# Patient Record
Sex: Female | Born: 1940
Health system: Southern US, Community
[De-identification: ages and names within clinical notes are randomized; demographics above are authoritative.]

## PROBLEM LIST (undated history)

## (undated) DIAGNOSIS — T7840XA Allergy, unspecified, initial encounter: Secondary | ICD-10-CM

## (undated) DIAGNOSIS — I10 Essential (primary) hypertension: Secondary | ICD-10-CM

## (undated) DIAGNOSIS — E785 Hyperlipidemia, unspecified: Secondary | ICD-10-CM

## (undated) HISTORY — PX: REPLACEMENT TOTAL KNEE BILATERAL: SUR1225

## (undated) HISTORY — PX: CATARACT EXTRACTION W/ INTRAOCULAR LENS  IMPLANT, BILATERAL: SHX1307

## (undated) HISTORY — DX: Allergy, unspecified, initial encounter: T78.40XA

## (undated) HISTORY — DX: Essential (primary) hypertension: I10

## (undated) HISTORY — DX: Hyperlipidemia, unspecified: E78.5

---

## 1998-07-08 HISTORY — PX: OTHER SURGICAL HISTORY: SHX169

## 1998-07-19 ENCOUNTER — Encounter: Payer: Self-pay | Admitting: Gynecology

## 1998-07-26 ENCOUNTER — Encounter (INDEPENDENT_AMBULATORY_CARE_PROVIDER_SITE_OTHER): Payer: Self-pay

## 1998-07-26 ENCOUNTER — Ambulatory Visit (HOSPITAL_COMMUNITY): Admission: RE | Admit: 1998-07-26 | Discharge: 1998-07-26 | Payer: Self-pay | Admitting: Gynecology

## 1998-07-28 ENCOUNTER — Encounter: Admission: RE | Admit: 1998-07-28 | Discharge: 1998-10-26 | Payer: Self-pay | Admitting: Radiation Oncology

## 1999-05-23 ENCOUNTER — Encounter: Admission: RE | Admit: 1999-05-23 | Discharge: 1999-05-23 | Payer: Self-pay | Admitting: Gynecology

## 1999-05-23 ENCOUNTER — Encounter: Payer: Self-pay | Admitting: Gynecology

## 1999-06-13 ENCOUNTER — Other Ambulatory Visit: Admission: RE | Admit: 1999-06-13 | Discharge: 1999-06-13 | Payer: Self-pay | Admitting: Gynecology

## 2000-09-24 ENCOUNTER — Other Ambulatory Visit: Admission: RE | Admit: 2000-09-24 | Discharge: 2000-09-24 | Payer: Self-pay | Admitting: Gynecology

## 2000-11-26 ENCOUNTER — Encounter: Admission: RE | Admit: 2000-11-26 | Discharge: 2000-11-26 | Payer: Self-pay | Admitting: Gynecology

## 2000-11-26 ENCOUNTER — Encounter: Payer: Self-pay | Admitting: Gynecology

## 2001-05-14 ENCOUNTER — Ambulatory Visit (HOSPITAL_COMMUNITY): Admission: RE | Admit: 2001-05-14 | Discharge: 2001-05-14 | Payer: Self-pay | Admitting: Ophthalmology

## 2001-09-30 ENCOUNTER — Other Ambulatory Visit: Admission: RE | Admit: 2001-09-30 | Discharge: 2001-09-30 | Payer: Self-pay | Admitting: Gynecology

## 2002-10-27 ENCOUNTER — Other Ambulatory Visit: Admission: RE | Admit: 2002-10-27 | Discharge: 2002-10-27 | Payer: Self-pay | Admitting: Gynecology

## 2003-07-17 ENCOUNTER — Inpatient Hospital Stay (HOSPITAL_COMMUNITY): Admission: RE | Admit: 2003-07-17 | Discharge: 2003-07-22 | Payer: Self-pay | Admitting: Specialist

## 2003-09-28 ENCOUNTER — Encounter: Admission: RE | Admit: 2003-09-28 | Discharge: 2003-09-28 | Payer: Self-pay | Admitting: Gynecology

## 2003-12-21 ENCOUNTER — Other Ambulatory Visit: Admission: RE | Admit: 2003-12-21 | Discharge: 2003-12-21 | Payer: Self-pay | Admitting: Gynecology

## 2004-05-24 ENCOUNTER — Inpatient Hospital Stay (HOSPITAL_COMMUNITY): Admission: RE | Admit: 2004-05-24 | Discharge: 2004-05-27 | Payer: Self-pay | Admitting: Specialist

## 2004-10-04 ENCOUNTER — Encounter: Admission: RE | Admit: 2004-10-04 | Discharge: 2004-10-04 | Payer: Self-pay | Admitting: Endocrinology

## 2005-03-20 ENCOUNTER — Other Ambulatory Visit: Admission: RE | Admit: 2005-03-20 | Discharge: 2005-03-20 | Payer: Self-pay | Admitting: Gynecology

## 2006-04-16 ENCOUNTER — Encounter: Admission: RE | Admit: 2006-04-16 | Discharge: 2006-04-16 | Payer: Self-pay | Admitting: Gynecology

## 2006-06-25 ENCOUNTER — Other Ambulatory Visit: Admission: RE | Admit: 2006-06-25 | Discharge: 2006-06-25 | Payer: Self-pay | Admitting: Gynecology

## 2007-07-08 ENCOUNTER — Encounter: Admission: RE | Admit: 2007-07-08 | Discharge: 2007-07-08 | Payer: Self-pay | Admitting: Endocrinology

## 2008-10-26 ENCOUNTER — Encounter: Admission: RE | Admit: 2008-10-26 | Discharge: 2008-10-26 | Payer: Self-pay | Admitting: Endocrinology

## 2008-11-02 ENCOUNTER — Encounter: Admission: RE | Admit: 2008-11-02 | Discharge: 2008-11-02 | Payer: Self-pay | Admitting: Endocrinology

## 2010-02-26 ENCOUNTER — Encounter: Payer: Self-pay | Admitting: Endocrinology

## 2010-02-28 ENCOUNTER — Encounter: Payer: Self-pay | Admitting: Endocrinology

## 2010-03-07 ENCOUNTER — Encounter
Admission: RE | Admit: 2010-03-07 | Discharge: 2010-03-07 | Payer: PPO | Source: Home / Self Care | Attending: Endocrinology | Admitting: Endocrinology

## 2010-06-24 NOTE — Op Note (Signed)
NAME:  Breanna Shields, FRIDDLE NO.:  [id number]   MEDICAL RECORD NO.:  [id number]                   PATIENT TYPE:  INP   LOCATION:  0006                                 FACILITY:  [facility]   PHYSICIAN:  Erasmo Leventhal, M.D.         DATE OF BIRTH:  Dec 24, 1940   DATE OF PROCEDURE:  07/17/2003  DATE OF DISCHARGE:                                 OPERATIVE REPORT   PREOPERATIVE DIAGNOSIS:  Right knee end-stage osteoarthritis with  osteonecrosis, medial tibial plateau.   POSTOPERATIVE DIAGNOSIS:  Right knee end-stage osteoarthritis with  osteonecrosis, medial tibial plateau.   PROCEDURE:  Total knee arthroplasty.   SURGEON:  Erasmo Leventhal, M.D.   ASSISTANT:  Chapman Moss, PA-C.   ANESTHESIA:  Epidural with monitored anesthesia care.   ESTIMATED BLOOD LOSS:  Less than 50 cc.   DRAINS:  Two medium Hemovacs.   COMPLICATIONS:  None.   TOURNIQUET TIME:  One hour and 30 minutes at 350 mmHg.   COMPLICATIONS:  None.   DISPOSITION:  To the PACU stable.   OPERATIVE IMPLANTS:  Osteonics components, size 7 femur, size 5 tibia.  A 10  mm posterior stabilized tibial insert.  A size 26 mm polyethylene patella.  All cemented posterior stabilized.   OPERATIVE DETAIL:  The patient is counseled in the holding area.  The  correct side was identified.  The IV was started, and antibiotics given.  An  epidural catheter was placed by Dr. __________ .  She was admitted to the  operating room.  Placed in position.  Monitored anesthesia care.  A Foley  catheter was placed utilizing a sterile technique by the OR circulating  nurse.  The right knee was examined.  She had a 5 degree flexion  contracture, and she had range of motion to about 125 degrees.  She was  elevated and prepped and draped in a sterile fashion.  Exsanguinated with  esmarch.  The tourniquet was inflated to 350 mmHg.   A straight midline incision was made in the skin and subcutaneous tissue.  She had a rather thick adipose layer at that point in time.  The wound was  taken down with electrocautery.  Meticulous hemostasis was obtained.  A  medial parapatellar arthrotomy was performed.  The patella was everted, and  the knee was flexed.  A positive medial soft tissue release was done.  She  had end-stage arthritic change with bone-against-bone contact in all three  compartments at the distal point.  Cruciate ligaments were resected.  Starting hole was made through the femur.  The canal was irrigated.  Effluent was clear.  The intramedullary rod was gently placed.  I chose to  take a 10 mm cut off the distal femur, and this was done.  The distal femur  was found to be a size 7.  Rotation wires were made and cut to a size 7.  Tibial osteophytes were removed.  Menisci removed  under direct  visualization.  Significant vessels were coagulated.  Posterior  neurovascular structures were followed out and protected throughout the  entire case.  The proximal tibia is found to be a size 5.  A stepper was  utilized, and the canal was irrigated with effluent until clear.  __________  was gently placed.  I initially took a 0 degree slope and took a 10 mm cut  base from the lateral side; however, at this point, there was still some  bone that appeared to unhealthy on the medial side; therefore, another 2 mm  was taken.  Resecting the area of the osteonecrosis proximally to the medial  tibial plateau back to normal-appearing bone.  There was a small bone cyst  there, and it was curetted to add an extra locking hole for the cement.  Posterior medial and posterior femoral osteophyte were removed under direct  visualization.  A femoral trochlea was prepared in the standard fashion.  At  this time, a size 7 femur and size 5 tibia with a 10 mm flex insert with  excellent range of motion and soft tissue balance on flexion and extension.  Rotational marks were made.  Alignment was excellent.  The depth  of  the  keel was performed at this point in time.  The patella was found to be a  size 26.  It reamed to an appropriate depth.  It was of sufficient  thickness.  Again, it was found to be a size 26.  Locking holes were made.  Excess bone was removed.  The knee was then copiously irrigated with  pulsatile lavage at this point in time while the cement was mixed.  Utilizing model cement technique, all components were cemented into place.  A size 5 tibia, size 7 femur, and a 26 patella.  After the cement had cured,  I did 10 and 12 mm thick trials with a 10 mm tibial insert, posteriorly  stabilized.  We had excellent range of motion on flexion and extension.  She  was well balanced on flexion and extension.  The varus and valgus stress was  nice and stable.  The patellofemoral track was anatomic without a lateral  release.  I was very pleased with the knee at this point in time.  The  tibial trial was removed.  Excess cement was moved from the exposed areas,  and a final 10 mm thick flexed posterior stabilized tibial insert was then  implanted.  Bone wax was placed in the exposed bony surfaces.  The knee was  irrigated again with quite a bit of fluid, including antibiotic solution.  Two mini-vac drains were placed.  The sequential closure layers were done.  Arthrotomy Vicryl, subcu Vicryl, and the skin was closed with subcuticular  Monocryl suture.  Steri-Strips were applied.  Gelfoam around the drain.  Sterile compressive dressing applied.  The tourniquet was deflated.  Normal  circulation in the foot and ankle at the end of the case.  Drains were  hooked to suction.  Given another gram of Ancef intravenously.  Gently  awakened in the operating room and taken to the PACU in stable condition.  Patient  tolerated the procedure well.  There were no complications.  Sponge  and needle instrument counts were correct.  Decreased surgical time helped out the entire surgical procedure.  Mr.  Viviann Spare  Chapin's assistance was needed.  Erasmo Leventhal, M.D.    RAC/MEDQ  D:  07/17/2003  T:  07/17/2003  Job:  [id number]

## 2010-06-24 NOTE — Op Note (Signed)
NAMEWEDNESDAY, ERICSSON NO.:  [id number]   MEDICAL RECORD NO.:  [id number]          PATIENT TYPE:  INP   LOCATION:  0006                         FACILITY:  [facility]   PHYSICIAN:  Erasmo Leventhal, M.D.DATE OF BIRTH:  09/14/1940   DATE OF PROCEDURE:  05/24/2004  DATE OF DISCHARGE:                                 OPERATIVE REPORT   PREOPERATIVE DIAGNOSIS:  Left knee end-stage osteoarthritis.   POSTOPERATIVE DIAGNOSIS:  Left knee end-stage osteoarthritis.   PROCEDURE:  Left total knee arthroplasty.   SURGEON:  Erasmo Leventhal, M.D.   ASSISTANT:  Jaquelyn Bitter. Chabon, PA-C.   ANESTHESIA:  Femoral nerve block with spinal.   ESTIMATED BLOOD LOSS:  Less than 100 cc.   DRAINS:  Two medium Hemovac.   COMPLICATIONS:  None.   TOURNIQUET TIME:  One hour and 50 minutes at 350 mmHg.   OPERATIVE IMPLANTS:  [employer], [place], rotating platform, a size 3  femur, size 2.5 tibia, 10 mm rotating platform, posterior stabilized tibial  insert, and a 32 mm patella, all cemented.   OPERATIVE DETAILS:  The patient was counseled in the holding area.  The  correct side was identified.  IV was started.  Antibiotics were given.  Taken to OR where the femoral nerve block was administered by the  anesthesiologist in the holding area.  She was taken to the operating room,  where spinal anesthetic was now administered.  A Foley catheter was placed  utilizing sterile technique by one of the circulating nurses.  All  extremities were well padded.  The left knee was examined.  She had about a  10 degree flexion contracture.  This was flexed to 115 degrees.  Elevated.  Prepped with Duraprep.  Draped through a sterile fashion.  Exsanguinated  with esmarch.  The tourniquet was inflated to 350 mmHg.  A straight and  midline incision made through the skin and subcutaneous tissues.  Small  veins were electrocoagulated.  Medial and lateral soft tissue flaps were  developed at the  appropriate level.  Medial parapatellar arthrotomy was  performed.  A proximal medial soft tissue release was done.  The medial and  lateral menisci were removed under direct visualization using  electrocautery.  __________  vessels were coagulated.  The posterior  neurovascular structures were tied off and protected throughout the entire  case.  She had end-stage arthritic change, bone-against-bone contacts.  Cruciate ligaments were resected.  Starting hole made in the distal femur.  The canal was irrigated.  Effluent was clear.  Intramedullary rod was gently  placed.  I chose a 5 degree valgus cut.  Made an 11 mm cut off the distal  femur to compensate reflection contracture.  The distal femur was found to  be a size 3.  Rotational marks were made.  The cutting block was applied.  The distal femur was cut and fitted to a size #3.  The tibial eminence was  resected.  Osteophytes were removed in the proximal middle tibia.  The  proximal tibia was found to be a size 2.5.  A center starting hole  was made.  A step reamer was utilized.  The canal is irrigated until the effluent was  clear.  The intramedullary rod was gently placed.  I chose a 10 mm cut based  upon the lateral thigh, which was the high side.  The posterior medial and  posterolateral femoral osteophytes were removed under direct visualization.  The femoral box was then prepared.  At this time, the size 3 femur and size  2.5 tibia, were tightened, flexion and extension; therefore, the flex trial  was removed.  We took another 2 mm off the proximal tibia.  At this time,  again with a size 3 femur, size 2.5 tibia with a 10 mm insert with excellent  range of motion and soft tissue balance of flexion and extension.  Well  pleased at this time.  The patella was found to be 22 mm thick.  We took off  8 mm resection.  Found to be a size 32.  Locking holes were made at this  time with a size 32 patella.  We had excellent range of motion.   The  patellofemoral tracking was anatomic.  All trials were then removed.  The  tibial punch and keel were then performed.  Utilizing __________ , all  components were cemented into place.  The knee was copiously irrigated with  pulsatile lavage.  The cement was mixed on the back table.  At this time, we  cemented in a size 2.5 rotating platform tibia, a size 3 posterior  stabilized femur with a 32 mm patella.  After the cement had cured, the  excess cement was removed.  With a 10 mm trial, we had excellent flexion and  extension gaps, well balanced with varus and valgus stress, and  patellofemoral tracking was anatomic.  The trial was then removed.  Excess  cement was removed.  A final 10 mm posterior stabilized rotating platform  tibial insert was then planted.  The knee was well balanced.  There was no  spinout at all.  She was well constrained in the AP and lateral planes, and  patellofemoral tracking was anatomic.  The knee was copiously irrigated.  Two medium Hemovac drains were placed and left intra-articular.  __________  sequential closure of layers was done.  Arthrotomy Vicryl.  Subcu Vicryl.  Skin closed with subcu micro suture.  Knee joint and soft tissue irrigated  with saline during the closure of each layer.  Sterile dressing was applied.  The tourniquet was deflated, then 1 gm of Ancef was given intravenously.  A  sterile compressive dressing was applied.  She had normal circulation of the  foot and ankle at the end of the case.  Tolerated the procedure well.  There  were no complications.  She was then gently taken to the operating room and  PACU in stable condition.  There were no complications or problems.  Sponge  and needle count were correct.   To help throughout this difficult procedure, to decrease surgical time, help  with surgical accuracy and technique, decision-making, and leg positioning, Mr. Jodene Nam, PA-C's assistance was needed throughout the entire  case.      RAC/MEDQ  D:  05/24/2004  T:  05/24/2004  Job:  [id number]

## 2010-06-24 NOTE — H&P (Signed)
NAMEANNALIZE, CLEGHORN NO.:  [id number]   MEDICAL RECORD NO.:  [id number]           PATIENT TYPE:   LOCATION:                                 FACILITY:   PHYSICIAN:  Cynda Familia, M.D.DATE OF BIRTH:  [birth year]   DATE OF ADMISSION:  05/24/2004  DATE OF DISCHARGE:                                HISTORY & PHYSICAL   CHIEF COMPLAINT:  Osteoarthritis, left knee.   BRIEF HISTORY:  This is a 70 year old female well-known to [place] with previous  history of right total knee arthroplasty with good result, who has continued  pain and difficulty utilizing her left leg secondary to end-stage  osteoarthritis.  She has failed conservative measures and after discussion  of treatment benefits, risks and options, the patient is now scheduled for a  total knee arthroplasty of the left knee.  The patient has had medical  clearance by her cardiologist, Dr. Wynonia Lawman, and by her medical doctor, Dr.  Elyse Hsu.   PAST MEDICAL HISTORY:  Drug allergies:  None.   Current medications:  1.  Actos 30 mg one q.a.m.  2.  Lipitor 40 mg one q.a.m.  3.  Aspirin one daily.  4.  Actifed two a day for sinuses.   Previous surgeries include right total knee arthroplasty and arthroscopic  surgery of the right knee.   Serious medical illnesses include diabetes and hypercholesterolemia.   FAMILY HISTORY:  Positive for coronary artery disease and diabetes.   SOCIAL HISTORY:  The patient is married.  She works as a Theme park manager.  She  smokes one pack a day and drinks occasionally.   REVIEW OF SYSTEMS:  CENTRAL NERVOUS SYSTEM:  Positive for occasional sinus  headache.  Negative for headache, blurred vision or dizziness otherwise.  PULMONARY:  Positive for exertional shortness of breath.  Negative for PND  and orthopnea.  CARDIOVASCULAR:  No chest pain or palpitations.  GASTROINTESTINAL:  Negative for ulcers or hepatitis.  GENITOURINARY:  Negative for urinary tract difficulty.   MUSCULOSKELETAL:  Positive as in  HPI.   PHYSICAL EXAMINATION:  VITAL SIGNS:  BP 130/86, respirations 17, pulse 82  and regular.  GENERAL APPEARANCE:  This is a well-developed, well-nourished lady in  moderate distress with the left knee.  HEENT:  Head normocephalic.  Nose patent.  Ears patent.  Pupils equal,  round, and reactive to light.  Throat without injection.  NECK:  Supple without adenopathy.  Carotids 2+ without bruit.  CHEST:  Clear to auscultation.  No rales or rhonchi.  Respirations 17.  CARDIAC:  Regular rate and rhythm at 82 beats per minute without murmur.  ABDOMEN:  Soft with active bowel sounds.  No masses or organomegaly.  NEUROLOGIC:  Patient alert and oriented to time, place and person.  Cranial  nerves II-XII grossly intact.  EXTREMITIES:  The right knee is status post total knee arthroplasty with 0-  110 degree range of motion with good stability.  The left knee shows a 5  degree flexion contracture with further flexion to 120.  Dorsalis pedis and  posterior tibialis pulses are 2+.   X-rays show  end-stage osteoarthritis, left knee.   IMPRESSION:  End-stage osteoarthritis, left knee.   PLAN:  Total knee arthroplasty, left knee.      ________________________________________  Judith Part. Chabon, P.A.  ___________________________________________  Cynda Familia, M.D.    SJC/MEDQ  D:  05/09/2004  T:  05/09/2004  Job:  [id number]

## 2010-06-24 NOTE — Discharge Summary (Signed)
NAME:  Breanna Shields, Breanna Shields NO.:  [id number]   MEDICAL RECORD NO.:  [id number]                   PATIENT TYPE:  INP   LOCATION:  0481                                 FACILITY:  [facility]   PHYSICIAN:  Erasmo Leventhal, M.D.         DATE OF BIRTH:  [birth year]   DATE OF ADMISSION:  07/17/2003  DATE OF DISCHARGE:  07/22/2003                                 DISCHARGE SUMMARY   ADMISSION DIAGNOSIS:  Osteoarthritis, right knee.   DISCHARGE DIAGNOSIS:  Osteoarthritis, right knee.   OPERATION:  Right knee arthroscopy.   HISTORY:  This is a 70 year old lady with a history of end-stage  osteoarthritis of the right knee which has failed conservative treatment.  After discussion of the treatment, risks, benefits, and options, the patient  is now scheduled for a total knee arthroplasty.  Questions are invited and  answered.  She had preoperative clearance by her medical doctor, Dr.  Leslie Dales and Dr. Donnie Aho.   LABORATORY VALUES:  Admission CBC within normal limits.  Admission CMET  within normal limits with the exception of elevated glucose at 132, TSH  0.747.  Hemoglobin and hematocrit reached a low of 11.2 and 32.9 on the  13th.  Admission PT/PTT within normal limits.  By discharge, it was 18.7  with an INR of 1.9.  Her BMET remained within normal limits throughout the  hospitalization with the exception of elevated glucoses.   HOSPITAL COURSE:  Patient tolerated the operative procedure well.  Medical  consults were obtained postop for management of hypertension.   The first postoperative day, vital signs were stable.  Patient is afebrile.  Neurovascular status was intact.  Hemovac was removed without difficulty.  CPM was started.   The second postoperative day, the epidural was removed intact.  The  patient's vital signs were stable.  She was afebrile.  Dressing was changed.  The wound was benign.  She was started in therapy.   On the third postoperative  day, she was feeling good.  She had a mild  tachycardia.  O2 sats were 95% on room air.  Labs were within acceptable  range.  The dressing was changed.  The wound was benign.  Calves were  negative.  Lungs were clear.  Also noted was an occasional irregular beat  with tachycardia.  We asked medical service to evaluate that.  TSH was  ordered, as was an EKG, which showed occasional PVCs.   On the fourth postoperative day, she is feeling better.  Vital signs are  stable.  She is afebrile.  Tachycardia is better.  TSH is within normal  limits.  Dressing was changed.  There was mild redness about the wound.  Medical consult with cardiology was obtained to evaluate the PVCs, as this  was a new finding from previous EKG, and this was felt to represent benign  PVCs, and no other treatment was necessary.   On the fifth postoperative day, improved  condition with vital signs stable,  afebrile.  Wound benign.  Lungs clear. Calves negative.  Patient was  discharged home for followup in the office.   CONDITION ON DISCHARGE:  Improved.   DISCHARGE MEDICATIONS:  Percocet, Robaxin, Coumadin, and Trinsicon.   DISCHARGE INSTRUCTIONS:  She is to do her home physical therapy.  Take her  medications as directed.  Return to the office in 10 days or call sooner  p.r.n. problems.     Jaquelyn Bitter. Chabon, P.A.                   Erasmo Leventhal, M.D.    SJC/MEDQ  D:  07/31/2003  T:  07/31/2003  Job:  [id number]

## 2010-06-24 NOTE — H&P (Signed)
NAME:  Breanna Shields, Petion NO.:  [id number]   MEDICAL RECORD NO.:  [id number]                   PATIENT TYPE:   LOCATION:                                       FACILITY:   PHYSICIAN:  Erasmo Leventhal, M.D.         DATE OF BIRTH:  April 18, 1940   DATE OF ADMISSION:  07/17/2003  DATE OF DISCHARGE:                                HISTORY & PHYSICAL   CHIEF COMPLAINT:  Right knee end-stage osteoarthritis.   HISTORY OF PRESENT ILLNESS:  This is a 70 year old lady with a history of  end-stage osteoarthritis of her right knee who has failed conservative  treatment. After discussion with treatment, risks, benefits, and options,  the patient is now scheduled for total knee arthroplasty of the right knee.  She understands the risks of DVT, stiffness, infection, loosening, and  potential blood loss with risks of transfusions. Again, questions were  invited and answered, and surgery is to go ahead as scheduled. She has had  preoperative medical clearance by Dr. Leslie Dales and Dr. Donnie Aho.   PAST MEDICAL HISTORY:  Drug allergies none. Medical illnesses include  hypercholesterolemia and diabetes.   CURRENT MEDICATIONS:  1. Lipitor 40 mg q.a.m.  2. Actos 30 mg q.a.m.  3. Actifed one b.i.d.  4. Multivitamin and calcium.  5. Aspirin 81 mg daily. She is to stop that one week prior to surgery.   PAST SURGICAL HISTORY:  1. Arthroscopic of bilateral knees.  2. Bunionectomy of right foot.   SOCIAL HISTORY:  The patient is married. She works as a Tree surgeon. She  smokes one and a half packs per day and drinks occasionally.   FAMILY HISTORY:  Positive for coronary artery disease, diabetes, and cancer.   REVIEW OF SYSTEMS:  CENTRAL NERVOUS SYSTEM: Negative for headache, blurred  vision, or dizziness. Positive for decreased hearing in the left ear and  recent tinnitus in the left ear.  She is on no anti-inflammatories or  aspirin (high doses) that would account for  that. She is to discuss this  with Dr. Leslie Dales at her  appointment on Monday.  PULMONARY: Negative for  shortness of breath, PND, or orthopnea.  CARDIOVASCULAR: Negative for chest  pain or palpitations. GI: Negative for ulcers or hepatitis. GU: Negative for  urinary tract difficulty. MUSCULOSKELETAL: Positive as in history of present  illness.   PHYSICAL EXAMINATION:  VITAL SIGNS: Pulse 84 and regular, respirations 20,  blood pressure 155/88.  GENERAL APPEARANCE: This is a well-developed, well-nourished lady in no  acute distress.  HEENT:  The head is normocephalic and atraumatic. Nose patent. Ears reveals  she uses a hearing aid in the left ear. The right ear is normal. The left  ear has a little excess cerumen in the canal. Otherwise, no abnormalities.  NECK: Supple without adenopathy. Carotid 2+ without bruits.  CHEST: Clear to auscultation.  No rales or rhonchi. Respirations 20.  HEART: Regular rate and rhythm at 84  beats per minute without murmurs.  ABDOMEN: Soft with active bowel sounds. No masses or organomegaly.  NEUROLOGIC: The patient is alert and oriented to time, place, and person.  Cranial nerves II-XII grossly intact.  EXTREMITIES: The right knee has 0-120 degree range of motion, crepitation  throughout motion, relatively normal clinical alignment with dorsalis pedis  and posterior tibialis pulses 2+. Sensation is normal.   X-rays show end-stage osteoarthritis, right knee. The patient also has  ongoing osteonecrosis in her right knee, medial compartment.   ASSESSMENT:  End-stage osteoarthritis, right knee.   PLAN:  Total knee arthroplasty of right knee.     Jaquelyn Bitter. Chabon, P.A.                   Erasmo Leventhal, M.D.    SJC/MEDQ  D:  07/09/2003  T:  07/09/2003  Job:  [id number]

## 2010-06-24 NOTE — Op Note (Signed)
[place]  Patient:    Breanna Shields, Breanna Shields Visit Number: [id number] MRN: [id number]          Service Type: DSU Location: [facility] 2867 [id number] Attending Physician:  Tommy Medal Dictated by:   Doris Cheadle. Dione Booze, M.D. Proc. Date: 05/14/01 Admit Date:  05/14/2001 Discharge Date: 05/14/2001   CC:         Clarene Critchley, M.D.  Veverly Fells. Altheimer, M.D.   Operative Report  INDICATIONS AND JUSTIFICATIONS FOR PROCEDURE:  Andilyn Bettcher was first seen in my office on August 02, 2000. At that time was referred regarding cataracts by Dr. Clarene Critchley, her regular optometrist.  She is followed medically by Dr. Casimiro Needle Altheimer.  Her vision was 20/100 to about 20/200 in the right eye, and was 20/25 in the left.  She had an uncomplicated right cataract extraction with implant, and has done well with return of almost 20/20 vision.  Also, coincidentally, unfortunately, she had very deep optic nerve cupping compatible with glaucoma, and has had visual field testing and other studies evaluating her for this.  She is not presently taking medication for glaucoma, but may well have the disease. Finally, she had an enormous amount of redundant skin of each upper eyelid, and was complaining that this bothered her visually.  Actually the skin not only covers her eyelashes, but comes to the edge of each pupil.  She reports that she can feel the weight of the skin and that it blocks the upper field of vision, and that it causes fatigue.  Otherwise the pupils motility, conjunctivae, cornea, anterior chamber and fundus exams are negative except for the deep optic nerve cupping.  In a slit lamp she now has a lens implant in the right eye, and has an early left cataract.  She has been followed a number of times since her original surgery, and on March 21, 2001 came back specifically with the right corneal abrasion.  But, also wanted to discuss upper eyelid  blepharoplasty.  Examination again showed a large amount of redundant skin, and this was documented with photographs and visual fields.  The visual field does show a large reduction of vision when the skin is taped, compared to when it is not taped.  The skin causes her about 20 degrees of good superior fields.  Medically she should be stable for this procedure.  She has been diabetic since April 2002.  Justification to perform procedures in the outpatient setting is retained.  JUSTIFICATIONAL STUDIES:  None.  PREOPERATIVE DIAGNOSIS:  Blepharoclosus with vision impairment.  POSTOPERATIVE DIAGNOSIS:  Blephoraclosus with vision impairment.  SURGEON:  Robert L. Dione Booze, M.D.  ANESTHESIA:  1% Xylocaine with epinephrine.  PROCEDURE:  Upper eyelid optical blepharoplasty.  DESCRIPTION OF PROCEDURE: The patient arrived in the minor surgery room at [facility] and was prepped and draped in the routine fashion.  A frontal nerve block was given under each upper lid, and additional Xylocaine was given in the skin of the lids.  The skin to be removed was carefully demarcated and then excised, along with some underlying fatty tissue. Bleeding was controlled with a battery operated cautery.  After this each wound was closed with a running 6-0 nylon suture, and pressure patches were applied.  The patient then left the minor room, having done nicely.  FOLLOW-UP CARE:  The patient will be seen in my office in five to six days to have the sutures removed.  She is to remove the  patches in four or five hours. She is to use warm compresses and is to use Polysporin ointment in her eyes at night. Dictated by:   Doris Cheadle. Dione Booze, M.D. Attending Physician:  Tommy Medal DD:  05/16/01 TD:  05/17/01 Job: [id number]

## 2010-06-24 NOTE — Discharge Summary (Signed)
NAMECATALINA, Breanna Shields NO.:  [id number]   MEDICAL RECORD NO.:  [id number]          PATIENT TYPE:  INP   LOCATION:  0477                         FACILITY[facility]  [facility]   PHYSICIAN:  Breanna Shields, M.D.DATE OF BIRTH:  07-Apr-1940   DATE OF ADMISSION:  05/24/2004  DATE OF DISCHARGE:  05/27/2004                                 DISCHARGE SUMMARY   ADMISSION DIAGNOSIS:  End-stage osteoarthritis, left knee.   DISCHARGE DIAGNOSIS:  End-stage osteoarthritis, left knee.   OPERATION:  Total knee arthroplasty, left knee.   BRIEF HISTORY:  This 70 year old female known to use well with history of  right total knee arthroplasty with good result who had end-stage  osteoarthritis of her left knee with failure of conservative management.  After discussion of the treatment benefits, risks and options, the patient  now scheduled for total knee arthroplasty, left knee.   LABORATORY DATA:  Admission CBC within normal limits, hemoglobin and  hematocrit reached a low of 10.1 and 29.8. She had a mild elevated white  count on the 19th which was resolved by the 21st. PT and PTT within normal  limits. At discharge, PT was 24.5 with INR of 3.1. This was managed by the  pharmacy. Admission CMET within normal limits, potassium was mildly elevated  to 5.2 on the 20th and was back down to normal on the 21st. She ran mildly  elevated glucose with a high of 166 throughout admission.   HOSPITAL COURSE:  The patient tolerated the operative procedure well. The  first postoperative day, vital signs stable, afebrile, hemoglobin and  hematocrit were stable. WBC was elevated at 13.3, I&O's were good, lungs  were clear, bowel sounds sluggish, heart sounds normal, dressing was dry,  drain was removed without difficulty. Incentive spirometry q.1 h and cough  and deep breathe was initiated as this was probably the cause of her  elevated white count. The second postoperative day she was feeling better,  vital signs were stable, she had a mild tachycardia, hemoglobin and  hematocrit were stable, WBC was back down to 12.4. BMET showed the potassium  still at 5.2, glucose 144, lungs clear, bowel sounds sluggish, heart sounds  normal. Calves were negative, dressing was changed, wound was benign.  Discharge plan was made for Saturday or Sunday. The third postoperative day,  the patient was doing better, doing well in physical therapy, vital signs  are stable, afebrile. Labs were stable. Neurologic status was normal and IV  was discontinued and plan was made for discharge home the following day. On  April 22 in improved condition with vital signs stable, the patient  afebrile, PT/INR slightly elevated above what we would like to run it, the  patient was discharged home for followup in the office with a repeat blood  draw PT/INR on May 28, 2004. This will be managed by [facility] pharmacy.   CONDITION ON DISCHARGE:  Improved.   DISCHARGE MEDICATIONS:  1.  Percocet 1-2 q.4-6 h p.r.n. pain.  2.  Robaxin 500, 1 p.o. q.8 h p.r.n. spasm.  3.  Trinsicon one twice a day for anemia.  4.  Coumadin as directed by pharmacist but do not take any Coumadin until      instructed by [facility].   Return to the office in 10 days.      SJC/MEDQ  D:  06/17/2004  T:  06/17/2004  Job:  [id number]

## 2010-11-10 ENCOUNTER — Other Ambulatory Visit: Payer: Self-pay | Admitting: Endocrinology

## 2010-11-10 DIAGNOSIS — Z1231 Encounter for screening mammogram for malignant neoplasm of breast: Secondary | ICD-10-CM

## 2010-12-20 ENCOUNTER — Encounter: Payer: Self-pay | Admitting: Gastroenterology

## 2011-01-09 ENCOUNTER — Telehealth: Payer: Self-pay | Admitting: *Deleted

## 2011-01-09 ENCOUNTER — Ambulatory Visit (AMBULATORY_SURGERY_CENTER): Payer: No Typology Code available for payment source | Admitting: *Deleted

## 2011-01-09 VITALS — Ht 63.0 in | Wt 234.0 lb

## 2011-01-09 DIAGNOSIS — Z1211 Encounter for screening for malignant neoplasm of colon: Secondary | ICD-10-CM

## 2011-01-09 MED ORDER — PEG-KCL-NACL-NASULF-NA ASC-C 100 G PO SOLR: ORAL | Status: DC

## 2011-01-09 NOTE — Telephone Encounter (Signed)
Release faxed to Dr Kinnie Scales

## 2011-01-09 NOTE — Telephone Encounter (Signed)
Medical Record release given to Chales Abrahams.  Pt to have colon 01/16/11

## 2011-01-13 ENCOUNTER — Telehealth: Payer: Self-pay

## 2011-01-13 NOTE — Telephone Encounter (Signed)
I have tried to reach the pt on several occasions throughout the afternoon and have been unable to reach her.  I left a detailed message that the colon has been cx for Monday and she should call the office to discuss.

## 2011-01-13 NOTE — Telephone Encounter (Signed)
Left message on machine to call back   Dr Christella Hartigan reviewed the previous colon and the pt does not need the colon at this time He will be happy to see her in the office to discuss.

## 2011-01-14 ENCOUNTER — Telehealth: Payer: Self-pay | Admitting: Internal Medicine

## 2011-01-14 NOTE — Telephone Encounter (Signed)
Ms. Meulemans called and said she got a message that colonoscopy scheduled for Monday with Dr. Christella Hartigan has been canceled. She notes, the message on her machine said "[name]" which led to confusion for her. I reviewed the chart and I see where Chales Abrahams did call to cancel the colonoscopy for Breanna Shields. I advised she not perform the prep and to contact the office early in the week for clarification and recommendations on when she will be due for procedure. She was satisfied with this plan.

## 2011-01-16 ENCOUNTER — Other Ambulatory Visit: Payer: No Typology Code available for payment source | Admitting: Gastroenterology

## 2011-01-16 NOTE — Telephone Encounter (Signed)
Left message on machine to call back  

## 2011-01-16 NOTE — Telephone Encounter (Signed)
Pt aware and does not want to be scheduled to see Dr Christella Hartigan to discuss, she will call with any problems

## 2011-01-16 NOTE — Telephone Encounter (Signed)
Can you call her today.  i'm happy to see her in office to discuss further if she wants.

## 2011-03-10 ENCOUNTER — Ambulatory Visit: Payer: Self-pay

## 2011-03-13 ENCOUNTER — Ambulatory Visit
Admission: RE | Admit: 2011-03-13 | Discharge: 2011-03-13 | Disposition: A | Discharge status: Home / Self Care | Payer: Medicare Other | Source: Ambulatory Visit | Attending: Endocrinology | Admitting: Endocrinology

## 2011-03-13 DIAGNOSIS — Z1231 Encounter for screening mammogram for malignant neoplasm of breast: Secondary | ICD-10-CM

## 2011-03-27 ENCOUNTER — Other Ambulatory Visit: Payer: Self-pay | Admitting: Endocrinology

## 2011-03-27 ENCOUNTER — Ambulatory Visit
Admission: RE | Admit: 2011-03-27 | Discharge: 2011-03-27 | Disposition: A | Discharge status: Home / Self Care | Payer: Medicare Other | Source: Ambulatory Visit | Attending: Endocrinology | Admitting: Endocrinology

## 2011-03-27 DIAGNOSIS — J42 Unspecified chronic bronchitis: Secondary | ICD-10-CM

## 2011-03-27 DIAGNOSIS — J209 Acute bronchitis, unspecified: Secondary | ICD-10-CM

## 2011-04-17 ENCOUNTER — Encounter: Payer: Self-pay | Admitting: Pulmonary Disease

## 2011-04-17 ENCOUNTER — Ambulatory Visit (INDEPENDENT_AMBULATORY_CARE_PROVIDER_SITE_OTHER): Payer: Medicare Other | Admitting: Pulmonary Disease

## 2011-04-17 VITALS — BP 140/82 | HR 80 | Temp 98.2°F | Ht 63.0 in | Wt 232.4 lb

## 2011-04-17 DIAGNOSIS — R0600 Dyspnea, unspecified: Secondary | ICD-10-CM | POA: Insufficient documentation

## 2011-04-17 DIAGNOSIS — R0609 Other forms of dyspnea: Secondary | ICD-10-CM

## 2011-04-17 NOTE — Assessment & Plan Note (Signed)
The patient has multiple pulmonary complaints of cough, mucus, and dyspnea on exertion.  She has a long history of smoking, and continues to do so.  She also is morbidly obese and deconditioned.  It is unclear whether she may have COPD, and would like to do pulmonary function studies for documentation.  Ultimately, smoking cessation is the key to her cough and mucus production resolving.  These will not improve if she continues to smoke, and I have passed this information on to her.

## 2011-04-17 NOTE — Patient Instructions (Signed)
Stop smoking.  This is the only way to get your cough and mucus to go away Will schedule for breathing studies, and see you back the same day for review.

## 2011-04-17 NOTE — Progress Notes (Signed)
  Subjective:    Patient ID: Breanna Shields, female    DOB: 10-Dec-1940, 71 y.o.   MRN: [id number]  HPI The patient is a 71 year old female who had been asked to see for ongoing cough and shortness of breath.  She has a long history of smoking, and continues to do so.  She has had a cough for 5-6 month duration which produces mucus intermittently, and this can range from clear to green in color.  Patient states that she coughed mucus up every single day.  She describes a one block dyspnea on exertion at a moderate pace, and will get winded bringing groceries in from the car.  She does not have shortness of breath with light housework.  She does have lower extremity edema, but no history of cardiac disease.  She has had a chest x-ray last month which only showed prominent bronchovascular markings.   Review of Systems  Constitutional: Positive for unexpected weight change. Negative for fever.  HENT: Positive for rhinorrhea, postnasal drip and sinus pressure. Negative for ear pain, nosebleeds, congestion, sore throat, sneezing, trouble swallowing and dental problem.   Eyes: Negative for redness and itching.  Respiratory: Positive for cough, shortness of breath and wheezing. Negative for chest tightness.   Cardiovascular: Positive for leg swelling. Negative for palpitations.  Gastrointestinal: Negative for nausea and vomiting.  Genitourinary: Negative for dysuria.  Musculoskeletal: Positive for joint swelling.  Skin: Negative for rash.  Neurological: Negative for headaches.  Hematological: Does not bruise/bleed easily.  Psychiatric/Behavioral: Negative for dysphoric mood. The patient is not nervous/anxious.        Objective:   Physical Exam Constitutional:  Obese female, no acute distress  HENT:  Nares very narrowed, no purulence noted.   Oropharynx without exudate, palate and uvula are elongated  Eyes:  Perrla, eomi, no scleral icterus  Neck:  No JVD, no TMG  Cardiovascular:  Normal  rate, regular rhythm, no rubs or gallops.  2/6 sem        Intact distal pulses  Pulmonary :  decreased breath sounds, no stridor or respiratory distress   No rales, rhonchi, or wheezing  Abdominal:  Soft, nondistended, bowel sounds present.  No tenderness noted.   Musculoskeletal: 1+ lower extremity edema noted.  Lymph Nodes:  No cervical lymphadenopathy noted  Skin:  No cyanosis noted  Neurologic:  Alert, appropriate, moves all 4 extremities without obvious deficit.         Assessment & Plan:

## 2011-04-17 NOTE — Progress Notes (Signed)
Addended by: Ozella Almond R on: 04/17/2011 10:27 AM   Modules accepted: Orders

## 2011-05-22 ENCOUNTER — Ambulatory Visit (INDEPENDENT_AMBULATORY_CARE_PROVIDER_SITE_OTHER): Payer: Medicare Other | Admitting: Pulmonary Disease

## 2011-05-22 ENCOUNTER — Encounter: Payer: Self-pay | Admitting: Pulmonary Disease

## 2011-05-22 VITALS — BP 122/70 | HR 79 | Temp 97.6°F | Ht 63.0 in | Wt 230.0 lb

## 2011-05-22 DIAGNOSIS — R0600 Dyspnea, unspecified: Secondary | ICD-10-CM

## 2011-05-22 DIAGNOSIS — R0609 Other forms of dyspnea: Secondary | ICD-10-CM

## 2011-05-22 LAB — PULMONARY FUNCTION TEST

## 2011-05-22 NOTE — Assessment & Plan Note (Signed)
The patient's pulmonary function studies are essentially normal, and therefore I suspect her dyspnea on exertion is due to her weight and conditioning.  I have asked her to stop smoking 100%, and this will probably resolve her cough and mucus production.  I've also asked her to start on some type of weight and conditioning program.

## 2011-05-22 NOTE — Progress Notes (Signed)
PFT done today. 

## 2011-05-22 NOTE — Patient Instructions (Signed)
You do not have any copd by your breathing test, and no other abnormality Work on weight loss and conditioning. Stop smoking!! This is the key to stopping your cough and mucus followup with me as needed.

## 2011-05-22 NOTE — Progress Notes (Signed)
  Subjective:    Patient ID: Breanna Shields, female    DOB: 11/30/1940, 71 y.o.   MRN: [id number]  HPI The patient comes in today for followup after her recent PFTs, ordered as part of a workup for dyspnea on exertion.  She was found to have no airflow obstruction, no restriction, and minimal reduction in diffusion capacity.  I have reviewed the study with her in detail, and answered all of her questions.   Review of Systems  Constitutional: Negative.  Negative for fever and unexpected weight change.  HENT: Positive for postnasal drip and sinus pressure. Negative for ear pain, nosebleeds, congestion, sore throat, rhinorrhea, sneezing, trouble swallowing and dental problem.   Eyes: Negative.  Negative for redness and itching.  Respiratory: Positive for cough. Negative for chest tightness, shortness of breath and wheezing.   Cardiovascular: Negative.  Negative for palpitations and leg swelling.  Gastrointestinal: Negative.  Negative for nausea and vomiting.  Genitourinary: Negative.  Negative for dysuria.  Musculoskeletal: Negative.  Negative for joint swelling.  Skin: Negative for rash.  Neurological: Negative.  Negative for headaches.  Hematological: Negative.  Does not bruise/bleed easily.  Psychiatric/Behavioral: Negative.  Negative for dysphoric mood. The patient is not nervous/anxious.        Objective:   Physical Exam Obese female in no acute distress Nose without purulent discharge noted Lower extremities with mild edema, no cyanosis Alert and oriented, moves all 4 extremities.       Assessment & Plan:

## 2012-04-22 ENCOUNTER — Other Ambulatory Visit: Payer: Self-pay

## 2012-04-22 DIAGNOSIS — Z1231 Encounter for screening mammogram for malignant neoplasm of breast: Secondary | ICD-10-CM

## 2012-05-20 ENCOUNTER — Ambulatory Visit
Admission: RE | Admit: 2012-05-20 | Discharge: 2012-05-20 | Disposition: A | Discharge status: Home / Self Care | Payer: Medicare Other | Source: Ambulatory Visit

## 2012-05-20 DIAGNOSIS — Z1231 Encounter for screening mammogram for malignant neoplasm of breast: Secondary | ICD-10-CM

## 2014-04-13 ENCOUNTER — Other Ambulatory Visit: Payer: Self-pay

## 2014-04-13 DIAGNOSIS — Z1231 Encounter for screening mammogram for malignant neoplasm of breast: Secondary | ICD-10-CM

## 2014-04-20 ENCOUNTER — Ambulatory Visit
Admission: RE | Admit: 2014-04-20 | Discharge: 2014-04-20 | Disposition: A | Discharge status: Home / Self Care | Payer: PPO | Source: Ambulatory Visit

## 2014-04-20 DIAGNOSIS — Z1231 Encounter for screening mammogram for malignant neoplasm of breast: Secondary | ICD-10-CM

## 2015-03-04 DIAGNOSIS — Z961 Presence of intraocular lens: Secondary | ICD-10-CM | POA: Diagnosis not present

## 2015-03-04 DIAGNOSIS — H40013 Open angle with borderline findings, low risk, bilateral: Secondary | ICD-10-CM | POA: Diagnosis not present

## 2015-03-04 DIAGNOSIS — E119 Type 2 diabetes mellitus without complications: Secondary | ICD-10-CM | POA: Diagnosis not present

## 2015-04-12 DIAGNOSIS — I10 Essential (primary) hypertension: Secondary | ICD-10-CM | POA: Diagnosis not present

## 2015-04-12 DIAGNOSIS — E782 Mixed hyperlipidemia: Secondary | ICD-10-CM | POA: Diagnosis not present

## 2015-04-12 DIAGNOSIS — E119 Type 2 diabetes mellitus without complications: Secondary | ICD-10-CM | POA: Diagnosis not present

## 2015-04-12 DIAGNOSIS — E785 Hyperlipidemia, unspecified: Secondary | ICD-10-CM | POA: Diagnosis not present

## 2015-04-12 DIAGNOSIS — Z8249 Family history of ischemic heart disease and other diseases of the circulatory system: Secondary | ICD-10-CM | POA: Diagnosis not present

## 2015-04-12 DIAGNOSIS — R0600 Dyspnea, unspecified: Secondary | ICD-10-CM | POA: Diagnosis not present

## 2015-04-12 DIAGNOSIS — E559 Vitamin D deficiency, unspecified: Secondary | ICD-10-CM | POA: Diagnosis not present

## 2015-04-12 DIAGNOSIS — R0602 Shortness of breath: Secondary | ICD-10-CM | POA: Diagnosis not present

## 2015-04-26 DIAGNOSIS — J42 Unspecified chronic bronchitis: Secondary | ICD-10-CM | POA: Diagnosis not present

## 2015-04-26 DIAGNOSIS — F17210 Nicotine dependence, cigarettes, uncomplicated: Secondary | ICD-10-CM | POA: Diagnosis not present

## 2015-04-26 DIAGNOSIS — I10 Essential (primary) hypertension: Secondary | ICD-10-CM | POA: Diagnosis not present

## 2015-04-26 DIAGNOSIS — E119 Type 2 diabetes mellitus without complications: Secondary | ICD-10-CM | POA: Diagnosis not present

## 2015-04-26 DIAGNOSIS — E782 Mixed hyperlipidemia: Secondary | ICD-10-CM | POA: Diagnosis not present

## 2015-04-26 DIAGNOSIS — E559 Vitamin D deficiency, unspecified: Secondary | ICD-10-CM | POA: Diagnosis not present

## 2015-05-06 DIAGNOSIS — Z8249 Family history of ischemic heart disease and other diseases of the circulatory system: Secondary | ICD-10-CM | POA: Diagnosis not present

## 2015-05-06 DIAGNOSIS — R011 Cardiac murmur, unspecified: Secondary | ICD-10-CM | POA: Diagnosis not present

## 2015-05-06 DIAGNOSIS — E785 Hyperlipidemia, unspecified: Secondary | ICD-10-CM | POA: Diagnosis not present

## 2015-05-06 DIAGNOSIS — E119 Type 2 diabetes mellitus without complications: Secondary | ICD-10-CM | POA: Diagnosis not present

## 2015-05-06 DIAGNOSIS — I10 Essential (primary) hypertension: Secondary | ICD-10-CM | POA: Diagnosis not present

## 2015-05-06 DIAGNOSIS — R0600 Dyspnea, unspecified: Secondary | ICD-10-CM | POA: Diagnosis not present

## 2015-05-17 DIAGNOSIS — R0609 Other forms of dyspnea: Secondary | ICD-10-CM | POA: Diagnosis not present

## 2015-05-17 DIAGNOSIS — I119 Hypertensive heart disease without heart failure: Secondary | ICD-10-CM | POA: Diagnosis not present

## 2015-05-17 DIAGNOSIS — R011 Cardiac murmur, unspecified: Secondary | ICD-10-CM | POA: Diagnosis not present

## 2015-05-17 DIAGNOSIS — E785 Hyperlipidemia, unspecified: Secondary | ICD-10-CM | POA: Diagnosis not present

## 2015-05-17 DIAGNOSIS — Z8249 Family history of ischemic heart disease and other diseases of the circulatory system: Secondary | ICD-10-CM | POA: Diagnosis not present

## 2015-05-17 DIAGNOSIS — R0600 Dyspnea, unspecified: Secondary | ICD-10-CM | POA: Diagnosis not present

## 2015-05-17 DIAGNOSIS — E119 Type 2 diabetes mellitus without complications: Secondary | ICD-10-CM | POA: Diagnosis not present

## 2015-07-21 DIAGNOSIS — H8149 Vertigo of central origin, unspecified ear: Secondary | ICD-10-CM | POA: Diagnosis not present

## 2015-07-26 DIAGNOSIS — E119 Type 2 diabetes mellitus without complications: Secondary | ICD-10-CM | POA: Diagnosis not present

## 2015-07-26 DIAGNOSIS — E782 Mixed hyperlipidemia: Secondary | ICD-10-CM | POA: Diagnosis not present

## 2015-07-26 DIAGNOSIS — E559 Vitamin D deficiency, unspecified: Secondary | ICD-10-CM | POA: Diagnosis not present

## 2015-08-02 DIAGNOSIS — I10 Essential (primary) hypertension: Secondary | ICD-10-CM | POA: Diagnosis not present

## 2015-08-02 DIAGNOSIS — J42 Unspecified chronic bronchitis: Secondary | ICD-10-CM | POA: Diagnosis not present

## 2015-08-02 DIAGNOSIS — F17210 Nicotine dependence, cigarettes, uncomplicated: Secondary | ICD-10-CM | POA: Diagnosis not present

## 2015-08-02 DIAGNOSIS — E782 Mixed hyperlipidemia: Secondary | ICD-10-CM | POA: Diagnosis not present

## 2015-08-02 DIAGNOSIS — E559 Vitamin D deficiency, unspecified: Secondary | ICD-10-CM | POA: Diagnosis not present

## 2015-08-02 DIAGNOSIS — E119 Type 2 diabetes mellitus without complications: Secondary | ICD-10-CM | POA: Diagnosis not present

## 2015-08-02 DIAGNOSIS — H8110 Benign paroxysmal vertigo, unspecified ear: Secondary | ICD-10-CM | POA: Diagnosis not present

## 2015-11-02 DIAGNOSIS — E782 Mixed hyperlipidemia: Secondary | ICD-10-CM | POA: Diagnosis not present

## 2015-11-02 DIAGNOSIS — E119 Type 2 diabetes mellitus without complications: Secondary | ICD-10-CM | POA: Diagnosis not present

## 2015-11-08 DIAGNOSIS — E119 Type 2 diabetes mellitus without complications: Secondary | ICD-10-CM | POA: Diagnosis not present

## 2015-11-08 DIAGNOSIS — I10 Essential (primary) hypertension: Secondary | ICD-10-CM | POA: Diagnosis not present

## 2015-11-08 DIAGNOSIS — F17210 Nicotine dependence, cigarettes, uncomplicated: Secondary | ICD-10-CM | POA: Diagnosis not present

## 2015-11-08 DIAGNOSIS — E782 Mixed hyperlipidemia: Secondary | ICD-10-CM | POA: Diagnosis not present

## 2015-11-08 DIAGNOSIS — Z23 Encounter for immunization: Secondary | ICD-10-CM | POA: Diagnosis not present

## 2015-11-08 DIAGNOSIS — E559 Vitamin D deficiency, unspecified: Secondary | ICD-10-CM | POA: Diagnosis not present

## 2015-11-08 DIAGNOSIS — H8110 Benign paroxysmal vertigo, unspecified ear: Secondary | ICD-10-CM | POA: Diagnosis not present

## 2015-11-08 DIAGNOSIS — J42 Unspecified chronic bronchitis: Secondary | ICD-10-CM | POA: Diagnosis not present

## 2016-01-25 DIAGNOSIS — D225 Melanocytic nevi of trunk: Secondary | ICD-10-CM | POA: Diagnosis not present

## 2016-01-25 DIAGNOSIS — L820 Inflamed seborrheic keratosis: Secondary | ICD-10-CM | POA: Diagnosis not present

## 2016-02-28 DIAGNOSIS — I10 Essential (primary) hypertension: Secondary | ICD-10-CM | POA: Insufficient documentation

## 2016-02-28 DIAGNOSIS — E782 Mixed hyperlipidemia: Secondary | ICD-10-CM | POA: Insufficient documentation

## 2016-02-28 DIAGNOSIS — E1165 Type 2 diabetes mellitus with hyperglycemia: Secondary | ICD-10-CM | POA: Insufficient documentation

## 2017-05-19 ENCOUNTER — Encounter (HOSPITAL_COMMUNITY): Payer: Self-pay

## 2017-05-19 ENCOUNTER — Ambulatory Visit (HOSPITAL_COMMUNITY)
Admission: EM | Admit: 2017-05-19 | Discharge: 2017-05-19 | Disposition: A | Discharge status: Home / Self Care | Payer: Medicare HMO | Attending: Family Medicine | Admitting: Family Medicine

## 2017-05-19 ENCOUNTER — Other Ambulatory Visit: Payer: Self-pay

## 2017-05-19 DIAGNOSIS — B379 Candidiasis, unspecified: Secondary | ICD-10-CM | POA: Diagnosis not present

## 2017-05-19 MED ORDER — HYDROCORTISONE 2.5 % EX LOTN: TOPICAL_LOTION | Freq: Two times a day (BID) | CUTANEOUS | 0 refills | Status: DC

## 2017-05-19 MED ORDER — PREDNISONE 50 MG PO TABS: ORAL_TABLET | ORAL | 0 refills | Status: DC

## 2017-05-19 MED ORDER — CLOTRIMAZOLE 1 % EX CREA: TOPICAL_CREAM | CUTANEOUS | 0 refills | Status: DC

## 2017-05-19 NOTE — ED Provider Notes (Signed)
[place]    CSN: [id number] Arrival date & time: 05/19/17  1447     History   Chief Complaint Chief Complaint  Patient presents with  . Rash    HPI Breanna Shields is a 77 y.o. female.   77 yo female here for rash on her chin and neck folds and eyelid folds. She has new rash as well on her left forearm in the crease. She was taking trulicity and stopped. She had been using lancome cream and stopped it as well. Rash has persisted despite these efforts. The rash is very itchy.      Past Medical History:  Diagnosis Date  . Allergy   . Diabetes mellitus    type ii  . Hyperlipidemia   . Hypertension     Patient Active Problem List   Diagnosis Date Noted  . Dyspnea 04/17/2011    Past Surgical History:  Procedure Laterality Date  . CATARACT EXTRACTION W/ INTRAOCULAR LENS  IMPLANT, BILATERAL    . endometrial polyp  07/1998  . REPLACEMENT TOTAL KNEE BILATERAL  2007 & 2006    OB History   None      Home Medications    Prior to Admission medications   Medication Sig Start Date End Date Taking? Authorizing Provider  aspirin EC 81 MG tablet Take 325 mg by mouth daily.    Yes [provider]  atorvastatin (LIPITOR) 80 MG tablet Take 80 mg by mouth Daily. Take 1/2 tablet daily 12/12/10  Yes [provider]  Calcium Carb-Cholecalciferol (OS-CAL 500 + D) 500-600 MG-UNIT TABS Take 1,200 mg by mouth daily.     Yes [provider]  Chlorpheniramine-Phenylephrine (ACTIFED COLD/ALLERGY PO) Take 1 tablet by mouth daily as needed.     Yes [provider]  Cholecalciferol (VITAMIN D-3) 5000 UNITS TABS Take by mouth.   Yes [provider]  Cyanocobalamin (VITAMIN B 12 PO) Take 1 tablet by mouth daily.     Yes [provider]  DIOVAN 320 MG tablet Take 1 tablet by mouth Daily. 12/16/10  Yes [provider]  diphenhydramine-acetaminophen (TYLENOL PM) 25-500 MG TABS Take 1 tablet by mouth at bedtime as needed.      Yes [provider]  fish oil-omega-3 fatty acids 1000 MG capsule Take 2 g by mouth daily.   Yes [provider]  ibuprofen (ADVIL,MOTRIN) 800 MG tablet Take 800 mg by mouth every 8 (eight) hours as needed.   Yes [provider]  Multiple Vitamins-Minerals (MULTIVITAMIN WITH MINERALS) tablet Take 1 tablet by mouth daily.     Yes [provider]  ONE TOUCH ULTRA TEST test strip  12/10/10  Yes [provider]  pioglitazone (ACTOS) 30 MG tablet Take 1 tablet by mouth Daily. 12/22/10  Yes [provider]  thiamine (VITAMIN B-1) 50 MG tablet Take 50 mg by mouth daily.   Yes [provider]  vitamin E (VITAMIN E) 400 UNIT capsule Take 400 Units by mouth daily.     Yes [provider]    Family History Family History  Problem Relation Age of Onset  . Heart failure Father   . Heart failure Mother   . Cancer Brother   . Heart failure Brother        x6    Social History Social History   Tobacco Use  . Smoking status: Former Smoker    Packs/day: 1.00    Years: 25.00    Pack years: 25.00  Types: Cigarettes    Last attempt to quit: 04/21/2011    Years since quitting: 6.0  . Smokeless tobacco: Never Used  Substance Use Topics  . Alcohol use: Yes    Comment: rarely  . Drug use: No     Allergies   Chantix [varenicline tartrate] and Oxytrol [oxybutynin base]   Review of Systems Review of Systems  Constitutional: Negative for activity change and appetite change.  HENT: Negative for congestion and dental problem.   Eyes: Negative for discharge.  Respiratory: Negative for apnea and chest tightness.   Cardiovascular: Negative for chest pain and leg swelling.  Genitourinary: Negative for difficulty urinating and dysuria.  Musculoskeletal: Negative for arthralgias and back pain.  Skin: Positive for rash.  Neurological: Negative for dizziness and headaches.  Hematological: Negative for adenopathy. Does not  bruise/bleed easily.     Physical Exam Triage Vital Signs ED Triage Vitals  Enc Vitals Group     BP 05/19/17 1510 (!) 146/63     Pulse Rate 05/19/17 1510 76     Resp 05/19/17 1510 18     Temp 05/19/17 1510 97.7 F (36.5 C)     Temp src --      SpO2 05/19/17 1510 98 %     Weight 05/19/17 1511 230 lb (104.3 kg)     Height --      Head Circumference --      Peak Flow --      Pain Score 05/19/17 1510 0     Pain Loc --      Pain Edu? --      Excl. in [place]? --    No data found.  Updated Vital Signs BP (!) 146/63   Pulse 76   Temp 97.7 F (36.5 C)   Resp 18   Wt 230 lb (104.3 kg)   SpO2 98%   BMI 40.74 kg/m   Visual Acuity Right Eye Distance:   Left Eye Distance:   Bilateral Distance:    Right Eye Near:   Left Eye Near:    Bilateral Near:     Physical Exam  Constitutional: She is oriented to person, place, and time. She appears well-developed and well-nourished.  HENT:  Head: Normocephalic and atraumatic.  Pulmonary/Chest: Effort normal. No respiratory distress.  Neurological: She is alert and oriented to person, place, and time.  Skin:  She has a erythematous patch of rash in the skin fold of her neck and skin fold of her chin and folds of her eyelids. Her eyes are spared. She has erythematous papules with excoriation on left flexor aspect of arm     UC Treatments / Results  Labs (all labs ordered are listed, but only abnormal results are displayed) Labs Reviewed - No data to display  EKG None Radiology No results found.  Procedures Procedures (including critical care time)  Medications Ordered in UC Medications - No data to display   Initial Impression / Assessment and Plan / UC Course  I have reviewed the triage vital signs and the nursing notes.  Pertinent labs & imaging results that were available during my care of the patient were reviewed by me and considered in my medical decision making (see chart for details).     1. Yeast infection:  treat with topical clotrimizole and topical steroid cream for itching. Will also give oral prednisone since rash is very irritated and bothersome.  Final Clinical Impressions(s) / UC Diagnoses   Final diagnoses:  None    ED  Discharge Orders    None       Controlled Substance Prescriptions [place] Controlled Substance Registry consulted? Not Applicable   Dannielle Huh, DO 05/19/17 1539

## 2017-05-19 NOTE — ED Triage Notes (Signed)
Pt presents with complaint of a rash to her eyes and neck x 1 month.

## 2018-02-11 DIAGNOSIS — R05 Cough: Secondary | ICD-10-CM | POA: Diagnosis not present

## 2018-02-11 DIAGNOSIS — R21 Rash and other nonspecific skin eruption: Secondary | ICD-10-CM | POA: Diagnosis not present

## 2018-02-11 DIAGNOSIS — E119 Type 2 diabetes mellitus without complications: Secondary | ICD-10-CM | POA: Diagnosis not present

## 2018-02-11 DIAGNOSIS — J069 Acute upper respiratory infection, unspecified: Secondary | ICD-10-CM | POA: Diagnosis not present

## 2018-03-25 ENCOUNTER — Other Ambulatory Visit: Payer: Self-pay

## 2018-03-25 DIAGNOSIS — E1165 Type 2 diabetes mellitus with hyperglycemia: Secondary | ICD-10-CM | POA: Diagnosis not present

## 2018-03-25 DIAGNOSIS — E559 Vitamin D deficiency, unspecified: Secondary | ICD-10-CM | POA: Diagnosis not present

## 2018-03-25 DIAGNOSIS — E782 Mixed hyperlipidemia: Secondary | ICD-10-CM | POA: Diagnosis not present

## 2018-03-25 NOTE — Patient Outreach (Signed)
  [facility]) Care Management Chronic Special Needs Program   03/25/2018  Name: Breanna Shields, DOB: 1941/01/18  MRN: [id number]  The client was discussed in 03/22/18's interdisciplinary care team meeting. The client's individualized care plan was developed based on completed Health Risk Assessment  The following issues were discussed:  Key risk triggers/risk stratification and Care Plan  Participants present:  Mahlon Gammon, MSN, RN, CCM, CNS    Thea Silversmith, MSN, RN, CCM   Alsace Dowd RN,BSN,CCM, CDE       Recommendations:  Send education on HTN and safety  Plan:  Plan to send copy of individualized care plan to client Plan to send individualized care plan to provider. Chronic Care Management Coordinator will outreach in 2-4 months  Follow-up:  2-4 months Peter Garter RN, Jackquline Denmark, CDE Chronic Care Management Coordinator [place] [facility] [phone]

## 2018-04-01 DIAGNOSIS — E782 Mixed hyperlipidemia: Secondary | ICD-10-CM | POA: Diagnosis not present

## 2018-04-01 DIAGNOSIS — E559 Vitamin D deficiency, unspecified: Secondary | ICD-10-CM | POA: Diagnosis not present

## 2018-04-01 DIAGNOSIS — I10 Essential (primary) hypertension: Secondary | ICD-10-CM | POA: Diagnosis not present

## 2018-04-01 DIAGNOSIS — E1165 Type 2 diabetes mellitus with hyperglycemia: Secondary | ICD-10-CM | POA: Diagnosis not present

## 2018-06-21 DIAGNOSIS — E1165 Type 2 diabetes mellitus with hyperglycemia: Secondary | ICD-10-CM | POA: Diagnosis not present

## 2018-06-21 DIAGNOSIS — E782 Mixed hyperlipidemia: Secondary | ICD-10-CM | POA: Diagnosis not present

## 2018-06-24 DIAGNOSIS — E782 Mixed hyperlipidemia: Secondary | ICD-10-CM | POA: Diagnosis not present

## 2018-06-24 DIAGNOSIS — E1165 Type 2 diabetes mellitus with hyperglycemia: Secondary | ICD-10-CM | POA: Diagnosis not present

## 2018-06-24 DIAGNOSIS — I10 Essential (primary) hypertension: Secondary | ICD-10-CM | POA: Diagnosis not present

## 2018-06-24 DIAGNOSIS — E559 Vitamin D deficiency, unspecified: Secondary | ICD-10-CM | POA: Diagnosis not present

## 2018-07-03 DIAGNOSIS — Z13220 Encounter for screening for lipoid disorders: Secondary | ICD-10-CM | POA: Diagnosis not present

## 2018-07-03 DIAGNOSIS — R0602 Shortness of breath: Secondary | ICD-10-CM | POA: Diagnosis not present

## 2018-07-03 DIAGNOSIS — Z1159 Encounter for screening for other viral diseases: Secondary | ICD-10-CM | POA: Diagnosis not present

## 2018-07-03 DIAGNOSIS — R21 Rash and other nonspecific skin eruption: Secondary | ICD-10-CM | POA: Diagnosis not present

## 2018-07-03 DIAGNOSIS — Z0000 Encounter for general adult medical examination without abnormal findings: Secondary | ICD-10-CM | POA: Diagnosis not present

## 2018-07-03 DIAGNOSIS — R011 Cardiac murmur, unspecified: Secondary | ICD-10-CM | POA: Diagnosis not present

## 2018-07-03 DIAGNOSIS — R0600 Dyspnea, unspecified: Secondary | ICD-10-CM | POA: Diagnosis not present

## 2018-07-03 DIAGNOSIS — E1165 Type 2 diabetes mellitus with hyperglycemia: Secondary | ICD-10-CM | POA: Diagnosis not present

## 2018-07-04 DIAGNOSIS — R011 Cardiac murmur, unspecified: Secondary | ICD-10-CM | POA: Diagnosis not present

## 2018-07-04 DIAGNOSIS — I517 Cardiomegaly: Secondary | ICD-10-CM | POA: Diagnosis not present

## 2018-07-16 ENCOUNTER — Other Ambulatory Visit: Payer: Self-pay

## 2018-07-16 NOTE — Patient Outreach (Signed)
  [facility]) Care Management Chronic Special Needs Program  07/16/2018  Name: Breanna Shields DOB: 1940/06/27  MRN: [id number]  Breanna Shields is enrolled in a chronic special needs plan for Diabetes. Client called with no answer No answer and HIPAA compliant message left. Plan for 2nd outreach call in one week Chronic care management coordinator will attempt outreach in one week.   Peter Garter RN, Jackquline Denmark, CDE Chronic Care Management Coordinator [facility] [phone]

## 2018-07-19 ENCOUNTER — Other Ambulatory Visit: Payer: Self-pay

## 2018-07-19 NOTE — Patient Outreach (Signed)
  [facility]) Care Management Chronic Special Needs Program  07/19/2018  Name: Breanna Shields DOB: 25-Jul-1940  MRN: [id number]  [name] is enrolled in a chronic special needs plan for Diabetes. Client called to returned call yesterday and left message No answer and HIPAA compliant message left. Plan for 3rd outreach call in one week Chronic care management coordinator will attempt outreach in one week.   Peter Garter RN, Jackquline Denmark, CDE Chronic Care Management Coordinator [facility] [facility] [phone]

## 2018-07-24 ENCOUNTER — Other Ambulatory Visit: Payer: Self-pay

## 2018-07-24 NOTE — Patient Outreach (Signed)
  [facility]) Care Management Chronic Special Needs Program  07/24/2018  Name: Breanna Shields DOB: 09-17-1940  MRN: [id number]  Breanna Shields is enrolled in a chronic special needs plan for Diabetes.   HIPAA compliant message left with family member. Plan for 3rd outreach call in one week if call not returned Chronic care management coordinator will attempt outreach in one week.   Peter Garter RN, Jackquline Denmark, CDE Chronic Care Management Coordinator [place] [facility] [phone]

## 2018-07-30 ENCOUNTER — Other Ambulatory Visit: Payer: Self-pay

## 2018-07-30 NOTE — Patient Outreach (Signed)
  [facility]) Care Management Chronic Special Needs Program  07/30/2018  Name: Breanna Shields DOB: April 15, 1940  MRN: [id number]  [name] is enrolled in a chronic special needs plan for Diabetes. Chronic Care Management Coordinator telephoned client to review health risk assessment and to develop individualized care plan.  Introduced the chronic care management program, importance of client participation, and taking their care plan to all provider appointments and inpatient facilities.  Reviewed the transition of care process and possible referral to community care management.  Subjective: Client states that her diabetes is doing good and Dr.Altheimer was pleased with her last Hemoglobin A1C of 6.5%.  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  States she is forgetful at times but it not a problem.  States her B/P is good.  States her blood sugars range 110-130 most days.  Goals Addressed            This Visit's Progress   . Client understands the importance of follow-up with providers by attending scheduled visits   On track   . Client will use Assistive Devices as needed and verbalize understanding of device use   On track   . Client will verbalize knowledge of self management of Hypertension as evidences by BP reading of 140/90 or less; or as defined by provider   On track   . HEMOGLOBIN A1C < 7.0       Diabetes self management actions:  Glucose monitoring per provider recommendations  Eat Healthy  Check feet daily  Visit provider every 3-6 months as directed  Hbg A1C level every 3-6 months.  Eye Exam yearly    . Maintain timely refills of diabetic medication as prescribed within the year .   On track   . COMPLETED: Obtain annual  Lipid Profile, LDL-C       Completed 06/21/18    . Obtain Annual Eye (retinal)  Exam    On track   . Obtain Annual Foot Exam   On track   . COMPLETED: Obtain annual screen for  micro albuminuria (urine) , nephropathy (kidney problems)       Completed 03/25/18    . Obtain Hemoglobin A1C at least 2 times per year   On track   . Visit Primary Care Provider or Endocrinologist at least 2 times per year    On track    Client is meeting diabetes self management goal of hemoglobin A1C of <7% with last reading of 6.5% Encouraged to increase her activity as tolerated Reviewed low CHO diet and portion sizes Reviewed number for 24 hour nurse Line Discussed COVID19 cause, symptoms, precautions (social distancing, stay at home order, hand washing), confirmed client knows how to contact provider.  Plan:  Send successful outreach letter with a copy of their individualized care plan, Send individual care plan to provider and Send educational Financial planner flyer  Chronic care management coordination will outreach in:  4-5 Months     Peter Garter RN, [employer], [place] Management Coordinator [place] [facility] [phone]

## 2018-09-23 DIAGNOSIS — E1165 Type 2 diabetes mellitus with hyperglycemia: Secondary | ICD-10-CM | POA: Diagnosis not present

## 2018-09-23 DIAGNOSIS — E782 Mixed hyperlipidemia: Secondary | ICD-10-CM | POA: Diagnosis not present

## 2018-09-30 DIAGNOSIS — Z96652 Presence of left artificial knee joint: Secondary | ICD-10-CM | POA: Diagnosis not present

## 2018-09-30 DIAGNOSIS — E1165 Type 2 diabetes mellitus with hyperglycemia: Secondary | ICD-10-CM | POA: Diagnosis not present

## 2018-09-30 DIAGNOSIS — E782 Mixed hyperlipidemia: Secondary | ICD-10-CM | POA: Diagnosis not present

## 2018-09-30 DIAGNOSIS — I10 Essential (primary) hypertension: Secondary | ICD-10-CM | POA: Diagnosis not present

## 2018-09-30 DIAGNOSIS — Z96653 Presence of artificial knee joint, bilateral: Secondary | ICD-10-CM | POA: Diagnosis not present

## 2018-09-30 DIAGNOSIS — Z96651 Presence of right artificial knee joint: Secondary | ICD-10-CM | POA: Diagnosis not present

## 2018-09-30 DIAGNOSIS — Z471 Aftercare following joint replacement surgery: Secondary | ICD-10-CM | POA: Diagnosis not present

## 2018-09-30 DIAGNOSIS — E559 Vitamin D deficiency, unspecified: Secondary | ICD-10-CM | POA: Diagnosis not present

## 2018-09-30 DIAGNOSIS — M01X0 Direct infection of unspecified joint in infectious and parasitic diseases classified elsewhere: Secondary | ICD-10-CM | POA: Diagnosis not present

## 2018-10-17 DIAGNOSIS — Z96651 Presence of right artificial knee joint: Secondary | ICD-10-CM | POA: Diagnosis not present

## 2018-10-17 DIAGNOSIS — M25561 Pain in right knee: Secondary | ICD-10-CM | POA: Diagnosis not present

## 2018-10-17 DIAGNOSIS — T8484XA Pain due to internal orthopedic prosthetic devices, implants and grafts, initial encounter: Secondary | ICD-10-CM | POA: Diagnosis not present

## 2018-10-18 ENCOUNTER — Other Ambulatory Visit (HOSPITAL_COMMUNITY): Payer: Self-pay | Admitting: Orthopedic Surgery

## 2018-10-18 ENCOUNTER — Other Ambulatory Visit: Payer: Self-pay | Admitting: Orthopedic Surgery

## 2018-10-18 DIAGNOSIS — Z96651 Presence of right artificial knee joint: Secondary | ICD-10-CM

## 2018-10-29 ENCOUNTER — Other Ambulatory Visit: Payer: Self-pay

## 2018-10-29 ENCOUNTER — Encounter (HOSPITAL_COMMUNITY)
Admission: RE | Admit: 2018-10-29 | Discharge: 2018-10-29 | Disposition: A | Discharge status: Home / Self Care | Payer: HMO | Source: Ambulatory Visit | Attending: Orthopedic Surgery | Admitting: Orthopedic Surgery

## 2018-10-29 DIAGNOSIS — R948 Abnormal results of function studies of other organs and systems: Secondary | ICD-10-CM | POA: Diagnosis not present

## 2018-10-29 DIAGNOSIS — Z96651 Presence of right artificial knee joint: Secondary | ICD-10-CM

## 2018-10-29 MED ORDER — TECHNETIUM TC 99M MEDRONATE IV KIT
22.0000 | PACK | Freq: Once | INTRAVENOUS | Status: AC | PRN
  Administered 2018-10-29: 22 via INTRAVENOUS

## 2018-11-07 DIAGNOSIS — M25561 Pain in right knee: Secondary | ICD-10-CM | POA: Diagnosis not present

## 2018-11-07 DIAGNOSIS — M25562 Pain in left knee: Secondary | ICD-10-CM | POA: Diagnosis not present

## 2018-11-13 DIAGNOSIS — R011 Cardiac murmur, unspecified: Secondary | ICD-10-CM | POA: Diagnosis not present

## 2018-11-13 DIAGNOSIS — I517 Cardiomegaly: Secondary | ICD-10-CM | POA: Diagnosis not present

## 2018-11-18 DIAGNOSIS — M545 Low back pain: Secondary | ICD-10-CM | POA: Diagnosis not present

## 2018-11-18 DIAGNOSIS — Z96652 Presence of left artificial knee joint: Secondary | ICD-10-CM | POA: Diagnosis not present

## 2018-12-03 ENCOUNTER — Other Ambulatory Visit: Payer: Self-pay

## 2018-12-03 NOTE — Patient Outreach (Signed)
  [facility]) Care Management Chronic Special Needs Program  12/03/2018  Name: Breanna Shields DOB: [birth year]  MRN: [id number]  [name] is enrolled in a chronic special needs plan for Diabetes. Client called with no answer No answer and HIPAA compliant message left. 1st attempt Plan for 2nd outreach call in one week Chronic care management coordinator will attempt outreach in one week.   Peter Garter RN, Jackquline Denmark, CDE Chronic Care Management Coordinator [facility] [phone]

## 2018-12-09 DIAGNOSIS — M25562 Pain in left knee: Secondary | ICD-10-CM | POA: Diagnosis not present

## 2018-12-09 DIAGNOSIS — M545 Low back pain: Secondary | ICD-10-CM | POA: Diagnosis not present

## 2018-12-10 ENCOUNTER — Other Ambulatory Visit: Payer: Self-pay

## 2018-12-10 NOTE — Patient Outreach (Signed)
  [facility]) Care Management Chronic Special Needs Program  12/10/2018  Name: Breanna Shields DOB: Jul 17, 1940  MRN: [id number]  [name] is enrolled in a chronic special needs plan for Diabetes. Reviewed and updated care plan.  Subjective: Client states that she has been keeping her diabetes in good control.  States that her blood sugars range from 100-150 when she checks.  States her eye exam was cancelled during the pandemic and she has not called yet to reschedule.  States she has been having knee issues and she is to have a rt knee replacement at the end of the year.  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  Goals Addressed            This Visit's Progress   . Client understands the importance of follow-up with providers by attending scheduled visits   On track   . Client will use Assistive Devices as needed and verbalize understanding of device use   On track   . Client will verbalize knowledge of self management of Hypertension as evidences by BP reading of 140/90 or less; or as defined by provider   On track   . HEMOGLOBIN A1C < 7.0        Diabetes self management actions:  Glucose monitoring per provider recommendations  Eat Healthy  Check feet daily  Visit provider every 3-6 months as directed  Hbg A1C level every 3-6 months.  Eye Exam yearly    . Maintain timely refills of diabetic medication as prescribed within the year .   On track   . Obtain Annual Eye (retinal)  Exam    On track    Please call to schedule eye exam    . Obtain Annual Foot Exam   On track   . COMPLETED: Obtain Hemoglobin A1C at least 2 times per year       Completed 06/21/18, 09/23/18    . COMPLETED: Visit Primary Care Provider or Endocrinologist at least 2 times per year        Completed 06/24/18, 09/30/18     Client is meeting diabetes self management goal of hemoglobin A1C of <7% with last reading of 6.5%  Instructed to call and schedule her annual eye exam  Reinforced to follow a low CHO low sodium diet and to watch her portion sizes Encouraged to increase her activity as tolerated Reviewed number for 24-hour nurse Line Reviewed COVID 19 precautions  Plan:  Send successful outreach letter with a copy of their individualized care plan and Send individual care plan to provider  Chronic care management coordinator will outreach in:  4-6 Months     Peter Garter RN, [facility], [place] Management Coordinator [place] [facility] [phone]

## 2018-12-18 DIAGNOSIS — I517 Cardiomegaly: Secondary | ICD-10-CM | POA: Diagnosis not present

## 2018-12-25 DIAGNOSIS — E782 Mixed hyperlipidemia: Secondary | ICD-10-CM | POA: Diagnosis not present

## 2018-12-25 DIAGNOSIS — E1165 Type 2 diabetes mellitus with hyperglycemia: Secondary | ICD-10-CM | POA: Diagnosis not present

## 2018-12-31 DIAGNOSIS — E1165 Type 2 diabetes mellitus with hyperglycemia: Secondary | ICD-10-CM | POA: Diagnosis not present

## 2018-12-31 DIAGNOSIS — I10 Essential (primary) hypertension: Secondary | ICD-10-CM | POA: Diagnosis not present

## 2018-12-31 DIAGNOSIS — E559 Vitamin D deficiency, unspecified: Secondary | ICD-10-CM | POA: Diagnosis not present

## 2018-12-31 DIAGNOSIS — E782 Mixed hyperlipidemia: Secondary | ICD-10-CM | POA: Diagnosis not present

## 2019-01-23 DIAGNOSIS — M79661 Pain in right lower leg: Secondary | ICD-10-CM | POA: Diagnosis not present

## 2019-01-23 DIAGNOSIS — R011 Cardiac murmur, unspecified: Secondary | ICD-10-CM | POA: Diagnosis not present

## 2019-01-23 DIAGNOSIS — R0989 Other specified symptoms and signs involving the circulatory and respiratory systems: Secondary | ICD-10-CM | POA: Diagnosis not present

## 2019-01-23 DIAGNOSIS — I517 Cardiomegaly: Secondary | ICD-10-CM | POA: Diagnosis not present

## 2019-01-23 DIAGNOSIS — E1165 Type 2 diabetes mellitus with hyperglycemia: Secondary | ICD-10-CM | POA: Diagnosis not present

## 2019-01-23 NOTE — H&P (Signed)
TOTAL KNEE REVISION ADMISSION H&P  Patient is being admitted for right revision total knee arthroplasty.  Subjective:  Chief Complaint:   Right knee pain s/p TKA.  HPI: Breanna Shields, 78 y.o. female, has a history of pain and functional disability in the right knee(s) due to failed previous arthroplasty and patient has failed non-surgical conservative treatments for greater than 12 weeks to include NSAID's and/or analgesics, supervised PT with diminished ADL's post treatment, use of assistive devices and activity modification. The indications for the revision of the total knee arthroplasty are loosening of one or more components. Onset of symptoms was gradual starting >10 years ago with gradually worsening course since that time.  Prior procedures on the right knee(s) include arthroplasty.  Patient currently rates pain in the right knee(s) at 7 out of 10 with activity. There is night pain, worsening of pain with activity and weight bearing, pain that interferes with activities of daily living and pain with passive range of motion.  Patient has evidence of prosthetic loosening by imaging studies. This condition presents safety issues increasing the risk of falls.  There is no current active infection.   Risks, benefits and expectations were discussed with the patient.  Risks including but not limited to the risk of anesthesia, blood clots, nerve damage, blood vessel damage, failure of the prosthesis, infection and up to and including death.  Patient understand the risks, benefits and expectations and wishes to proceed with surgery.   PCP: Lorne Skeens, MD  D/C Plans:       Home   Post-op Meds:       No Rx given / Rx given for ASA, Robaxin,  , Iron, Colace and MiraLax  Tranexamic Acid:      To be given - IV   Decadron:      Is to be given  FYI:      ASA  Norco  DME:   Rx sent for - RW & 3-n-1  PT:   OPPT   Pharmacy: [facility]    Patient Active Problem List   Diagnosis Date  Noted  . Mixed dyslipidemia 02/28/2016  . Hypertension, essential 02/28/2016  . Type 2 diabetes mellitus with hyperglycemia ([place]) 02/28/2016  . Dyspnea 04/17/2011   Past Medical History:  Diagnosis Date  . Allergy   . Diabetes mellitus    type ii  . Hyperlipidemia   . Hypertension     Past Surgical History:  Procedure Laterality Date  . CATARACT EXTRACTION W/ INTRAOCULAR LENS  IMPLANT, BILATERAL    . endometrial polyp  07/1998  . REPLACEMENT TOTAL KNEE BILATERAL  2007 & 2006    No current facility-administered medications for this encounter.   Current Outpatient Medications  Medication Sig Dispense Refill Last Dose  . aspirin EC 81 MG tablet Take 81 mg by mouth daily.      Marland Kitchen atorvastatin (LIPITOR) 80 MG tablet Take 40 mg by mouth every evening.      . B Complex-C (B-COMPLEX WITH VITAMIN C) tablet Take 1 tablet by mouth daily.     . Cholecalciferol (VITAMIN D3) 50 MCG (2000 UT) TABS Take 2,000 Units by mouth daily.     . Doxylamine Succinate, Sleep, (SLEEP AID PO) Take 1 tablet by mouth at bedtime.     Marland Kitchen ibuprofen (ADVIL) 200 MG tablet Take 200 mg by mouth daily.     . Multiple Vitamins-Minerals (MULTIVITAMIN WITH MINERALS) tablet Take 1 tablet by mouth daily.       Marland Kitchen  olmesartan (BENICAR) 40 MG tablet Take 40 mg by mouth daily.     . pioglitazone (ACTOS) 30 MG tablet Take 30 mg by mouth Daily.      . TRULICITY [id number] 0000000 SOPN Inject 0.75 mg into the skin every Tuesday at 6 PM.     . ONE TOUCH ULTRA TEST test strip       Allergies  Allergen Reactions  . Chantix [Varenicline Tartrate] Nausea Only and Other (See Comments)    Dizziness  . Oxytrol [Oxybutynin Base] Other (See Comments)    Dry mouth     Social History   Tobacco Use  . Smoking status: Former Smoker    Packs/day: 1.00    Years: 25.00    Pack years: 25.00    Types: Cigarettes    Quit date: 04/21/2011    Years since quitting: 7.7  . Smokeless tobacco: Never Used  Substance Use Topics  . Alcohol use: Yes     Comment: rarely    Family History  Problem Relation Age of Onset  . Heart failure Father   . Heart failure Mother   . Cancer Brother   . Heart failure Brother        x6     Review of Systems  Constitutional: Negative.   HENT: Negative.   Eyes: Negative.   Respiratory: Negative.   Cardiovascular: Negative.   Gastrointestinal: Negative.   Genitourinary: Negative.   Musculoskeletal: Positive for joint pain.  Skin: Negative.   Neurological: Negative.   Endo/Heme/Allergies: Positive for environmental allergies.  Psychiatric/Behavioral: Negative.       Objective:  Physical Exam  Constitutional: She is oriented to person, place, and time. She appears well-developed.  HENT:  Head: Normocephalic.  Eyes: Pupils are equal, round, and reactive to light.  Neck: No JVD present. No tracheal deviation present. No thyromegaly present.  Cardiovascular: Normal rate, regular rhythm and intact distal pulses.  Respiratory: Effort normal and breath sounds normal. No respiratory distress. She has no wheezes.  GI: Soft. There is no abdominal tenderness. There is no guarding.  Musculoskeletal:     Cervical back: Neck supple.     Right knee: Swelling present. No deformity, erythema, ecchymosis or lacerations. Decreased range of motion. Tenderness present.  Lymphadenopathy:    She has no cervical adenopathy.  Neurological: She is alert and oriented to person, place, and time.  Skin: Skin is warm and dry.  Psychiatric: She has a normal mood and affect.      Labs:  Estimated body mass index is 40.74 kg/m as calculated from the following:   Height as of 05/22/11: '5\' 3"'$  (1.6 m).   Weight as of 05/19/17: 104.3 kg.  Imaging Review Plain radiographs demonstrate failure of the right knee(s). The overall alignment is neutral.There is evidence of loosening of the femoral and tibial components. The bone quality appears to be good for age and reported activity level.      Assessment/Plan:  Right knee with failed previous arthroplasty.   The patient history, physical examination, clinical judgment of the provider and imaging studies are consistent with failure of the right knee(s), previous total knee arthroplasty. Revision total knee arthroplasty is deemed medically necessary. The treatment options including medical management, injection therapy, arthroscopy and revision arthroplasty were discussed at length. The risks and benefits of revision total knee arthroplasty were presented and reviewed. The risks due to aseptic loosening, infection, stiffness, patella tracking problems, thromboembolic complications and other imponderables were discussed. The patient acknowledged the explanation, agreed to  proceed with the plan and consent was signed. Patient is being admitted for inpatient treatment for surgery, pain control, PT, OT, prophylactic antibiotics, VTE prophylaxis, progressive ambulation and ADL's and discharge planning.The patient is planning to be discharged home.      [place] P[place] Jaylynne Birkhead   PA-C  01/23/2019, 10:29 PM

## 2019-01-27 NOTE — Patient Instructions (Addendum)
DUE TO COVID-19 ONLY ONE VISITOR IS ALLOWED TO COME WITH YOU AND STAY IN THE WAITING ROOM ONLY DURING PRE OP AND PROCEDURE DAY OF SURGERY. THE 1 VISITOR MAY VISIT WITH YOU AFTER SURGERY IN YOUR PRIVATE ROOM DURING VISITING HOURS ONLY!  YOU NEED TO HAVE A COVID 19 TEST ON 02/03/19 @ 3:00 PM, THIS TEST MUST BE DONE BEFORE SURGERY, COME  [address], [place] , [address].  ([place]) ONCE YOUR COVID TEST IS COMPLETED, PLEASE BEGIN THE QUARANTINE INSTRUCTIONS AS OUTLINED IN YOUR HANDOUT.                Breanna Shields  01/27/2019   Your procedure is scheduled on: 02-06-19    Report to [facility] Main  Entrance    Report to Short Stay at 5:30 AM     Call this number if you have problems the morning of surgery [phone]    Remember: NO SOLID FOOD AFTER MIDNIGHT THE NIGHT PRIOR TO SURGERY. NOTHING BY MOUTH EXCEPT CLEAR LIQUIDS UNTIL 4:15 AM . PLEASE FINISH G2 DRINK PER SURGEON ORDER  WHICH NEEDS TO BE COMPLETED AT 4:15 AM .   CLEAR LIQUID DIET   Foods Allowed                                                                     Foods Excluded  Coffee and tea, regular and decaf                             liquids that you cannot  Plain Jell-O any favor except red or purple                                           see through such as: Fruit ices (not with fruit pulp)                                     milk, soups, orange juice  Iced Popsicles                                    All solid food Carbonated beverages, regular and diet                                    Cranberry, grape and apple juices Sports drinks like Gatorade Lightly seasoned clear broth or consume(fat free) Sugar, honey syrup   _____________________________________________________________________     Take these medicines the morning of surgery with A SIP OF WATER: None  BRUSH YOUR TEETH MORNING OF SURGERY AND RINSE YOUR MOUTH OUT, NO CHEWING GUM CANDY OR MINTS.   DO NOT TAKE  ANY DIABETIC MEDICATIONS DAY OF YOUR SURGERY  You may not have any metal on your body including hair pins and              piercings     Do not wear jewelry, make-up, lotions, powders or perfumes, deodorant              Do not wear nail polish on your fingernails.  Do not shave  48 hours prior to surgery.                Do not bring valuables to the hospital. [place].  Contacts, dentures or bridgework may not be worn into surgery. .           You may bring an overnight bag              Special Instructions: N/A              Please read over the following fact sheets you were given: _____________________________________________________________________             How to Manage Your Diabetes Before and After Surgery  Why is it important to control my blood sugar before and after surgery? . Improving blood sugar levels before and after surgery helps healing and can limit problems. . A way of improving blood sugar control is eating a healthy diet by: o  Eating less sugar and carbohydrates o  Increasing activity/exercise o  Talking with your doctor about reaching your blood sugar goals . High blood sugars (greater than 180 mg/dL) can raise your risk of infections and slow your recovery, so you will need to focus on controlling your diabetes during the weeks before surgery. . Make sure that the doctor who takes care of your diabetes knows about your planned surgery including the date and location.  How do I manage my blood sugar before surgery? . Check your blood sugar at least 4 times a day, starting 2 days before surgery, to make sure that the level is not too high or low. o Check your blood sugar the morning of your surgery when you wake up and every 2 hours until you get to the Short Stay unit. . If your blood sugar is less than 70 mg/dL, you will need to treat for low blood sugar: o Do not take  insulin. o Treat a low blood sugar (less than 70 mg/dL) with  cup of clear juice (cranberry or apple), 4 glucose tablets, OR glucose gel. o Recheck blood sugar in 15 minutes after treatment (to make sure it is greater than 70 mg/dL). If your blood sugar is not greater than 70 mg/dL on recheck, call [phone] for further instructions. . Report your blood sugar to the short stay nurse when you get to Short Stay.  . If you are admitted to the hospital after surgery: o Your blood sugar will be checked by the staff and you will probably be given insulin after surgery (instead of oral diabetes medicines) to make sure you have good blood sugar levels. o The goal for blood sugar control after surgery is 80-180 mg/dL.   WHAT DO I DO ABOUT MY DIABETES MEDICATION?  Marland Kitchen Do not take oral diabetes medicines (pills) the morning of surgery.  . THE DAY BEFORE SURGERY, take your Actos and Trulicity injection as prescribed       . The day of surgery, do not take other diabetes injectables,  including Byetta (exenatide), Bydureon (exenatide ER), Victoza (liraglutide), or Trulicity (dulaglutide).    [place] - Preparing for Surgery Before surgery, you can play an important role.  Because skin is not sterile, your skin needs to be as free of germs as possible.  You can reduce the number of germs on your skin by washing with CHG (chlorahexidine gluconate) soap before surgery.  CHG is an antiseptic cleaner which kills germs and bonds with the skin to continue killing germs even after washing. Please DO NOT use if you have an allergy to CHG or antibacterial soaps.  If your skin becomes reddened/irritated stop using the CHG and inform your nurse when you arrive at Short Stay. Do not shave (including legs and underarms) for at least 48 hours prior to the first CHG shower.  You may shave your face/neck. Please follow these instructions carefully:  1.  Shower with CHG Soap the night before surgery and the  morning  of Surgery.  2.  If you choose to wash your hair, wash your hair first as usual with your  normal  shampoo.  3.  After you shampoo, rinse your hair and body thoroughly to remove the  shampoo.                           4.  Use CHG as you would any other liquid soap.  You can apply chg directly  to the skin and wash                       Gently with a scrungie or clean washcloth.  5.  Apply the CHG Soap to your body ONLY FROM THE NECK DOWN.   Do not use on face/ open                           Wound or open sores. Avoid contact with eyes, ears mouth and genitals (private parts).                       Wash face,  Genitals (private parts) with your normal soap.             6.  Wash thoroughly, paying special attention to the area where your surgery  will be performed.  7.  Thoroughly rinse your body with warm water from the neck down.  8.  DO NOT shower/wash with your normal soap after using and rinsing off  the CHG Soap.                9.  Pat yourself dry with a clean towel.            10.  Wear clean pajamas.            11.  Place clean sheets on your bed the night of your first shower and do not  sleep with pets. Day of Surgery : Do not apply any lotions/deodorants the morning of surgery.  Please wear clean clothes to the hospital/surgery center.  FAILURE TO FOLLOW THESE INSTRUCTIONS MAY RESULT IN THE CANCELLATION OF YOUR SURGERY PATIENT SIGNATURE_________________________________  NURSE SIGNATURE__________________________________  ________________________________________________________________________   Breanna Shields  An incentive spirometer is a tool that can help keep your lungs clear and active. This tool measures how well you are filling your lungs with each breath. Taking long deep breaths may help reverse or decrease the chance  of developing breathing (pulmonary) problems (especially infection) following:  A long period of time when you are unable to move or be  active. BEFORE THE PROCEDURE   If the spirometer includes an indicator to show your best effort, your nurse or respiratory therapist will set it to a desired goal.  If possible, sit up straight or lean slightly forward. Try not to slouch.  Hold the incentive spirometer in an upright position. INSTRUCTIONS FOR USE  1. Sit on the edge of your bed if possible, or sit up as far as you can in bed or on a chair. 2. Hold the incentive spirometer in an upright position. 3. Breathe out normally. 4. Place the mouthpiece in your mouth and seal your lips tightly around it. 5. Breathe in slowly and as deeply as possible, raising the piston or the ball toward the top of the column. 6. Hold your breath for 3-5 seconds or for as long as possible. Allow the piston or ball to fall to the bottom of the column. 7. Remove the mouthpiece from your mouth and breathe out normally. 8. Rest for a few seconds and repeat Steps 1 through 7 at least 10 times every 1-2 hours when you are awake. Take your time and take a few normal breaths between deep breaths. 9. The spirometer may include an indicator to show your best effort. Use the indicator as a goal to work toward during each repetition. 10. After each set of 10 deep breaths, practice coughing to be sure your lungs are clear. If you have an incision (the cut made at the time of surgery), support your incision when coughing by placing a pillow or rolled up towels firmly against it. Once you are able to get out of bed, walk around indoors and cough well. You may stop using the incentive spirometer when instructed by your caregiver.  RISKS AND COMPLICATIONS  Take your time so you do not get dizzy or light-headed.  If you are in pain, you may need to take or ask for pain medication before doing incentive spirometry. It is harder to take a deep breath if you are having pain. AFTER USE  Rest and breathe slowly and easily.  It can be helpful to keep track of a log of  your progress. Your caregiver can provide you with a simple table to help with this. If you are using the spirometer at home, follow these instructions: [place] IF:   You are having difficultly using the spirometer.  You have trouble using the spirometer as often as instructed.  Your pain medication is not giving enough relief while using the spirometer.  You develop fever of 100.5 F (38.1 C) or higher. SEEK IMMEDIATE MEDICAL CARE IF:   You cough up bloody sputum that had not been present before.  You develop fever of 102 F (38.9 C) or greater.  You develop worsening pain at or near the incision site. MAKE SURE YOU:   Understand these instructions.  Will watch your condition.  Will get help right away if you are not doing well or get worse. Document Released: 06/05/2006 Document Revised: 04/17/2011 Document Reviewed: 08/06/2006 ExitCare Patient Information 2014 ExitCare, [place].   ________________________________________________________________________  WHAT IS A BLOOD TRANSFUSION? Blood Transfusion Information  A transfusion is the replacement of blood or some of its parts. Blood is made up of multiple cells which provide different functions.  Red blood cells carry oxygen and are used for blood loss replacement.  White blood  cells fight against infection.  Platelets control bleeding.  Plasma helps clot blood.  Other blood products are available for specialized needs, such as hemophilia or other clotting disorders. BEFORE THE TRANSFUSION  Who gives blood for transfusions?   Healthy volunteers who are fully evaluated to make sure their blood is safe. This is blood bank blood. Transfusion therapy is the safest it has ever been in the practice of medicine. Before blood is taken from a donor, a complete history is taken to make sure that person has no history of diseases nor engages in risky social behavior (examples are intravenous drug use or sexual activity  with multiple partners). The donor's travel history is screened to minimize risk of transmitting infections, such as malaria. The donated blood is tested for signs of infectious diseases, such as HIV and hepatitis. The blood is then tested to be sure it is compatible with you in order to minimize the chance of a transfusion reaction. If you or a relative donates blood, this is often done in anticipation of surgery and is not appropriate for emergency situations. It takes many days to process the donated blood. RISKS AND COMPLICATIONS Although transfusion therapy is very safe and saves many lives, the main dangers of transfusion include:   Getting an infectious disease.  Developing a transfusion reaction. This is an allergic reaction to something in the blood you were given. Every precaution is taken to prevent this. The decision to have a blood transfusion has been considered carefully by your caregiver before blood is given. Blood is not given unless the benefits outweigh the risks. AFTER THE TRANSFUSION  Right after receiving a blood transfusion, you will usually feel much better and more energetic. This is especially true if your red blood cells have gotten low (anemic). The transfusion raises the level of the red blood cells which carry oxygen, and this usually causes an energy increase.  The nurse administering the transfusion will monitor you carefully for complications. HOME CARE INSTRUCTIONS  No special instructions are needed after a transfusion. You may find your energy is better. Speak with your caregiver about any limitations on activity for underlying diseases you may have. SEEK MEDICAL CARE IF:   Your condition is not improving after your transfusion.  You develop redness or irritation at the intravenous (IV) site. SEEK IMMEDIATE MEDICAL CARE IF:  Any of the following symptoms occur over the next 12 hours:  Shaking chills.  You have a temperature by mouth above 102 F (38.9  C), not controlled by medicine.  Chest, back, or muscle pain.  People around you feel you are not acting correctly or are confused.  Shortness of breath or difficulty breathing.  Dizziness and fainting.  You get a rash or develop hives.  You have a decrease in urine output.  Your urine turns a dark color or changes to pink, red, or brown. Any of the following symptoms occur over the next 10 days:  You have a temperature by mouth above 102 F (38.9 C), not controlled by medicine.  Shortness of breath.  Weakness after normal activity.  The white part of the eye turns yellow (jaundice).  You have a decrease in the amount of urine or are urinating less often.  Your urine turns a dark color or changes to pink, red, or brown. Document Released: 01/21/2000 Document Revised: 04/17/2011 Document Reviewed: 09/09/2007 [facility] Patient Information 2014 [place], [place].  _______________________________________________________________________

## 2019-01-28 ENCOUNTER — Encounter (HOSPITAL_COMMUNITY): Payer: Self-pay

## 2019-01-28 ENCOUNTER — Other Ambulatory Visit: Payer: Self-pay

## 2019-01-28 ENCOUNTER — Encounter (HOSPITAL_COMMUNITY)
Admission: RE | Admit: 2019-01-28 | Discharge: 2019-01-28 | Disposition: A | Discharge status: Home / Self Care | Payer: HMO | Source: Ambulatory Visit | Attending: Orthopedic Surgery | Admitting: Orthopedic Surgery

## 2019-01-28 DIAGNOSIS — E119 Type 2 diabetes mellitus without complications: Secondary | ICD-10-CM | POA: Insufficient documentation

## 2019-01-28 DIAGNOSIS — Z01818 Encounter for other preprocedural examination: Secondary | ICD-10-CM | POA: Diagnosis not present

## 2019-01-28 DIAGNOSIS — I10 Essential (primary) hypertension: Secondary | ICD-10-CM | POA: Insufficient documentation

## 2019-01-28 NOTE — Progress Notes (Signed)
PCP - Legrand Como Altheemer Cardiologist -   Chest x-ray -  EKG -  Stress Test -  ECHO -  Cardiac Cath -   Sleep Study -  CPAP -   Fasting Blood Sugar -  Checks Blood Sugar _____ times a day  Blood Thinner Instructions: Aspirin Instructions: Last Dose:  Anesthesia review:   Patient denies shortness of breath, fever, cough and chest pain at PAT appointment   Patient verbalized understanding of instructions that were given to them at the PAT appointment. Patient was also instructed that they will need to review over the PAT instructions again at home before surgery.

## 2019-01-29 ENCOUNTER — Encounter (HOSPITAL_COMMUNITY)
Admission: RE | Admit: 2019-01-29 | Discharge: 2019-01-29 | Disposition: A | Discharge status: Home / Self Care | Payer: HMO | Source: Ambulatory Visit | Attending: Orthopedic Surgery | Admitting: Orthopedic Surgery

## 2019-01-29 DIAGNOSIS — I10 Essential (primary) hypertension: Secondary | ICD-10-CM | POA: Diagnosis not present

## 2019-01-29 DIAGNOSIS — Z01818 Encounter for other preprocedural examination: Secondary | ICD-10-CM | POA: Diagnosis not present

## 2019-01-29 DIAGNOSIS — E119 Type 2 diabetes mellitus without complications: Secondary | ICD-10-CM | POA: Diagnosis not present

## 2019-01-29 LAB — BASIC METABOLIC PANEL
Anion gap: 8 (ref 5–15)
BUN: 20 mg/dL (ref 8–23)
CO2: 23 mmol/L (ref 22–32)
Calcium: 9.3 mg/dL (ref 8.9–10.3)
Chloride: 110 mmol/L (ref 98–111)
Creatinine, Ser: 0.85 mg/dL (ref 0.44–1.00)
GFR calc Af Amer: >60 mL/min (ref >60)
GFR calc non Af Amer: >60 mL/min (ref >60)
Glucose, Bld: 173 mg/dL — ABNORMAL HIGH (ref 70–99)
Potassium: 4.3 mmol/L (ref 3.5–5.1)
Sodium: 141 mmol/L (ref 135–145)

## 2019-01-29 LAB — SURGICAL PCR SCREEN
MRSA, PCR: NEGATIVE
Staphylococcus aureus: NEGATIVE

## 2019-01-29 LAB — CBC
HCT: 40.1 % (ref 36.0–46.0)
Hemoglobin: 12.6 g/dL (ref 12.0–15.0)
MCH: 30 pg (ref 26.0–34.0)
MCHC: 31.4 g/dL (ref 30.0–36.0)
MCV: 95.5 fL (ref 80.0–100.0)
Platelets: 291 10*3/uL (ref 150–400)
RBC: 4.2 MIL/uL (ref 3.87–5.11)
RDW: 14.3 % (ref 11.5–15.5)
WBC: 9.2 10*3/uL (ref 4.0–10.5)
nRBC: 0 % (ref 0.0–0.2)

## 2019-01-29 LAB — GLUCOSE, CAPILLARY: Glucose-Capillary: 165 mg/dL — ABNORMAL HIGH (ref 70–99)

## 2019-01-29 LAB — HEMOGLOBIN A1C
Hgb A1c MFr Bld: 6.8 % — ABNORMAL HIGH (ref 4.8–5.6)
Mean Plasma Glucose: 148.46 mg/dL

## 2019-01-29 LAB — ABO/RH: ABO/RH(D): A NEG

## 2019-02-03 ENCOUNTER — Other Ambulatory Visit (HOSPITAL_COMMUNITY)
Admission: RE | Admit: 2019-02-03 | Discharge: 2019-02-03 | Disposition: A | Discharge status: Home / Self Care | Payer: HMO | Source: Ambulatory Visit | Attending: Orthopedic Surgery | Admitting: Orthopedic Surgery

## 2019-02-03 DIAGNOSIS — I10 Essential (primary) hypertension: Secondary | ICD-10-CM | POA: Diagnosis present

## 2019-02-03 DIAGNOSIS — Y831 Surgical operation with implant of artificial internal device as the cause of abnormal reaction of the patient, or of later complication, without mention of misadventure at the time of the procedure: Secondary | ICD-10-CM | POA: Diagnosis present

## 2019-02-03 DIAGNOSIS — Z96652 Presence of left artificial knee joint: Secondary | ICD-10-CM | POA: Diagnosis present

## 2019-02-03 DIAGNOSIS — Z20828 Contact with and (suspected) exposure to other viral communicable diseases: Secondary | ICD-10-CM | POA: Insufficient documentation

## 2019-02-03 DIAGNOSIS — I509 Heart failure, unspecified: Secondary | ICD-10-CM | POA: Diagnosis not present

## 2019-02-03 DIAGNOSIS — Z79899 Other long term (current) drug therapy: Secondary | ICD-10-CM | POA: Diagnosis not present

## 2019-02-03 DIAGNOSIS — Z01812 Encounter for preprocedural laboratory examination: Secondary | ICD-10-CM | POA: Insufficient documentation

## 2019-02-03 DIAGNOSIS — T84032A Mechanical loosening of internal right knee prosthetic joint, initial encounter: Secondary | ICD-10-CM | POA: Diagnosis present

## 2019-02-03 DIAGNOSIS — I110 Hypertensive heart disease with heart failure: Secondary | ICD-10-CM | POA: Diagnosis not present

## 2019-02-03 DIAGNOSIS — E119 Type 2 diabetes mellitus without complications: Secondary | ICD-10-CM | POA: Diagnosis present

## 2019-02-03 DIAGNOSIS — R112 Nausea with vomiting, unspecified: Secondary | ICD-10-CM | POA: Diagnosis not present

## 2019-02-03 DIAGNOSIS — E1165 Type 2 diabetes mellitus with hyperglycemia: Secondary | ICD-10-CM | POA: Diagnosis not present

## 2019-02-03 DIAGNOSIS — Z8249 Family history of ischemic heart disease and other diseases of the circulatory system: Secondary | ICD-10-CM | POA: Diagnosis not present

## 2019-02-03 DIAGNOSIS — Z6841 Body Mass Index (BMI) 40.0 and over, adult: Secondary | ICD-10-CM | POA: Diagnosis not present

## 2019-02-03 DIAGNOSIS — M79661 Pain in right lower leg: Secondary | ICD-10-CM | POA: Diagnosis not present

## 2019-02-03 DIAGNOSIS — Z87891 Personal history of nicotine dependence: Secondary | ICD-10-CM | POA: Diagnosis not present

## 2019-02-03 DIAGNOSIS — M79662 Pain in left lower leg: Secondary | ICD-10-CM | POA: Diagnosis not present

## 2019-02-03 DIAGNOSIS — Z7984 Long term (current) use of oral hypoglycemic drugs: Secondary | ICD-10-CM | POA: Diagnosis not present

## 2019-02-03 DIAGNOSIS — T84092A Other mechanical complication of internal right knee prosthesis, initial encounter: Secondary | ICD-10-CM | POA: Diagnosis not present

## 2019-02-03 DIAGNOSIS — G8918 Other acute postprocedural pain: Secondary | ICD-10-CM | POA: Diagnosis not present

## 2019-02-03 DIAGNOSIS — Z20822 Contact with and (suspected) exposure to covid-19: Secondary | ICD-10-CM | POA: Diagnosis present

## 2019-02-03 DIAGNOSIS — R519 Headache, unspecified: Secondary | ICD-10-CM | POA: Diagnosis not present

## 2019-02-03 DIAGNOSIS — Z7982 Long term (current) use of aspirin: Secondary | ICD-10-CM | POA: Diagnosis not present

## 2019-02-03 DIAGNOSIS — Z96651 Presence of right artificial knee joint: Secondary | ICD-10-CM | POA: Diagnosis not present

## 2019-02-03 DIAGNOSIS — E782 Mixed hyperlipidemia: Secondary | ICD-10-CM | POA: Diagnosis present

## 2019-02-04 LAB — NOVEL CORONAVIRUS, NAA (HOSP ORDER, SEND-OUT TO REF LAB; TAT 18-24 HRS): SARS-CoV-2, NAA: NOT DETECTED

## 2019-02-05 DIAGNOSIS — E1165 Type 2 diabetes mellitus with hyperglycemia: Secondary | ICD-10-CM | POA: Diagnosis not present

## 2019-02-05 DIAGNOSIS — M79662 Pain in left lower leg: Secondary | ICD-10-CM | POA: Diagnosis not present

## 2019-02-05 DIAGNOSIS — M79661 Pain in right lower leg: Secondary | ICD-10-CM | POA: Diagnosis not present

## 2019-02-05 NOTE — Progress Notes (Signed)
Pt advised that the scheduled surgery time for her 02-06-19 surgery has changed from 7:15 AM to 11:27 AM. Pt to arrive to Admitting at 9:00 AM,and complete her G2 Drink by 8:30 AM. Pt verbalized understanding.

## 2019-02-06 ENCOUNTER — Inpatient Hospital Stay (HOSPITAL_COMMUNITY): Payer: HMO | Admitting: Certified Registered Nurse Anesthetist

## 2019-02-06 ENCOUNTER — Other Ambulatory Visit: Payer: Self-pay

## 2019-02-06 ENCOUNTER — Encounter (HOSPITAL_COMMUNITY)
Admission: RE | Disposition: A | Discharge status: Home Health | Payer: Self-pay | Source: Home / Self Care | Attending: Orthopedic Surgery

## 2019-02-06 ENCOUNTER — Inpatient Hospital Stay (HOSPITAL_COMMUNITY)
Admission: RE | Admit: 2019-02-06 | Discharge: 2019-02-08 | DRG: 467 | Disposition: A | Discharge status: Home Health | Payer: HMO | Attending: Orthopedic Surgery | Admitting: Orthopedic Surgery

## 2019-02-06 ENCOUNTER — Encounter (HOSPITAL_COMMUNITY): Payer: Self-pay | Admitting: Orthopedic Surgery

## 2019-02-06 ENCOUNTER — Inpatient Hospital Stay (HOSPITAL_COMMUNITY): Payer: HMO | Admitting: Physician Assistant

## 2019-02-06 DIAGNOSIS — Z6841 Body Mass Index (BMI) 40.0 and over, adult: Secondary | ICD-10-CM

## 2019-02-06 DIAGNOSIS — Z87891 Personal history of nicotine dependence: Secondary | ICD-10-CM | POA: Diagnosis not present

## 2019-02-06 DIAGNOSIS — Z7982 Long term (current) use of aspirin: Secondary | ICD-10-CM | POA: Diagnosis not present

## 2019-02-06 DIAGNOSIS — Z96652 Presence of left artificial knee joint: Secondary | ICD-10-CM | POA: Diagnosis present

## 2019-02-06 DIAGNOSIS — E782 Mixed hyperlipidemia: Secondary | ICD-10-CM | POA: Diagnosis present

## 2019-02-06 DIAGNOSIS — Z96651 Presence of right artificial knee joint: Secondary | ICD-10-CM

## 2019-02-06 DIAGNOSIS — R519 Headache, unspecified: Secondary | ICD-10-CM | POA: Diagnosis not present

## 2019-02-06 DIAGNOSIS — Z20822 Contact with and (suspected) exposure to covid-19: Secondary | ICD-10-CM | POA: Diagnosis present

## 2019-02-06 DIAGNOSIS — I10 Essential (primary) hypertension: Secondary | ICD-10-CM | POA: Diagnosis present

## 2019-02-06 DIAGNOSIS — Z8249 Family history of ischemic heart disease and other diseases of the circulatory system: Secondary | ICD-10-CM | POA: Diagnosis not present

## 2019-02-06 DIAGNOSIS — Z79899 Other long term (current) drug therapy: Secondary | ICD-10-CM | POA: Diagnosis not present

## 2019-02-06 DIAGNOSIS — R112 Nausea with vomiting, unspecified: Secondary | ICD-10-CM | POA: Diagnosis not present

## 2019-02-06 DIAGNOSIS — Y831 Surgical operation with implant of artificial internal device as the cause of abnormal reaction of the patient, or of later complication, without mention of misadventure at the time of the procedure: Secondary | ICD-10-CM | POA: Diagnosis present

## 2019-02-06 DIAGNOSIS — T84032A Mechanical loosening of internal right knee prosthetic joint, initial encounter: Secondary | ICD-10-CM | POA: Diagnosis present

## 2019-02-06 DIAGNOSIS — Z7984 Long term (current) use of oral hypoglycemic drugs: Secondary | ICD-10-CM

## 2019-02-06 DIAGNOSIS — E119 Type 2 diabetes mellitus without complications: Secondary | ICD-10-CM | POA: Diagnosis present

## 2019-02-06 HISTORY — PX: TOTAL KNEE REVISION: SHX996

## 2019-02-06 LAB — GLUCOSE, CAPILLARY
Glucose-Capillary: 191 mg/dL — ABNORMAL HIGH (ref 70–99)
Glucose-Capillary: 149 mg/dL — ABNORMAL HIGH (ref 70–99)
Glucose-Capillary: 176 mg/dL — ABNORMAL HIGH (ref 70–99)
Glucose-Capillary: 249 mg/dL — ABNORMAL HIGH (ref 70–99)

## 2019-02-06 LAB — TYPE AND SCREEN
ABO/RH(D): A NEG
Antibody Screen: NEGATIVE

## 2019-02-06 SURGERY — TOTAL KNEE REVISION
Anesthesia: Spinal | Site: Knee | Laterality: Right

## 2019-02-06 MED ORDER — SODIUM CHLORIDE (PF) 0.9 % IJ SOLN
INTRAMUSCULAR | Status: DC | PRN
  Administered 2019-02-06: 30 mL

## 2019-02-06 MED ORDER — SODIUM CHLORIDE (PF) 0.9 % IJ SOLN
INTRAMUSCULAR | Status: AC
  Filled 2019-02-06: qty 50

## 2019-02-06 MED ORDER — IRBESARTAN 150 MG PO TABS
300.0000 mg | ORAL_TABLET | Freq: Every day | ORAL | Status: DC
  Administered 2019-02-06 – 2019-02-08 (×3): 300 mg via ORAL
  Filled 2019-02-06 (×3): qty 2

## 2019-02-06 MED ORDER — MAGNESIUM CITRATE PO SOLN: 1.0000 | Freq: Once | ORAL | Status: DC | PRN

## 2019-02-06 MED ORDER — CEFAZOLIN SODIUM-DEXTROSE 2-4 GM/100ML-% IV SOLN
2.0000 g | Freq: Four times a day (QID) | INTRAVENOUS | Status: AC
  Administered 2019-02-06 (×2): 2 g via INTRAVENOUS
  Filled 2019-02-06 (×2): qty 100

## 2019-02-06 MED ORDER — TRANEXAMIC ACID-NACL 1000-0.7 MG/100ML-% IV SOLN
1000.0000 mg | INTRAVENOUS | Status: AC
  Administered 2019-02-06: 1000 mg via INTRAVENOUS
  Filled 2019-02-06: qty 100

## 2019-02-06 MED ORDER — KETOROLAC TROMETHAMINE 30 MG/ML IJ SOLN
INTRAMUSCULAR | Status: AC
  Filled 2019-02-06: qty 1

## 2019-02-06 MED ORDER — FERROUS SULFATE 325 (65 FE) MG PO TABS
325.0000 mg | ORAL_TABLET | Freq: Two times a day (BID) | ORAL | Status: DC
  Administered 2019-02-07 – 2019-02-08 (×3): 325 mg via ORAL
  Filled 2019-02-06 (×3): qty 1

## 2019-02-06 MED ORDER — PROPOFOL 500 MG/50ML IV EMUL
INTRAVENOUS | Status: AC
  Filled 2019-02-06: qty 50

## 2019-02-06 MED ORDER — DEXAMETHASONE SODIUM PHOSPHATE 10 MG/ML IJ SOLN
INTRAMUSCULAR | Status: DC | PRN
  Administered 2019-02-06: 10 mg via INTRAVENOUS

## 2019-02-06 MED ORDER — POLYETHYLENE GLYCOL 3350 17 G PO PACK
17.0000 g | PACK | Freq: Two times a day (BID) | ORAL | Status: DC
  Filled 2019-02-06 (×2): qty 1

## 2019-02-06 MED ORDER — PROPOFOL 10 MG/ML IV BOLUS
INTRAVENOUS | Status: AC
  Filled 2019-02-06: qty 20

## 2019-02-06 MED ORDER — DEXAMETHASONE SODIUM PHOSPHATE 10 MG/ML IJ SOLN: 10.0000 mg | Freq: Once | INTRAMUSCULAR | Status: DC

## 2019-02-06 MED ORDER — MEPERIDINE HCL 50 MG/ML IJ SOLN: 6.2500 mg | INTRAMUSCULAR | Status: DC | PRN

## 2019-02-06 MED ORDER — METHOCARBAMOL 500 MG IVPB - SIMPLE MED
500.0000 mg | Freq: Four times a day (QID) | INTRAVENOUS | Status: DC | PRN
  Filled 2019-02-06: qty 50

## 2019-02-06 MED ORDER — DIPHENHYDRAMINE HCL 12.5 MG/5ML PO ELIX: 12.5000 mg | ORAL_SOLUTION | ORAL | Status: DC | PRN

## 2019-02-06 MED ORDER — FENTANYL CITRATE (PF) 100 MCG/2ML IJ SOLN
50.0000 ug | INTRAMUSCULAR | Status: DC
  Administered 2019-02-06: 50 ug via INTRAVENOUS
  Filled 2019-02-06: qty 2

## 2019-02-06 MED ORDER — INSULIN ASPART 100 UNIT/ML ~~LOC~~ SOLN
0.0000 [IU] | Freq: Three times a day (TID) | SUBCUTANEOUS | Status: DC
  Administered 2019-02-06 – 2019-02-07 (×2): 3 [IU] via SUBCUTANEOUS
  Administered 2019-02-07: 5 [IU] via SUBCUTANEOUS

## 2019-02-06 MED ORDER — INSULIN ASPART 100 UNIT/ML ~~LOC~~ SOLN
0.0000 [IU] | Freq: Every day | SUBCUTANEOUS | Status: DC
  Administered 2019-02-06: 2 [IU] via SUBCUTANEOUS

## 2019-02-06 MED ORDER — ONDANSETRON HCL 4 MG/2ML IJ SOLN
INTRAMUSCULAR | Status: DC | PRN
  Administered 2019-02-06: 4 mg via INTRAVENOUS

## 2019-02-06 MED ORDER — MIDAZOLAM HCL 2 MG/2ML IJ SOLN
1.0000 mg | INTRAMUSCULAR | Status: DC
  Administered 2019-02-06: 1 mg via INTRAVENOUS
  Filled 2019-02-06: qty 2

## 2019-02-06 MED ORDER — ACETAMINOPHEN 325 MG PO TABS
325.0000 mg | ORAL_TABLET | Freq: Four times a day (QID) | ORAL | Status: DC | PRN
  Administered 2019-02-07 (×3): 650 mg via ORAL
  Filled 2019-02-06 (×2): qty 2
  Filled 2019-02-06: qty 1
  Filled 2019-02-06: qty 2

## 2019-02-06 MED ORDER — POVIDONE-IODINE 10 % EX SWAB
2.0000 "application " | Freq: Once | CUTANEOUS | Status: AC
  Administered 2019-02-06: 2 via TOPICAL

## 2019-02-06 MED ORDER — HYDROMORPHONE HCL 1 MG/ML IJ SOLN: 0.5000 mg | INTRAMUSCULAR | Status: DC | PRN

## 2019-02-06 MED ORDER — HYDROCODONE-ACETAMINOPHEN 7.5-325 MG PO TABS
1.0000 | ORAL_TABLET | ORAL | Status: DC | PRN
  Administered 2019-02-08 (×2): 1 via ORAL
  Filled 2019-02-06 (×2): qty 1

## 2019-02-06 MED ORDER — STERILE WATER FOR IRRIGATION IR SOLN
Status: DC | PRN
  Administered 2019-02-06: 2000 mL

## 2019-02-06 MED ORDER — PIOGLITAZONE HCL 30 MG PO TABS
30.0000 mg | ORAL_TABLET | Freq: Every day | ORAL | Status: DC
  Administered 2019-02-07 – 2019-02-08 (×2): 30 mg via ORAL
  Filled 2019-02-06 (×2): qty 1

## 2019-02-06 MED ORDER — BUPIVACAINE HCL (PF) 0.25 % IJ SOLN
INTRAMUSCULAR | Status: DC | PRN
  Administered 2019-02-06: 30 mL via INTRA_ARTICULAR

## 2019-02-06 MED ORDER — LACTATED RINGERS IV SOLN: INTRAVENOUS | Status: DC | PRN

## 2019-02-06 MED ORDER — HYDROMORPHONE HCL 1 MG/ML IJ SOLN: 0.2500 mg | INTRAMUSCULAR | Status: DC | PRN

## 2019-02-06 MED ORDER — ALUM & MAG HYDROXIDE-SIMETH 200-200-20 MG/5ML PO SUSP: 15.0000 mL | ORAL | Status: DC | PRN

## 2019-02-06 MED ORDER — ASPIRIN 81 MG PO CHEW
81.0000 mg | CHEWABLE_TABLET | Freq: Two times a day (BID) | ORAL | Status: DC
  Administered 2019-02-06 – 2019-02-08 (×4): 81 mg via ORAL
  Filled 2019-02-06 (×4): qty 1

## 2019-02-06 MED ORDER — ROPIVACAINE HCL 5 MG/ML IJ SOLN
INTRAMUSCULAR | Status: DC | PRN
  Administered 2019-02-06 (×2): 5 mL via PERINEURAL

## 2019-02-06 MED ORDER — ONDANSETRON HCL 4 MG/2ML IJ SOLN: 4.0000 mg | Freq: Once | INTRAMUSCULAR | Status: DC | PRN

## 2019-02-06 MED ORDER — ATORVASTATIN CALCIUM 40 MG PO TABS
40.0000 mg | ORAL_TABLET | Freq: Every evening | ORAL | Status: DC
  Administered 2019-02-06 – 2019-02-07 (×2): 40 mg via ORAL
  Filled 2019-02-06 (×2): qty 1

## 2019-02-06 MED ORDER — DEXAMETHASONE SODIUM PHOSPHATE 10 MG/ML IJ SOLN
10.0000 mg | Freq: Once | INTRAMUSCULAR | Status: AC
  Administered 2019-02-07: 10 mg via INTRAVENOUS
  Filled 2019-02-06: qty 1

## 2019-02-06 MED ORDER — TOBRAMYCIN SULFATE 1.2 G IJ SOLR
INTRAMUSCULAR | Status: AC
  Filled 2019-02-06: qty 1.2

## 2019-02-06 MED ORDER — LACTATED RINGERS IV SOLN: INTRAVENOUS | Status: DC

## 2019-02-06 MED ORDER — BISACODYL 10 MG RE SUPP: 10.0000 mg | Freq: Every day | RECTAL | Status: DC | PRN

## 2019-02-06 MED ORDER — PHENOL 1.4 % MT LIQD: 1.0000 | OROMUCOSAL | Status: DC | PRN

## 2019-02-06 MED ORDER — LIDOCAINE 2% (20 MG/ML) 5 ML SYRINGE
INTRAMUSCULAR | Status: AC
  Filled 2019-02-06: qty 5

## 2019-02-06 MED ORDER — ONDANSETRON HCL 4 MG/2ML IJ SOLN
4.0000 mg | Freq: Four times a day (QID) | INTRAMUSCULAR | Status: DC | PRN
  Administered 2019-02-06: 4 mg via INTRAVENOUS
  Filled 2019-02-06: qty 2

## 2019-02-06 MED ORDER — PHENYLEPHRINE HCL-NACL 10-0.9 MG/250ML-% IV SOLN
INTRAVENOUS | Status: DC | PRN
  Administered 2019-02-06: 50 ug/min via INTRAVENOUS

## 2019-02-06 MED ORDER — DEXAMETHASONE SODIUM PHOSPHATE 10 MG/ML IJ SOLN
INTRAMUSCULAR | Status: AC
  Filled 2019-02-06: qty 1

## 2019-02-06 MED ORDER — BUPIVACAINE IN DEXTROSE 0.75-8.25 % IT SOLN
INTRATHECAL | Status: DC | PRN
  Administered 2019-02-06: 1.4 mL via INTRATHECAL

## 2019-02-06 MED ORDER — BUPIVACAINE HCL 0.25 % IJ SOLN
INTRAMUSCULAR | Status: AC
  Filled 2019-02-06: qty 1

## 2019-02-06 MED ORDER — METOCLOPRAMIDE HCL 5 MG/ML IJ SOLN: 5.0000 mg | Freq: Three times a day (TID) | INTRAMUSCULAR | Status: DC | PRN

## 2019-02-06 MED ORDER — KETOROLAC TROMETHAMINE 30 MG/ML IJ SOLN
INTRAMUSCULAR | Status: DC | PRN
  Administered 2019-02-06: 30 mg via INTRA_ARTICULAR

## 2019-02-06 MED ORDER — 0.9 % SODIUM CHLORIDE (POUR BTL) OPTIME
TOPICAL | Status: DC | PRN
  Administered 2019-02-06: 1000 mL

## 2019-02-06 MED ORDER — MENTHOL 3 MG MT LOZG: 1.0000 | LOZENGE | OROMUCOSAL | Status: DC | PRN

## 2019-02-06 MED ORDER — METOCLOPRAMIDE HCL 5 MG PO TABS: 5.0000 mg | ORAL_TABLET | Freq: Three times a day (TID) | ORAL | Status: DC | PRN

## 2019-02-06 MED ORDER — VANCOMYCIN HCL 1000 MG IV SOLR
INTRAVENOUS | Status: AC
  Filled 2019-02-06: qty 1000

## 2019-02-06 MED ORDER — ONDANSETRON HCL 4 MG/2ML IJ SOLN
INTRAMUSCULAR | Status: AC
  Filled 2019-02-06: qty 2

## 2019-02-06 MED ORDER — SODIUM CHLORIDE 0.9 % IR SOLN
Status: DC | PRN
  Administered 2019-02-06: 3000 mL

## 2019-02-06 MED ORDER — PROPOFOL 10 MG/ML IV BOLUS
INTRAVENOUS | Status: DC | PRN
  Administered 2019-02-06 (×2): 20 mg via INTRAVENOUS

## 2019-02-06 MED ORDER — DOCUSATE SODIUM 100 MG PO CAPS
100.0000 mg | ORAL_CAPSULE | Freq: Two times a day (BID) | ORAL | Status: DC
  Administered 2019-02-06 – 2019-02-07 (×2): 100 mg via ORAL
  Filled 2019-02-06 (×4): qty 1

## 2019-02-06 MED ORDER — CELECOXIB 200 MG PO CAPS
200.0000 mg | ORAL_CAPSULE | Freq: Two times a day (BID) | ORAL | Status: DC
  Administered 2019-02-06 – 2019-02-08 (×4): 200 mg via ORAL
  Filled 2019-02-06 (×4): qty 1

## 2019-02-06 MED ORDER — HYDROCODONE-ACETAMINOPHEN 5-325 MG PO TABS
1.0000 | ORAL_TABLET | ORAL | Status: DC | PRN
  Administered 2019-02-06: 1 via ORAL
  Administered 2019-02-06: 2 via ORAL
  Administered 2019-02-07: 1 via ORAL
  Filled 2019-02-06: qty 1
  Filled 2019-02-06: qty 2
  Filled 2019-02-06: qty 1

## 2019-02-06 MED ORDER — ROPIVACAINE HCL 7.5 MG/ML IJ SOLN
INTRAMUSCULAR | Status: DC | PRN
  Administered 2019-02-06 (×4): 5 mL via PERINEURAL

## 2019-02-06 MED ORDER — ACETAMINOPHEN 10 MG/ML IV SOLN: 1000.0000 mg | Freq: Once | INTRAVENOUS | Status: DC | PRN

## 2019-02-06 MED ORDER — METHOCARBAMOL 500 MG PO TABS
500.0000 mg | ORAL_TABLET | Freq: Four times a day (QID) | ORAL | Status: DC | PRN
  Administered 2019-02-06 – 2019-02-08 (×4): 500 mg via ORAL
  Filled 2019-02-06 (×4): qty 1

## 2019-02-06 MED ORDER — CHLORHEXIDINE GLUCONATE 4 % EX LIQD: 60.0000 mL | Freq: Once | CUTANEOUS | Status: DC

## 2019-02-06 MED ORDER — LIDOCAINE HCL (CARDIAC) PF 100 MG/5ML IV SOSY
PREFILLED_SYRINGE | INTRAVENOUS | Status: DC | PRN
  Administered 2019-02-06: 80 mg via INTRAVENOUS

## 2019-02-06 MED ORDER — CEFAZOLIN SODIUM-DEXTROSE 2-4 GM/100ML-% IV SOLN
2.0000 g | INTRAVENOUS | Status: AC
  Administered 2019-02-06: 2 g via INTRAVENOUS
  Filled 2019-02-06: qty 100

## 2019-02-06 MED ORDER — CLONIDINE HCL (ANALGESIA) 100 MCG/ML EP SOLN
EPIDURAL | Status: DC | PRN
  Administered 2019-02-06: 100 ug

## 2019-02-06 MED ORDER — TRANEXAMIC ACID-NACL 1000-0.7 MG/100ML-% IV SOLN
1000.0000 mg | Freq: Once | INTRAVENOUS | Status: AC
  Administered 2019-02-06: 1000 mg via INTRAVENOUS
  Filled 2019-02-06: qty 100

## 2019-02-06 MED ORDER — ONDANSETRON HCL 4 MG PO TABS: 4.0000 mg | ORAL_TABLET | Freq: Four times a day (QID) | ORAL | Status: DC | PRN

## 2019-02-06 MED ORDER — PROPOFOL 500 MG/50ML IV EMUL
INTRAVENOUS | Status: DC | PRN
  Administered 2019-02-06: 75 ug/kg/min via INTRAVENOUS

## 2019-02-06 MED ORDER — SODIUM CHLORIDE 0.9 % IV SOLN: INTRAVENOUS | Status: DC

## 2019-02-06 SURGICAL SUPPLY — 74 items
ADH SKN CLS APL DERMABOND .7 (GAUZE/BANDAGES/DRESSINGS) ×1
ATTUNE PSRP INSR SZ4 8 KNEE (Insert) ×1 IMPLANT
AUG FEM SZ4 4 REV POST STRL LF (Miscellaneous) ×2 IMPLANT
AUGMENT POST FEM SZ4 4 (Miscellaneous) ×2 IMPLANT
BAG DECANTER FOR FLEXI CONT (MISCELLANEOUS) IMPLANT
BAG SPEC THK2 15X12 ZIP CLS (MISCELLANEOUS)
BAG ZIPLOCK 12X15 (MISCELLANEOUS) IMPLANT
BLADE SAW SGTL 11.0X1.19X90.0M (BLADE) IMPLANT
BLADE SAW SGTL 13.0X1.19X90.0M (BLADE) ×2 IMPLANT
BLADE SAW SGTL 81X20 HD (BLADE) ×2 IMPLANT
BLADE SURG SZ10 CARB STEEL (BLADE) ×4 IMPLANT
BNDG ELASTIC 6X5.8 VLCR STR LF (GAUZE/BANDAGES/DRESSINGS) ×2 IMPLANT
BRUSH FEMORAL CANAL (MISCELLANEOUS) IMPLANT
BSPLAT TIB 3 CMNT REV ROT PLAT (Knees) ×1 IMPLANT
CEMENT HV SMART SET (Cement) ×1 IMPLANT
COMP FEM ATTUNE CRS CEM RT SZ4 (Femur) ×2 IMPLANT
COMPONENT FEM ATN CR CEM RTSZ4 (Femur) IMPLANT
COVER SURGICAL LIGHT HANDLE (MISCELLANEOUS) ×2 IMPLANT
COVER WAND RF STERILE (DRAPES) ×1 IMPLANT
CUFF TOURN SGL QUICK 34 (TOURNIQUET CUFF) ×2
CUFF TRNQT CYL 34X4.125X (TOURNIQUET CUFF) ×1 IMPLANT
DECANTER SPIKE VIAL GLASS SM (MISCELLANEOUS) IMPLANT
DERMABOND ADVANCED (GAUZE/BANDAGES/DRESSINGS) ×1
DERMABOND ADVANCED .7 DNX12 (GAUZE/BANDAGES/DRESSINGS) ×1 IMPLANT
DRAPE INCISE IOBAN 66X45 STRL (DRAPES) ×1 IMPLANT
DRAPE U-SHAPE 47X51 STRL (DRAPES) ×2 IMPLANT
DRESSING AQUACEL AG SP 3.5X10 (GAUZE/BANDAGES/DRESSINGS) IMPLANT
DRSG AQUACEL AG ADV 3.5X10 (GAUZE/BANDAGES/DRESSINGS) ×2 IMPLANT
DRSG AQUACEL AG ADV 3.5X14 (GAUZE/BANDAGES/DRESSINGS) ×1 IMPLANT
DRSG AQUACEL AG SP 3.5X10 (GAUZE/BANDAGES/DRESSINGS)
DURAPREP 26ML APPLICATOR (WOUND CARE) ×4 IMPLANT
ELECT REM PT RETURN 15FT ADLT (MISCELLANEOUS) ×2 IMPLANT
GAUZE SPONGE 2X2 8PLY STRL LF (GAUZE/BANDAGES/DRESSINGS) IMPLANT
GLOVE BIOGEL PI IND STRL 7.5 (GLOVE) ×1 IMPLANT
GLOVE BIOGEL PI IND STRL 8.5 (GLOVE) ×1 IMPLANT
GLOVE BIOGEL PI INDICATOR 7.5 (GLOVE) ×1
GLOVE BIOGEL PI INDICATOR 8.5 (GLOVE) ×1
GLOVE ECLIPSE 8.0 STRL XLNG CF (GLOVE) ×4 IMPLANT
GLOVE ORTHO TXT STRL SZ7.5 (GLOVE) ×2 IMPLANT
GOWN STRL REUS W/TWL 2XL LVL3 (GOWN DISPOSABLE) ×2 IMPLANT
GOWN STRL REUS W/TWL LRG LVL3 (GOWN DISPOSABLE) ×2 IMPLANT
GOWN STRL REUS W/TWL XL LVL3 (GOWN DISPOSABLE) IMPLANT
HANDPIECE INTERPULSE COAX TIP (DISPOSABLE) ×2
HOLDER FOLEY CATH W/STRAP (MISCELLANEOUS) IMPLANT
INSERT TIB CMT ATTUNE RP SZ3 (Knees) ×1 IMPLANT
KIT TURNOVER KIT A (KITS) IMPLANT
MANIFOLD NEPTUNE II (INSTRUMENTS) ×2 IMPLANT
NDL SAFETY ECLIPSE 18X1.5 (NEEDLE) IMPLANT
NEEDLE HYPO 18GX1.5 SHARP (NEEDLE) ×2
NS IRRIG 1000ML POUR BTL (IV SOLUTION) ×2 IMPLANT
PACK TOTAL KNEE CUSTOM (KITS) ×2 IMPLANT
PENCIL SMOKE EVACUATOR (MISCELLANEOUS) ×1 IMPLANT
PIN FIX SIGMA LCS THRD HI (PIN) ×1 IMPLANT
PROTECTOR NERVE ULNAR (MISCELLANEOUS) ×2 IMPLANT
SET HNDPC FAN SPRY TIP SCT (DISPOSABLE) ×1 IMPLANT
SET PAD KNEE POSITIONER (MISCELLANEOUS) ×2 IMPLANT
SLEEVE KNEE ATTUNE 29MM (Knees) ×1 IMPLANT
SPONGE GAUZE 2X2 STER 10/PKG (GAUZE/BANDAGES/DRESSINGS)
STAPLER VISISTAT 35W (STAPLE) IMPLANT
STEM STR ATTUNE PF 14X60 (Knees) ×2 IMPLANT
SUT MNCRL AB 3-0 PS2 18 (SUTURE) ×2 IMPLANT
SUT STRATAFIX 0 PDS 27 VIOLET (SUTURE) ×2
SUT STRATAFIX PDS+ 0 24IN (SUTURE) ×2 IMPLANT
SUT VIC AB 1 CT1 36 (SUTURE) ×3 IMPLANT
SUT VIC AB 2-0 CT1 27 (SUTURE) ×6
SUT VIC AB 2-0 CT1 TAPERPNT 27 (SUTURE) ×3 IMPLANT
SUTURE STRATFX 0 PDS 27 VIOLET (SUTURE) IMPLANT
SYR 50ML LL SCALE MARK (SYRINGE) IMPLANT
TOWER CARTRIDGE SMART MIX (DISPOSABLE) ×2 IMPLANT
TRAY FOLEY MTR SLVR 16FR STAT (SET/KITS/TRAYS/PACK) ×2 IMPLANT
TUBE KAMVAC SUCTION (TUBING) IMPLANT
WATER STERILE IRR 1000ML POUR (IV SOLUTION) ×2 IMPLANT
WRAP KNEE MAXI GEL POST OP (GAUZE/BANDAGES/DRESSINGS) ×2 IMPLANT
YANKAUER SUCT BULB TIP 10FT TU (MISCELLANEOUS) ×1 IMPLANT

## 2019-02-06 NOTE — Progress Notes (Signed)
Assisted Dr. Hatchett with right, ultrasound guided, adductor canal block. Side rails up, monitors on throughout procedure. See vital signs in flow sheet. Tolerated Procedure well.  

## 2019-02-06 NOTE — Anesthesia Procedure Notes (Signed)
Anesthesia Regional Block: Adductor canal block   Pre-Anesthetic Checklist: ,, timeout performed, Correct Patient, Correct Site, Correct Laterality, Correct Procedure, Correct Position, site marked, Risks and benefits discussed,  Surgical consent,  Pre-op evaluation,  At surgeon's request and post-op pain management  Laterality: Lower and Right  Prep: chloraprep       Needles:  Injection technique: Single-shot  Needle Type: Echogenic Stimulator Needle     Needle Length: 10cm  Needle Gauge: 21   Needle insertion depth: 3.5 cm   Additional Needles:   Procedures:,,,, ultrasound used (permanent image in chart),,,,  Narrative:  Start time: 02/06/2019 11:35 AM End time: 02/06/2019 11:45 AM Injection made incrementally with aspirations every 5 mL.  Performed by: Personally  Anesthesiologist: Lyn Hollingshead, MD

## 2019-02-06 NOTE — Anesthesia Preprocedure Evaluation (Addendum)
Anesthesia Evaluation  Patient identified by MRN, date of birth, ID band Patient awake    Reviewed: Allergy & Precautions, NPO status , Patient's Chart, lab work & pertinent test results  Airway Mallampati: II       Dental no notable dental hx. (+) Teeth Intact   Pulmonary former smoker,    Pulmonary exam normal breath sounds clear to auscultation       Cardiovascular hypertension, Pt. on medications +CHF  Normal cardiovascular exam Rhythm:Regular Rate:Normal     Neuro/Psych    GI/Hepatic negative GI ROS, Neg liver ROS,   Endo/Other  diabetes, Type 2Morbid obesity  Renal/GU negative Renal ROS  negative genitourinary   Musculoskeletal negative musculoskeletal ROS (+)   Abdominal (+) + obese,   Peds  Hematology negative hematology ROS (+)   Anesthesia Other Findings   Reproductive/Obstetrics                            Anesthesia Physical Anesthesia Plan  ASA: III  Anesthesia Plan: Spinal   Post-op Pain Management:  Regional for Post-op pain   Induction:   PONV Risk Score and Plan: 2 and Ondansetron and Dexamethasone  Airway Management Planned: Natural Airway, Nasal Cannula and Simple Face Mask  Additional Equipment: None  Intra-op Plan:   Post-operative Plan:   Informed Consent: I have reviewed the patients History and Physical, chart, labs and discussed the procedure including the risks, benefits and alternatives for the proposed anesthesia with the patient or authorized representative who has indicated his/her understanding and acceptance.       Plan Discussed with: CRNA  Anesthesia Plan Comments:         Anesthesia Quick Evaluation

## 2019-02-06 NOTE — Interval H&P Note (Signed)
History and Physical Interval Note:  02/06/2019 10:23 AM  Breanna Shields  has presented today for surgery, with the diagnosis of Failed right total knee.  The various methods of treatment have been discussed with the patient and family. After consideration of risks, benefits and other options for treatment, the patient has consented to  Procedure(s) with comments: TOTAL KNEE REVISION (Right) - 90 mins as a surgical intervention.  The patient's history has been reviewed, patient examined, no change in status, stable for surgery.  I have reviewed the patient's chart and labs.  Questions were answered to the patient's satisfaction.     Mauri Pole

## 2019-02-06 NOTE — Anesthesia Postprocedure Evaluation (Signed)
Anesthesia Post Note  Patient: Breanna Shields  Procedure(s) Performed: TOTAL KNEE REVISION (Right Knee)     Patient location during evaluation: PACU Anesthesia Type: Spinal Level of consciousness: sedated Pain management: pain level controlled Vital Signs Assessment: post-procedure vital signs reviewed and stable Respiratory status: spontaneous breathing Cardiovascular status: stable Postop Assessment: no headache, no backache, spinal receding, patient able to bend at knees and no apparent nausea or vomiting Anesthetic complications: no    Last Vitals:  Vitals:   02/06/19 1500 02/06/19 1515  BP: (!) 109/49 (!) 123/58  Pulse: 69 65  Resp: 19 15  Temp:    SpO2: 100% 100%    Last Pain:  Vitals:   02/06/19 1515  TempSrc:   PainSc: Asleep   Pain Goal: Patients Stated Pain Goal: 4 (02/06/19 0958)  LLE Motor Response: Purposeful movement (02/06/19 1456)   RLE Motor Response: Purposeful movement (02/06/19 1456)   L Sensory Level: L4-Anterior knee, lower leg (02/06/19 1456) R Sensory Level: L4-Anterior knee, lower leg (02/06/19 1456) Epidural/Spinal Function Patient able to flex knees: Yes (02/06/19 1456), Patient able to lift hips off bed: No (02/06/19 1456), Back pain beyond tenderness at insertion site: No (02/06/19 1456), Progressively worsening motor and/or sensory loss: No (02/06/19 1456), Bowel and/or bladder incontinence post epidural: No (02/06/19 1456)  Huston Foley

## 2019-02-06 NOTE — Transfer of Care (Signed)
Immediate Anesthesia Transfer of Care Note  Patient: DYLAN SORRENTINO  Procedure(s) Performed: TOTAL KNEE REVISION (Right Knee)  Patient Location: PACU  Anesthesia Type:Spinal  Level of Consciousness: awake, alert , oriented and patient cooperative  Airway & Oxygen Therapy: Patient Spontanous Breathing and Patient connected to face mask oxygen  Post-op Assessment: Report given to RN and Post -op Vital signs reviewed and stable  Post vital signs: Reviewed and stable  Last Vitals:  Vitals Value Taken Time  BP 113/61 02/06/19 1456  Temp 36.4 C 02/06/19 1456  Pulse 66 02/06/19 1459  Resp 17 02/06/19 1459  SpO2 100 % 02/06/19 1459  Vitals shown include unvalidated device data.  Last Pain:  Vitals:   02/06/19 1150  TempSrc:   PainSc: 0-No pain      Patients Stated Pain Goal: 4 ([id number])  Complications: No apparent anesthesia complications

## 2019-02-06 NOTE — Patient Outreach (Signed)
  [facility]) Care Management Chronic Special Needs Program    02/06/2019  Name: Breanna Shields, DOB: 1940/05/30  MRN: [id number]   [name] is enrolled in a chronic special needs plan for Diabetes. Client admitted to [facility] on 02/06/19 for rt revision total knee arthroplasty. Individualized care plan sent to [facility] for admission   [facility] continue to follow.  Peter Garter RN, Jackquline Denmark, CDE Chronic Care Management Coordinator [facility] Care Management [phone]

## 2019-02-06 NOTE — Brief Op Note (Signed)
02/06/2019  2:15PM  PATIENT:  Breanna Shields  78 y.o. female  PRE-OPERATIVE DIAGNOSIS:  Failed right total knee replacement, aseptic loosening  POST-OPERATIVE DIAGNOSIS:  Failed right total knee replacement, aseptic loosening  PROCEDURE:  Procedure(s) with comments: TOTAL KNEE REVISION (Right) - 90 mins  SURGEON:  Surgeon(s) and Role:    Paralee Cancel, MD - Primary  PHYSICIAN ASSISTANT: Danae Orleans, PA-C  ANESTHESIA:   regional and spinal  EBL:  <100cc   BLOOD ADMINISTERED:none  DRAINS: none   LOCAL MEDICATIONS USED:  MARCAINE     SPECIMEN:  No Specimen  DISPOSITION OF SPECIMEN:  N/A  COUNTS:  YES  TOURNIQUET:  48 min at 250 mmHg  DICTATION: .Other Dictation: Dictation Number [id number]  PLAN OF CARE: Admit to inpatient   PATIENT DISPOSITION:  PACU - hemodynamically stable.   Delay start of Pharmacological VTE agent (>24hrs) due to surgical blood loss or risk of bleeding: no

## 2019-02-06 NOTE — Anesthesia Procedure Notes (Signed)
Spinal  Patient location during procedure: OR Start time: 02/06/2019 12:32 PM End time: 02/06/2019 12:35 PM Staffing Performed: anesthesiologist  Anesthesiologist: Lyn Hollingshead, MD Preanesthetic Checklist Completed: patient identified, IV checked, site marked, risks and benefits discussed, surgical consent, monitors and equipment checked, pre-op evaluation and timeout performed Spinal Block Patient position: sitting Prep: DuraPrep and site prepped and draped Patient monitoring: continuous pulse ox and blood pressure Approach: midline Location: L3-4 Injection technique: single-shot Needle Needle type: Pencan  Needle gauge: 24 G Needle length: 10 cm Needle insertion depth: 6 cm Assessment Sensory level: T8

## 2019-02-07 LAB — GLUCOSE, CAPILLARY
Glucose-Capillary: 188 mg/dL — ABNORMAL HIGH (ref 70–99)
Glucose-Capillary: 233 mg/dL — ABNORMAL HIGH (ref 70–99)
Glucose-Capillary: 240 mg/dL — ABNORMAL HIGH (ref 70–99)
Glucose-Capillary: 251 mg/dL — ABNORMAL HIGH (ref 70–99)

## 2019-02-07 LAB — CBC
HCT: 32.6 % — ABNORMAL LOW (ref 36.0–46.0)
Hemoglobin: 10.2 g/dL — ABNORMAL LOW (ref 12.0–15.0)
MCH: 29.7 pg (ref 26.0–34.0)
MCHC: 31.3 g/dL (ref 30.0–36.0)
MCV: 95 fL (ref 80.0–100.0)
Platelets: 264 10*3/uL (ref 150–400)
RBC: 3.43 MIL/uL — ABNORMAL LOW (ref 3.87–5.11)
RDW: 13.5 % (ref 11.5–15.5)
WBC: 18.1 10*3/uL — ABNORMAL HIGH (ref 4.0–10.5)
nRBC: 0 % (ref 0.0–0.2)

## 2019-02-07 LAB — BASIC METABOLIC PANEL
Anion gap: 9 (ref 5–15)
BUN: 22 mg/dL (ref 8–23)
CO2: 25 mmol/L (ref 22–32)
Calcium: 8.4 mg/dL — ABNORMAL LOW (ref 8.9–10.3)
Chloride: 104 mmol/L (ref 98–111)
Creatinine, Ser: 0.87 mg/dL (ref 0.44–1.00)
GFR calc Af Amer: >60 mL/min (ref >60)
GFR calc non Af Amer: >60 mL/min (ref >60)
Glucose, Bld: 244 mg/dL — ABNORMAL HIGH (ref 70–99)
Potassium: 4.5 mmol/L (ref 3.5–5.1)
Sodium: 138 mmol/L (ref 135–145)

## 2019-02-07 MED ORDER — DOXYCYCLINE HYCLATE 100 MG PO TABS: 100.0000 mg | ORAL_TABLET | Freq: Two times a day (BID) | ORAL | 0 refills | Status: AC

## 2019-02-07 MED ORDER — KETOROLAC TROMETHAMINE 15 MG/ML IJ SOLN
7.5000 mg | Freq: Four times a day (QID) | INTRAMUSCULAR | Status: AC
  Administered 2019-02-07 (×2): 7.5 mg via INTRAVENOUS
  Filled 2019-02-07 (×2): qty 1

## 2019-02-07 MED ORDER — DOXYCYCLINE HYCLATE 100 MG PO TABS
100.0000 mg | ORAL_TABLET | Freq: Two times a day (BID) | ORAL | Status: DC
  Administered 2019-02-07 – 2019-02-08 (×3): 100 mg via ORAL
  Filled 2019-02-07 (×4): qty 1

## 2019-02-07 NOTE — Progress Notes (Signed)
Physical Therapy Treatment Patient Details Name: Breanna Shields MRN: [id number] DOB: 09/07/1940 Today's Date: 02/07/2019    History of Present Illness Pt s/p R TKR revision and with hx of bil TKR and DM    PT Comments    Pt continues motivated and progressing with mobility.  Pt performed HEP with assist - written instruction provided and reviewed.   Follow Up Recommendations  Follow surgeon's recommendation for DC plan and follow-up therapies     Equipment Recommendations  3in1 (PT)(Pt now states she needs 3n1 - borrowed last time)    Recommendations for Other Services       Precautions / Restrictions Precautions Precautions: Knee;Fall Restrictions Weight Bearing Restrictions: No Other Position/Activity Restrictions: WBAT    Mobility  Bed Mobility Overal bed mobility: Needs Assistance Bed Mobility: Supine to Sit;Sit to Supine     Supine to sit: Supervision Sit to supine: Supervision   General bed mobility comments: use of bed rail  Transfers Overall transfer level: Needs assistance Equipment used: Rolling walker (2 wheeled) Transfers: Sit to/from Stand Sit to Stand: Min guard         General transfer comment: cues for LE management and use of UEs to self assist  Ambulation/Gait Ambulation/Gait assistance: Min guard Gait Distance (Feet): 120 Feet Assistive device: Rolling walker (2 wheeled) Gait Pattern/deviations: Step-to pattern;Step-through pattern;Decreased step length - right;Decreased step length - left;Shuffle;Wide base of support;Antalgic;Trunk flexed     General Gait Details: cues for posture, position from RW and sequence   Stairs             Wheelchair Mobility    Modified Rankin (Stroke Patients Only)       Balance Overall balance assessment: Mild deficits observed, not formally tested                                          Cognition Arousal/Alertness: Awake/alert Behavior During Therapy: WFL for tasks  assessed/performed Overall Cognitive Status: Within Functional Limits for tasks assessed                                        Exercises Total Joint Exercises Ankle Circles/Pumps: AROM;Both;15 reps;Supine Quad Sets: AROM;Both;10 reps;Supine Heel Slides: AAROM;Right;15 reps;Supine Hip ABduction/ADduction: AAROM;Right;10 reps;Supine Straight Leg Raises: AAROM;Right;10 reps;Supine Long Arc Quad: AROM;Right;5 reps;Seated Knee Flexion: AROM;AAROM;Right;5 reps;Seated    General Comments        Pertinent Vitals/Pain Pain Assessment: 0-10 Pain Score: 6  Pain Location: R knee Pain Descriptors / Indicators: Aching;Sore Pain Intervention(s): Limited activity within patient's tolerance;Monitored during session;Premedicated before session;Ice applied    Home Living                      Prior Function            PT Goals (current goals can now be found in the care plan section) Acute Rehab PT Goals Patient Stated Goal: Regain IND PT Goal Formulation: With patient Time For Goal Achievement: 02/14/19 Potential to Achieve Goals: Good Progress towards PT goals: Progressing toward goals    Frequency    7X/week      PT Plan Current plan remains appropriate    Co-evaluation              AM-PAC PT "6 Clicks" Mobility  Outcome Measure  Help needed turning from your back to your side while in a flat bed without using bedrails?: A Little Help needed moving from lying on your back to sitting on the side of a flat bed without using bedrails?: A Little Help needed moving to and from a bed to a chair (including a wheelchair)?: A Little Help needed standing up from a chair using your arms (e.g., wheelchair or bedside chair)?: A Little Help needed to walk in hospital room?: A Little Help needed climbing 3-5 steps with a railing? : A Little 6 Click Score: 18    End of Session Equipment Utilized During Treatment: Gait belt Activity Tolerance: Patient  tolerated treatment well Patient left: in chair;with call bell/phone within reach;with chair alarm set Nurse Communication: Mobility status PT Visit Diagnosis: Difficulty in walking, not elsewhere classified (R26.2)     Time: 1613-1700 PT Time Calculation (min) (ACUTE ONLY): 47 min  Charges:  $Gait Training: 8-22 mins $Therapeutic Exercise: 23-37 mins                     [place] Pager [phone] Office [phone]    Roby Spalla 02/07/2019, 5:05 PM

## 2019-02-07 NOTE — Op Note (Signed)
NAME: Breanna Shields, Breanna Shields ACCOUNT [id number] DATE OF BIRTH:02-Jul-1940 FACILITY: [facility] LOCATION: [facility] PHYSICIAN:Kamera Dubas D. 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  PROCEDURE:  Revision of right total knee replacement.  COMPONENTS USED:  DePuy Attune revision knee system with a right size 4 revision femur, 14 x 60 press-fit stem with 4 mm posterior augments medially and laterally.  The tibia side was a size 3 Attune revision tray with a 29 fully porous coated sleeve and  a 14 x 60 press-fit stem and a size 8 mm posterior stabilized insert.  SURGEON:  Paralee Cancel, MD  ASSISTANT:  Danae Orleans, PA-C  Note that Mr. Guinevere Scarlet was present for the entirety of the case from preoperative positioning, perioperative management of the operative extremity, general facilitation of case, and primary wound closure.  ANESTHESIA:  Regional plus spinal.  SPECIMENS:  None.  COMPLICATIONS:  None.  TOURNIQUET:  Up for 48 minutes at 250 mmHg.  ESTIMATED BLOOD LOSS:  Less than 100 mL.  INDICATIONS:  The patient is a pleasant 79 year old female with history of right total knee replacement.  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  She had persistent pain in his right knee, and subsequent workup including a bone scan revealed  increased uptake around the medial aspect of the tibial tray.  She was noting a significant effect on this knee on her quality of life and functional ability.  She ultimately decided to proceed with revision knee surgery.  Risks of infection, DVT,  component failure, need for future surgeries were all discussed and reviewed.  The postoperative course was reviewed.  She has undergone this primary right total knee  replacement as well as a left knee replacement, so she is aware of the effort required  to maximize her functional return.  Consent was obtained for benefit of pain relief.  PROCEDURE IN DETAIL: The patient was brought to the operative theater.  Once adequate anesthesia, preoperative antibiotics, and Ancef administered as well as tranexamic acid and Decadron, she was positioned supine with a right thigh tourniquet placed.  The right lower  extremity was then prepped and draped in sterile fashion.  Timeout was performed identifying the patient, planned procedure and extremity.  Leg was exsanguinated, tourniquet elevated to 250 mmHg.  Her old incision was marked and excised.  Soft tissue  debridement was carried out followed by a medial parapatellar arthrotomy.  We encountered clear synovial fluid as well as findings were significant synovitis within the knee.  The initial portion of the procedure was exposure including medial and lateral  synovectomies as well as suprapatellar pouch.  I debrided around the patella.  I ultimately decided to relieve her patellar button in place as it was intact and stable and felt that the remaining bone stock may be too thin to be revised.  The patella  subluxated laterally and the knee flexed.  I used a thin ACL saw and undermined the tibial plateau surface noting that it did loosen early.  We then did this on the femoral component.  The femoral component was removed without bone loss.  The tibial  component was removed without bone loss.  I then removed all remaining cement on the distal femur and proximal tibia.  We then reamed the canals  of the femur and the tibia.  I was only able to get to 14 mm with good fit, and I was able to get further.   This limited me to press-fit stems as opposed to cemented stems.  Attention was first directed to tibia.  Using extramedullary guide, we revisited the proximal tibial cut.  The cut surface seemed to be best fit with a size 3  tibial tray.  We prepared this for a size 29 sleeve.  I placed a trial component in place with  a 3 tray, a 29 sleeve, and a stem.  This was now perpendicular in the coronal plane.  Attention was then directed to the femur.  The femur seemed best sized for a size 4 femur.  Per protocol, revisited the distal cut as well as the anterior, posterior,  and chamfer cuts.  We identified that the 4 mm augments would be beneficial on the posterior aspect medially and laterally for the femur.  A trial reduction was now carried out with the 4 femur and placed the tibial tray and identified that with these  components placed, a 10 mm insert was stable and balanced from flexion into extension and the patella tracked through the trochlea without application of pressure.  Given these findings, we removed the trial components.  We irrigated the canals with a  canal brush irrigator and then the knee with pulse lavage.  We injected the synovial capsular junction of the knee with 0.25% Marcaine without epinephrine and 1 mL of Toradol and saline.  The final components were opened and configured on the back table  under direct supervision with my PA and the surgical technician.  Once these were ready, 1 batch of cement was mixed for the femoral component.  The tibial component was impacted.  It was noted that the impaction did not seat all the way fully where the  broach was.  This was then taken into consideration for the size of the insert.  I did not want to further impact Colles complication fracture.  The femoral component then had cement placed on the distal femur as well as around the femoral component.   This component was then impacted.  The knee was then brought to extension with the 8 mm insert.  The knee came to full extension without contracture.  The knee was held in extension until the cement fully cured.  Once the cement fully cured, excessive  cement was removed throughout the knee.  Based on the trial reduction  with 8 mm insert, we selected the 8 mm posterior stabilized rotating platform insert.  It was placed into the tibial tray.  The knee was then reduced.  We reirrigated the knee.  The  tourniquet had been let down after 48 minutes without significant hemostasis required.  The extensor mechanism was then reapproximated using #1 Vicryl and #1 Stratafix suture.  The remaining wound was closed with 2-0 Vicryl and a running Monocryl stitch.   The knee was cleaned, dried, and dressed sterilely using surgical glue and an Aquacel dressing.  The knee was wrapped in Ace wrap.  She was then brought to the recovery room in stable condition, tolerating the procedure well.  Findings were reviewed  with her husband.  She will be admitted to the hospital overnight, and pending her effort with physical therapy, will likely be discharged tomorrow.  I will have her be partial weightbearing due to the press-fit tibial component to allow for bony  ingrowth.  Range of  motion will be permittable.  LN/NUANCE  D:02/07/2019 T:02/07/2019 JOB:[id number]

## 2019-02-07 NOTE — Discharge Summary (Addendum)
Orthopedic Discharge Summary        Physician Discharge Summary  Patient ID: Breanna Shields MRN: [id number] DOB/AGE: 06-09-1940 79 y.o.  Admit date: 02/06/2019 Discharge date: 02/07/2019   Procedures:  Procedure(s) (LRB): TOTAL KNEE REVISION (Right)  Attending Physician:  Dr. Alvan Dame  Admission Diagnoses:   Right failed total knee  Discharge Diagnoses:  Right failed total knee   Past Medical History:  Diagnosis Date  . Allergy   . Diabetes mellitus    type ii  . Hyperlipidemia   . Hypertension     PCP: Lorne Skeens, MD   Discharged Condition: good  Hospital Course:  Patient underwent the above stated procedure on 02/06/2019. Patient tolerated the procedure well and brought to the recovery room in good condition and subsequently to the floor. Patient had an uncomplicated hospital course and was stable for discharge.  Discharge was delayed due to N/V which resolved with supportive care.   Disposition: Discharge disposition: 01-Home or Self Care      with follow up in 2 weeks   Follow-up Information    Paralee Cancel, MD. Schedule an appointment as soon as possible for a visit in 2 weeks.   Specialty: Orthopedic Surgery Contact information: [address] [place] [address] [id number]           Discharge Instructions    Call MD / Call 911   Complete by: As directed    If you experience chest pain or shortness of breath, CALL 911 and be transported to the hospital emergency room.  If you develope a fever above 101 F, pus (white drainage) or increased drainage or redness at the wound, or calf pain, call your surgeon's office.   Call MD / Call 911   Complete by: As directed    If you experience chest pain or shortness of breath, CALL 911 and be transported to the hospital emergency room.  If you develope a fever above 101 F, pus (white drainage) or increased drainage or redness at the wound, or calf pain, call your surgeon's office.    Constipation Prevention   Complete by: As directed    Drink plenty of fluids.  Prune juice may be helpful.  You may use a stool softener, such as Colace (over the counter) 100 mg twice a day.  Use MiraLax (over the counter) for constipation as needed.   Constipation Prevention   Complete by: As directed    Drink plenty of fluids.  Prune juice may be helpful.  You may use a stool softener, such as Colace (over the counter) 100 mg twice a day.  Use MiraLax (over the counter) for constipation as needed.   Diet - low sodium heart healthy   Complete by: As directed    Diet - low sodium heart healthy   Complete by: As directed    Increase activity slowly as tolerated   Complete by: As directed    Increase activity slowly as tolerated   Complete by: As directed       Allergies as of 02/07/2019      Reactions   Chantix [varenicline Tartrate] Nausea Only, Other (See Comments)   Dizziness   Oxytrol [oxybutynin Base] Other (See Comments)   Dry mouth      Medication List    STOP taking these medications   aspirin EC 81 MG tablet   ibuprofen 200 MG tablet Commonly known as: ADVIL   SLEEP AID PO     TAKE these medications  atorvastatin 80 MG tablet Commonly known as: LIPITOR Take 40 mg by mouth every evening.   B-complex with vitamin C tablet Take 1 tablet by mouth daily.   doxycycline 100 MG tablet Commonly known as: VIBRA-TABS Take 1 tablet (100 mg total) by mouth every 12 (twelve) hours for 14 days.   multivitamin with minerals tablet Take 1 tablet by mouth daily.   olmesartan 40 MG tablet Commonly known as: BENICAR Take 40 mg by mouth daily.   ONE TOUCH ULTRA TEST test strip Generic drug: glucose blood   pioglitazone 30 MG tablet Commonly known as: ACTOS Take 30 mg by mouth Daily.   Trulicity [id number] Sopn Generic drug: Dulaglutide Inject 0.75 mg into the skin every Tuesday at 6 PM.   Vitamin D3 50 MCG (2000 UT) Tabs Take 2,000 Units by mouth daily.           Signed: Ventura Bruns 02/07/2019, 8:07 AM  [facility] is now [facility] [address], [place], [place], [place] [address] Phone: [place]

## 2019-02-07 NOTE — Progress Notes (Signed)
Patient ID: Breanna Shields, female   DOB: August 19, 1940, 79 y.o.   MRN: [id number] Subjective: 1 Day Post-Op Procedure(s) (LRB): TOTAL KNEE REVISION (Right)    Patient reports pain as moderate.  Able to get some sleep last night. Does complain of headaches since surgery, ?etioogy - pain meds vs spinal anesthesia  Objective:   VITALS:   Vitals:   02/07/19 0108 02/07/19 0522  BP: 132/61 (!) 147/64  Pulse: 76 76  Resp: 20 16  Temp: (!) 97.4 F (36.3 C) (!) 97.4 F (36.3 C)  SpO2: 95% 96%    Neurovascular intact - right LE Incision: dressing C/D/I  LABS Recent Labs    02/07/19 0410  HGB 10.2*  HCT 32.6*  WBC 18.1*  PLT 264    No results for input(s): NA, K, BUN, CREATININE, GLUCOSE in the last 72 hours.  No results for input(s): LABPT, INR in the last 72 hours.   Assessment/Plan: 1 Day Post-Op Procedure(s) (LRB): TOTAL KNEE REVISION (Right)   Advance diet Up with therapy   If does well with PT and headaches resolve then plan for discharge to home later this afternoon Try some toradol to help with the pain and headaches She feels that hydrocodone works for her RTC in 2 weeks Goals reviewed

## 2019-02-07 NOTE — Evaluation (Signed)
Physical Therapy Evaluation Patient Details Name: Breanna Shields MRN: [id number] DOB: 1941/01/01 Today's Date: 02/07/2019   History of Present Illness  Pt s/p R TKR revision and with hx of bil TKR and DM  Clinical Impression  Pt s/p R TKR revision and presents with decreased R LE strength/ROM and post op pain limiting functional mobility.  Pt should progress to dc home with family assist.      Follow Up Recommendations Follow surgeon's recommendation for DC plan and follow-up therapies    Equipment Recommendations  None recommended by PT    Recommendations for Other Services       Precautions / Restrictions Precautions Precautions: Knee;Fall Restrictions Weight Bearing Restrictions: No Other Position/Activity Restrictions: WBAT      Mobility  Bed Mobility Overal bed mobility: Needs Assistance Bed Mobility: Supine to Sit     Supine to sit: Min guard     General bed mobility comments: use of bed rail, min guard for R LE  Transfers Overall transfer level: Needs assistance Equipment used: Rolling walker (2 wheeled) Transfers: Sit to/from Stand Sit to Stand: Min assist;Min guard         General transfer comment: cues for LE management and use of UEs to self assist  Ambulation/Gait Ambulation/Gait assistance: Min assist;Min guard Gait Distance (Feet): 120 Feet Assistive device: Rolling walker (2 wheeled) Gait Pattern/deviations: Step-to pattern;Step-through pattern;Decreased step length - right;Decreased step length - left;Shuffle;Wide base of support;Antalgic;Trunk flexed     General Gait Details: cues for posture, position from RW and sequence  Stairs            Wheelchair Mobility    Modified Rankin (Stroke Patients Only)       Balance Overall balance assessment: Mild deficits observed, not formally tested                                           Pertinent Vitals/Pain Pain Assessment: 0-10 Pain Score: 6  Pain Location: R  knee Pain Descriptors / Indicators: Aching;Sore Pain Intervention(s): Limited activity within patient's tolerance;Monitored during session;Premedicated before session;Ice applied    Home Living Family/patient expects to be discharged to:: Private residence Living Arrangements: Spouse/significant other Available Help at Discharge: Family Type of Home: House Home Access: [place]: [address]: Environmental consultant - 2 wheels;Bedside commode      Prior Function Level of Independence: Independent               Hand Dominance        Extremity/Trunk Assessment   Upper Extremity Assessment Upper Extremity Assessment: Overall WFL for tasks assessed    Lower Extremity Assessment Lower Extremity Assessment: RLE deficits/detail RLE Deficits / Details: 3-/5 quads with AAROM at R knee -8 - 80    Cervical / Trunk Assessment Cervical / Trunk Assessment: Normal  Communication   Communication: HOH  Cognition Arousal/Alertness: Awake/alert Behavior During Therapy: WFL for tasks assessed/performed Overall Cognitive Status: Within Functional Limits for tasks assessed                                        General Comments      Exercises Total Joint Exercises Ankle Circles/Pumps: AROM;Both;15 reps;Supine Quad Sets: AROM;Both;10 reps;Supine Heel Slides: AAROM;Right;15 reps;Supine Straight Leg Raises: AAROM;Right;10 reps;Supine  Assessment/Plan    PT Assessment Patient needs continued PT services  PT Problem List Decreased strength;Decreased range of motion;Decreased activity tolerance;Decreased mobility;Decreased knowledge of use of DME;Obesity;Pain       PT Treatment Interventions DME instruction;Gait training;Stair training;Functional mobility training;Therapeutic activities;Therapeutic exercise;Patient/family education    PT Goals (Current goals can be found in the Care Plan section)  Acute Rehab PT Goals Patient Stated Goal:  Regain IND PT Goal Formulation: With patient Time For Goal Achievement: 02/14/19 Potential to Achieve Goals: Good    Frequency 7X/week   Barriers to discharge        Co-evaluation               AM-PAC PT "6 Clicks" Mobility  Outcome Measure Help needed turning from your back to your side while in a flat bed without using bedrails?: A Little Help needed moving from lying on your back to sitting on the side of a flat bed without using bedrails?: A Little Help needed moving to and from a bed to a chair (including a wheelchair)?: A Little Help needed standing up from a chair using your arms (e.g., wheelchair or bedside chair)?: A Little Help needed to walk in hospital room?: A Little Help needed climbing 3-5 steps with a railing? : A Little 6 Click Score: 18    End of Session Equipment Utilized During Treatment: Gait belt Activity Tolerance: Patient tolerated treatment well Patient left: in chair;with call bell/phone within reach;with chair alarm set Nurse Communication: Mobility status PT Visit Diagnosis: Difficulty in walking, not elsewhere classified (R26.2)    Time: [id number] PT Time Calculation (min) (ACUTE ONLY): 31 min   Charges:   PT Evaluation $PT Eval Low Complexity: 1 Low PT Treatments $Therapeutic Exercise: 8-22 mins        Debe Coder PT Acute Rehabilitation Services Pager [phone] Office [phone]   [name] 02/07/2019, 12:43 PM

## 2019-02-07 NOTE — Discharge Instructions (Signed)
INSTRUCTIONS AFTER JOINT REPLACEMENT   o Remove items at home which could result in a fall. This includes throw rugs or furniture in walking pathways o ICE to the affected joint every three hours while awake for 30 minutes at a time, for at least the first 3-5 days, and then as needed for pain and swelling.  Continue to use ice for pain and swelling. You may notice swelling that will progress down to the foot and ankle.  This is normal after surgery.  Elevate your leg when you are not up walking on it.   o Continue to use the breathing machine you got in the hospital (incentive spirometer) which will help keep your temperature down.  It is common for your temperature to cycle up and down following surgery, especially at night when you are not up moving around and exerting yourself.  The breathing machine keeps your lungs expanded and your temperature down.   DIET:  As you were doing prior to hospitalization, we recommend a well-balanced diet.  DRESSING / WOUND CARE / SHOWERING  Keep the surgical dressing until follow up.  The dressing is water proof, so you can shower without any extra covering.  IF THE DRESSING FALLS OFF or the wound gets wet inside, change the dressing with sterile gauze.  Please use good hand washing techniques before changing the dressing.  Do not use any lotions or creams on the incision until instructed by your surgeon.    ACTIVITY  o Increase activity slowly as tolerated, but follow the weight bearing instructions below.   o No driving for 6 weeks or until further direction given by your physician.  You cannot drive while taking narcotics.  o No lifting or carrying greater than 10 lbs. until further directed by your surgeon. o Avoid periods of inactivity such as sitting longer than an hour when not asleep. This helps prevent blood clots.  o You may return to work once you are authorized by your doctor.     WEIGHT BEARING   Partial weight bearing with assist device as  directed.  50% RLE   EXERCISES  Results after joint replacement surgery are often greatly improved when you follow the exercise, range of motion and muscle strengthening exercises prescribed by your doctor. Safety measures are also important to protect the joint from further injury. Any time any of these exercises cause you to have increased pain or swelling, decrease what you are doing until you are comfortable again and then slowly increase them. If you have problems or questions, call your caregiver or physical therapist for advice.   Rehabilitation is important following a joint replacement. After just a few days of immobilization, the muscles of the leg can become weakened and shrink (atrophy).  These exercises are designed to build up the tone and strength of the thigh and leg muscles and to improve motion. Often times heat used for twenty to thirty minutes before working out will loosen up your tissues and help with improving the range of motion but do not use heat for the first two weeks following surgery (sometimes heat can increase post-operative swelling).   These exercises can be done on a training (exercise) mat, on the floor, on a table or on a bed. Use whatever works the best and is most comfortable for you.    Use music or television while you are exercising so that the exercises are a pleasant break in your day. This will make your life better with the  exercises acting as a break in your routine that you can look forward to.   Perform all exercises about fifteen times, three times per day or as directed.  You should exercise both the operative leg and the other leg as well.  Exercises include:    Quad Sets - Tighten up the muscle on the front of the thigh (Quad) and hold for 5-10 seconds.    Straight Leg Raises - With your knee straight (if you were given a brace, keep it on), lift the leg to 60 degrees, hold for 3 seconds, and slowly lower the leg.  Perform this exercise against  resistance later as your leg gets stronger.   Leg Slides: Lying on your back, slowly slide your foot toward your buttocks, bending your knee up off the floor (only go as far as is comfortable). Then slowly slide your foot back down until your leg is flat on the floor again.   Angel Wings: Lying on your back spread your legs to the side as far apart as you can without causing discomfort.   Hamstring Strength:  Lying on your back, push your heel against the floor with your leg straight by tightening up the muscles of your buttocks.  Repeat, but this time bend your knee to a comfortable angle, and push your heel against the floor.  You may put a pillow under the heel to make it more comfortable if necessary.   A rehabilitation program following joint replacement surgery can speed recovery and prevent re-injury in the future due to weakened muscles. Contact your doctor or a physical therapist for more information on knee rehabilitation.    CONSTIPATION  Constipation is defined medically as fewer than three stools per week and severe constipation as less than one stool per week.  Even if you have a regular bowel pattern at home, your normal regimen is likely to be disrupted due to multiple reasons following surgery.  Combination of anesthesia, postoperative narcotics, change in appetite and fluid intake all can affect your bowels.   YOU MUST use at least one of the following options; they are listed in order of increasing strength to get the job done.  They are all available over the counter, and you may need to use some, POSSIBLY even all of these options:    Drink plenty of fluids (prune juice may be helpful) and high fiber foods Colace 100 mg by mouth twice a day  Senokot for constipation as directed and as needed Dulcolax (bisacodyl), take with full glass of water  Miralax (polyethylene glycol) once or twice a day as needed.  If you have tried all these things and are unable to have a bowel  movement in the first 3-4 days after surgery call either your surgeon or your primary doctor.    If you experience loose stools or diarrhea, hold the medications until you stool forms back up.  If your symptoms do not get better within 1 week or if they get worse, check with your doctor.  If you experience "the worst abdominal pain ever" or develop nausea or vomiting, please contact the office immediately for further recommendations for treatment.   ITCHING:  If you experience itching with your medications, try taking only a single pain pill, or even half a pain pill at a time.  You can also use Benadryl over the counter for itching or also to help with sleep.   TED HOSE STOCKINGS:  Use stockings on both legs until for at  least 2 weeks or as directed by physician office. They may be removed at night for sleeping.  MEDICATIONS:  See your medication summary on the After Visit Summary that nursing will review with you.  You may have some home medications which will be placed on hold until you complete the course of blood thinner medication.  It is important for you to complete the blood thinner medication as prescribed.  PRECAUTIONS:  If you experience chest pain or shortness of breath - call 911 immediately for transfer to the hospital emergency department.   If you develop a fever greater that 101 F, purulent drainage from wound, increased redness or drainage from wound, foul odor from the wound/dressing, or calf pain - CONTACT YOUR SURGEON.                                                   FOLLOW-UP APPOINTMENTS:  If you do not already have a post-op appointment, please call the office for an appointment to be seen by your surgeon.  Guidelines for how soon to be seen are listed in your After Visit Summary, but are typically between 1-4 weeks after surgery.  OTHER INSTRUCTIONS:   Knee Replacement:  Do not place pillow under knee, focus on keeping the knee straight while resting. CPM  instructions: 0-90 degrees, 2 hours in the morning, 2 hours in the afternoon, and 2 hours in the evening. Place foam block, curve side up under heel at all times except when in CPM or when walking.  DO NOT modify, tear, cut, or change the foam block in any way.  MAKE SURE YOU:   Understand these instructions.   Get help right away if you are not doing well or get worse.    Thank you for letting us be a part of your medical care team.  It is a privilege we respect greatly.  We hope these instructions will help you stay on track for a fast and full recovery!

## 2019-02-07 NOTE — Progress Notes (Signed)
   Subjective: 1 Day Post-Op Procedure(s) (LRB): TOTAL KNEE REVISION (Right)  Pt doing well Denies any new symptoms or issues Therapy going well Patient reports pain as mild.  Objective:   VITALS:   Vitals:   02/07/19 0108 02/07/19 0522  BP: 132/61 (!) 147/64  Pulse: 76 76  Resp: 20 16  Temp: (!) 97.4 F (36.3 C) (!) 97.4 F (36.3 C)  SpO2: 95% 96%    Right knee dressing intact No drainage nv intact distally No rashes or edema distally  LABS Recent Labs    02/07/19 0410  HGB 10.2*  HCT 32.6*  WBC 18.1*  PLT 264    No results for input(s): NA, K, BUN, CREATININE, GLUCOSE in the last 72 hours.   Assessment/Plan: 1 Day Post-Op Procedure(s) (LRB): TOTAL KNEE REVISION (Right) PT today Plan on d/c later today Weight bearing as tolerated F/u in the office in 2 weeks    [place] PA-C, [place] is now [employer] [facility] [address] [place], [place], [place] [address] Phone: [phone] [email or web address] Facebook  [employer]

## 2019-02-08 LAB — CBC
HCT: 30.9 % — ABNORMAL LOW (ref 36.0–46.0)
Hemoglobin: 9.7 g/dL — ABNORMAL LOW (ref 12.0–15.0)
MCH: 29.4 pg (ref 26.0–34.0)
MCHC: 31.4 g/dL (ref 30.0–36.0)
MCV: 93.6 fL (ref 80.0–100.0)
Platelets: 262 10*3/uL (ref 150–400)
RBC: 3.3 MIL/uL — ABNORMAL LOW (ref 3.87–5.11)
RDW: 13.7 % (ref 11.5–15.5)
WBC: 15.8 10*3/uL — ABNORMAL HIGH (ref 4.0–10.5)
nRBC: 0 % (ref 0.0–0.2)

## 2019-02-08 LAB — BASIC METABOLIC PANEL
Anion gap: 7 (ref 5–15)
BUN: 25 mg/dL — ABNORMAL HIGH (ref 8–23)
CO2: 26 mmol/L (ref 22–32)
Calcium: 8.5 mg/dL — ABNORMAL LOW (ref 8.9–10.3)
Chloride: 104 mmol/L (ref 98–111)
Creatinine, Ser: 0.66 mg/dL (ref 0.44–1.00)
GFR calc Af Amer: >60 mL/min (ref >60)
GFR calc non Af Amer: >60 mL/min (ref >60)
Glucose, Bld: 148 mg/dL — ABNORMAL HIGH (ref 70–99)
Potassium: 4 mmol/L (ref 3.5–5.1)
Sodium: 137 mmol/L (ref 135–145)

## 2019-02-08 LAB — GLUCOSE, CAPILLARY
Glucose-Capillary: 116 mg/dL — ABNORMAL HIGH (ref 70–99)
Glucose-Capillary: 194 mg/dL — ABNORMAL HIGH (ref 70–99)

## 2019-02-08 MED ORDER — HYDROCODONE-ACETAMINOPHEN 5-325 MG PO TABS: 1.0000 | ORAL_TABLET | ORAL | 0 refills | Status: DC | PRN

## 2019-02-08 NOTE — TOC Progression Note (Signed)
Transition of Care [facility]) - Progression Note    Patient Details  Name: Breanna Shields MRN: [id number] Date of Birth: 05-19-1940  [facility]) CM/SW Contact  Joaquin Courts, RN Phone Number: 02/08/2019, 11:40 AM  Clinical Narrative:    CM spoke with patient, patient [facility] up with [facility] for [place]. [employer] to deliver 3-in-1. Patient states she does not want to wait for equipment delivery and requests to have it shipped to the home, CM explained shipping will take at least 3 days for equipment to arrive and patient is agreeable to this and wishes to proceed with shipping.    Expected Discharge Plan: [place] Barriers to Discharge: No Barriers Identified  Expected Discharge Plan and Services Expected Discharge Plan: [place]   Discharge Planning Services: CM Consult Post Acute Care Choice: [place] arrangements for the past 2 months: Single Family Home Expected Discharge Date: 02/08/19               DME Arranged: 3-N-1 DME Agency: [employer] Date DME Agency Contacted: 02/08/19 Time DME Agency Contacted: [phone] Representative spoke with at DME Agency: [place]: PT [place]: [facility] (formerly [facility])     Representative spoke with at [place]: pre arranged in MD office   Social Determinants of Health ([place]) Interventions    Readmission Risk Interventions No flowsheet data found.

## 2019-02-08 NOTE — Progress Notes (Signed)
Physical Therapy Treatment Patient Details Name: Breanna Shields MRN: [id number] DOB: December 02, 1940 Today's Date: 02/08/2019    History of Present Illness Pt s/p R TKR revision and with hx of bil TKR and DM    PT Comments    Pt cooperative but self-limiting 2* elevated pain level.  Pt willing to ambulate in hall short distance but therex program deferred.  Pt provided with written HEP yesterday for home.   Follow Up Recommendations  Follow surgeon's recommendation for DC plan and follow-up therapies     Equipment Recommendations  3in1 (PT)    Recommendations for Other Services       Precautions / Restrictions Precautions Precautions: Knee;Fall Restrictions Weight Bearing Restrictions: No Other Position/Activity Restrictions: WBAT    Mobility  Bed Mobility Overal bed mobility: Needs Assistance Bed Mobility: Supine to Sit;Sit to Supine     Supine to sit: Min assist Sit to supine: Min assist   General bed mobility comments: min assist to manage R LE  Transfers Overall transfer level: Needs assistance Equipment used: Rolling walker (2 wheeled) Transfers: Sit to/from Stand Sit to Stand: Min guard         General transfer comment: cues for LE management and use of UEs to self assist  Ambulation/Gait Ambulation/Gait assistance: Min guard Gait Distance (Feet): 90 Feet Assistive device: Rolling walker (2 wheeled) Gait Pattern/deviations: Step-to pattern;Step-through pattern;Decreased step length - right;Decreased step length - left;Shuffle;Wide base of support;Antalgic;Trunk flexed Gait velocity: decr   General Gait Details: cues for posture, position from RW and sequence   Stairs             Wheelchair Mobility    Modified Rankin (Stroke Patients Only)       Balance Overall balance assessment: Mild deficits observed, not formally tested                                          Cognition Arousal/Alertness: Awake/alert Behavior  During Therapy: WFL for tasks assessed/performed Overall Cognitive Status: Within Functional Limits for tasks assessed                                        Exercises      General Comments        Pertinent Vitals/Pain Pain Assessment: 0-10 Pain Score: 8  Pain Location: R knee Pain Descriptors / Indicators: Aching;Sore Pain Intervention(s): Limited activity within patient's tolerance;Monitored during session;Premedicated before session(pt declines ice)    Home Living                      Prior Function            PT Goals (current goals can now be found in the care plan section) Acute Rehab PT Goals Patient Stated Goal: Regain IND PT Goal Formulation: With patient Time For Goal Achievement: 02/14/19 Potential to Achieve Goals: Good Progress towards PT goals: Progressing toward goals    Frequency    7X/week      PT Plan Current plan remains appropriate    Co-evaluation              AM-PAC PT "6 Clicks" Mobility   Outcome Measure  Help needed turning from your back to your side while in a flat bed without using bedrails?: A Little  Help needed moving from lying on your back to sitting on the side of a flat bed without using bedrails?: A Little Help needed moving to and from a bed to a chair (including a wheelchair)?: A Little Help needed standing up from a chair using your arms (e.g., wheelchair or bedside chair)?: A Little Help needed to walk in hospital room?: A Little Help needed climbing 3-5 steps with a railing? : A Little 6 Click Score: 18    End of Session Equipment Utilized During Treatment: Gait belt Activity Tolerance: Patient tolerated treatment well Patient left: in bed;with call bell/phone within reach Nurse Communication: Mobility status PT Visit Diagnosis: Difficulty in walking, not elsewhere classified (R26.2)     Time: [id number] PT Time Calculation (min) (ACUTE ONLY): 21 min  Charges:  $Gait Training:  8-22 mins                     [place] Pager [phone] Office [phone]     Ambre Kobayashi 02/08/2019, 12:37 PM

## 2019-02-08 NOTE — Progress Notes (Signed)
    Subjective:  Patient reports pain as mild to moderate.  Discharge cancelled yesterday due to N/V - resolved today. Denies N/V/CP/SOB.   Objective:   VITALS:   Vitals:   02/07/19 0522 02/07/19 1338 02/07/19 2131 02/08/19 0516  BP: (!) 147/64 (!) 149/71 (!) 166/64 (!) 149/55  Pulse: 76 83 80 75  Resp: 16 17 17 18   Temp: (!) 97.4 F (36.3 C) 98.6 F (37 C) 97.8 F (36.6 C) 98.1 F (36.7 C)  TempSrc: Oral  Oral Oral  SpO2: 96% 97% 97% 96%  Weight:      Height:        NAD ABD soft Sensation intact distally Intact pulses distally Dorsiflexion/Plantar flexion intact Incision: dressing C/D/I Compartment soft   Lab Results  Component Value Date   WBC 15.8 (H) 02/08/2019   HGB 9.7 (L) 02/08/2019   HCT 30.9 (L) 02/08/2019   MCV 93.6 02/08/2019   PLT 262 02/08/2019   BMET    Component Value Date/Time   NA 137 02/08/2019 0403   K 4.0 02/08/2019 0403   CL 104 02/08/2019 0403   CO2 26 02/08/2019 0403   GLUCOSE 148 (H) 02/08/2019 0403   BUN 25 (H) 02/08/2019 0403   CREATININE 0.66 02/08/2019 0403   CALCIUM 8.5 (L) 02/08/2019 0403   GFRNONAA >60 02/08/2019 0403   GFRAA >60 02/08/2019 0403     Assessment/Plan: 2 Days Post-Op   Active Problems:   S/P revision of total knee, right   WBAT with walker DVT ppx: Aspirin, SCDs, TEDS PO pain control PT/OT Dispo: D/C home   [place] Marcille Barman 02/08/2019, 10:45 AM   Rod Can, MD [phone] [place] is now [facility]  [place] [facility] [address], [place], [place] [address] Phone: [phone] [email or web address] Facebook  [employer]

## 2019-02-10 ENCOUNTER — Other Ambulatory Visit: Payer: Self-pay

## 2019-02-10 DIAGNOSIS — E119 Type 2 diabetes mellitus without complications: Secondary | ICD-10-CM | POA: Diagnosis not present

## 2019-02-10 DIAGNOSIS — I10 Essential (primary) hypertension: Secondary | ICD-10-CM | POA: Diagnosis not present

## 2019-02-10 DIAGNOSIS — E785 Hyperlipidemia, unspecified: Secondary | ICD-10-CM | POA: Diagnosis not present

## 2019-02-10 DIAGNOSIS — Z79899 Other long term (current) drug therapy: Secondary | ICD-10-CM | POA: Diagnosis not present

## 2019-02-10 DIAGNOSIS — Z96652 Presence of left artificial knee joint: Secondary | ICD-10-CM | POA: Diagnosis not present

## 2019-02-10 DIAGNOSIS — Z87891 Personal history of nicotine dependence: Secondary | ICD-10-CM | POA: Diagnosis not present

## 2019-02-10 DIAGNOSIS — T84032D Mechanical loosening of internal right knee prosthetic joint, subsequent encounter: Secondary | ICD-10-CM | POA: Diagnosis not present

## 2019-02-10 DIAGNOSIS — Z7984 Long term (current) use of oral hypoglycemic drugs: Secondary | ICD-10-CM | POA: Diagnosis not present

## 2019-02-10 DIAGNOSIS — Z96651 Presence of right artificial knee joint: Secondary | ICD-10-CM | POA: Diagnosis not present

## 2019-02-10 NOTE — Patient Outreach (Signed)
  [place] [facility]) Care Management Chronic Special Needs Program  02/10/2019  Name: [name] DOB: Nov 21, 1940  MRN: [id number]  Breanna Shields is enrolled in a chronic special needs plan for Diabetes. Reviewed and updated care plan. Telephone call for transition of care call.  Member was admitted to [facility] on 02/06/19 for rt revision total knee arthroplasty and discharged home on 02/08/19  Subjective: Client states that she has just finished having therapy from the home health therapist.  States she is moving around as good as can be expected.  States her blood sugars have been a little higher since she came home but they are starting to go down.  States her dressing is on with no drainage.  States she is to go see Dr. Alvan Dame on 02/20/19 for follow up.  Goals Addressed            This Visit's Progress   . Client understands the importance of follow-up with providers by attending scheduled visits   On track   . Client will use Assistive Devices as needed and verbalize understanding of device use   On track   . Client will verbalize knowledge of self management of Hypertension as evidences by BP reading of 140/90 or less; or as defined by provider   On track   . General - Client will not be readmitted within 30 days (C-SNP)      . Maintain timely refills of diabetic medication as prescribed within the year .   On track    Transition of care call completed. Reviewed discharge instructions and s/s to call MD Encouraged to keep her follow up appointment with her doctor Instructed to continue to monitor her CBG and instructed on how there stress of surgery can make her CBG increase and to call her MD if CBGs do not return to her previous levels  Plan:  Send successful outreach letter with a copy of their individualized care plan and Send individual care plan to provider  Chronic care management coordinator will outreach in:  One month     [place],  Jackquline [place], [place] [facility] [phone]

## 2019-02-12 ENCOUNTER — Encounter: Payer: Self-pay | Admitting: *Deleted

## 2019-02-25 DIAGNOSIS — M25561 Pain in right knee: Secondary | ICD-10-CM | POA: Diagnosis not present

## 2019-02-27 DIAGNOSIS — M25561 Pain in right knee: Secondary | ICD-10-CM | POA: Diagnosis not present

## 2019-03-04 DIAGNOSIS — M25561 Pain in right knee: Secondary | ICD-10-CM | POA: Diagnosis not present

## 2019-03-06 DIAGNOSIS — M25561 Pain in right knee: Secondary | ICD-10-CM | POA: Diagnosis not present

## 2019-03-11 ENCOUNTER — Other Ambulatory Visit: Payer: Self-pay

## 2019-03-11 NOTE — Patient Outreach (Signed)
  [place] [facility]) Care Management Chronic Special Needs Program  03/11/2019  Name: Breanna Shields DOB: 06/20/1940  MRN: [id number]  [name] is enrolled in a chronic special needs plan for Diabetes. Client called with family member answering  States client is unable to come to the phone at this time HIPAA compliant message left. Plan for 2rd outreach call in 2-3 days Chronic care management coordinator will attempt outreach in  2-3 days.   Peter Garter RN, Jackquline [place], CDE Chronic Care Management Coordinator [place] [facility] Management [phone]

## 2019-03-13 ENCOUNTER — Other Ambulatory Visit: Payer: Self-pay

## 2019-03-13 NOTE — Patient Outreach (Signed)
[place] [facility]) Care Management Chronic Special Needs Program  03/13/2019  Name: Breanna Shields DOB: December 27, 1940  MRN: [id number]  Ms. Haylo Cann is enrolled in a chronic special needs plan for Diabetes. Chronic Care Management Coordinator telephoned client to review health risk assessment and to develop individualized care plan.  Reviewed the chronic care management program, importance of client participation, and taking their care plan to all provider appointments and inpatient facilities.  Reviewed the transition of care process and possible referral to community care management.  Subjective: Client states that she has been doing good after her knee surgery and she is healing well.  States that she has been going to physical therapy and she has 4 more sessions scheduled.  States she is walking without a cane or walker now.  Denies any falls.  States that her blood sugars have been coming down since her surgery and they are ranging 130-145 in the morning.  States she is to see Dr. Elyse Hsu on 04/02/19.   States she is trying to eat healthy and she has lost 15 lbs since her surgery.  States she is trying to follow a low CHO low sodium diet.  States her husband has been helping with the cooking and cleaning up  Goals Addressed            This Visit's Progress   . Client understands the importance of follow-up with providers by attending scheduled visits   On track    Plan to keep scheduled appointments with providers    . Client will use Assistive Devices as needed and verbalize understanding of device use   On track    Reports no issues with glucometer     . Client will verbalize knowledge of self management of Hypertension as evidences by BP reading of 140/90 or less; or as defined by provider   On track    Plan to check B/P regularly Take B/P medications as ordered Plan to follow a low salt diet  Increase activity as tolerated Reviewed lifestyle modification - low  sodium diet, smoking cessation, weight control. medication compliance, exercise and stress    . COMPLETED: General - Client will not be readmitted within 30 days (C-SNP)discharged 02/08/19   On track    Completed 03/13/19 No readmission within 30 days of discharge    . HEMOGLOBIN A1C < 7 (pt-stated)       Last Hemoglobin A1C 6.8% on 01/29/19 Reviewed following a low carbohydrate diet and to watch portion sizes  Diabetes self management actions:  Glucose monitoring per provider recommendations  Eat Healthy  Check feet daily  Visit provider every 3-6 months as directed  Hbg A1C level every 3-6 months.  Eye Exam yearly    . Maintain timely refills of diabetic medication as prescribed within the year .   On track    Maintaining timely refills of medications per dispense report Reinforced importance of getting medications refilled on time    . Obtain annual  Lipid Profile, LDL-C   On track    Completed 06/21/18 LDL 64 The goal for LDL is less than 70 mg/dL as you are at high risk for complications    . Obtain Annual Eye (retinal)  Exam    Not on track    Reviewed importance of annual dilated eye exam Please call to schedule eye exam    . Obtain Annual Foot Exam   On track    Check your skin and feet every day for cuts,  bruises, redness, blisters, or sores. Schedule a foot exam with your health care provider once every year    . Obtain annual screen for micro albuminuria (urine) , nephropathy (kidney problems)   On track    Completed 03/25/18 It is important for your doctor to check your urine for protein at least every year    . COMPLETED: Obtain Hemoglobin A1C at least 2 times per year       Completed 06/21/18, 09/23/18, 01/29/19 It is important to have your Hemoglobin A1C checked every 6 months if you are at goal and every 3 months if you are not at goal Reviewed Hemoglobin A1C goal, premeal and after meal blood sugar goals    . Visit Primary Care Provider or Endocrinologist at  least 2 times per year    On track    Completed 06/24/18, 09/30/18, 12/31/18 Plan to keep appointment with Dr. Elyse Hsu on 04/02/19     Client is meeting diabetes self management goal of hemoglobin A1C of <7% with last reading of 6.8% Client has follow up with provider on 04/02/19 Plan:  Send successful outreach letter with a copy of their individualized care plan, Send individual care plan to provider and Send educational material  Chronic care management coordination will outreach in:  3-4 Months to have annual outreach per Tier 1     Peter Garter RN, [employer], [place] Management [phone]

## 2019-03-19 DIAGNOSIS — Z96651 Presence of right artificial knee joint: Secondary | ICD-10-CM | POA: Diagnosis not present

## 2019-03-19 DIAGNOSIS — Z471 Aftercare following joint replacement surgery: Secondary | ICD-10-CM | POA: Diagnosis not present

## 2019-03-21 DIAGNOSIS — H8110 Benign paroxysmal vertigo, unspecified ear: Secondary | ICD-10-CM | POA: Diagnosis not present

## 2019-03-21 DIAGNOSIS — E1165 Type 2 diabetes mellitus with hyperglycemia: Secondary | ICD-10-CM | POA: Diagnosis not present

## 2019-03-21 DIAGNOSIS — Z79899 Other long term (current) drug therapy: Secondary | ICD-10-CM | POA: Diagnosis not present

## 2019-03-22 ENCOUNTER — Ambulatory Visit: Payer: HMO | Attending: Internal Medicine

## 2019-03-22 ENCOUNTER — Ambulatory Visit: Payer: Self-pay

## 2019-03-22 DIAGNOSIS — Z23 Encounter for immunization: Secondary | ICD-10-CM | POA: Insufficient documentation

## 2019-03-22 NOTE — Progress Notes (Signed)
   Covid-19 Vaccination Clinic  Name:  [name]    MRN: [id number] DOB: 1940/06/29  03/22/2019  [name] was observed post Covid-19 immunization for 15 minutes without incidence. She was provided with Vaccine Information Sheet and instruction to access the V-Safe system.   [name] was instructed to call 911 with any severe reactions post vaccine: Marland Kitchen Difficulty breathing  . Swelling of your face and throat  . A fast heartbeat  . A bad rash all over your body  . Dizziness and weakness    Immunizations Administered    Name Date Dose VIS Date Route   Pfizer COVID-19 Vaccine 03/22/2019  2:34 PM 0.3 mL 01/17/2019 Intramuscular   Manufacturer: [place]   Lot: [id number]   [place]: [id number]

## 2019-03-31 DIAGNOSIS — E1165 Type 2 diabetes mellitus with hyperglycemia: Secondary | ICD-10-CM | POA: Diagnosis not present

## 2019-03-31 DIAGNOSIS — E559 Vitamin D deficiency, unspecified: Secondary | ICD-10-CM | POA: Diagnosis not present

## 2019-03-31 DIAGNOSIS — E782 Mixed hyperlipidemia: Secondary | ICD-10-CM | POA: Diagnosis not present

## 2019-03-31 DIAGNOSIS — I10 Essential (primary) hypertension: Secondary | ICD-10-CM | POA: Diagnosis not present

## 2019-04-14 ENCOUNTER — Ambulatory Visit: Payer: HMO | Attending: Internal Medicine

## 2019-04-14 DIAGNOSIS — Z23 Encounter for immunization: Secondary | ICD-10-CM | POA: Insufficient documentation

## 2019-04-14 NOTE — Progress Notes (Signed)
   Covid-19 Vaccination Clinic  Name:  Breanna Shields    MRN: [id number] DOB: 1940-04-06  04/14/2019  [name] was observed post Covid-19 immunization for 15 minutes without incident. She was provided with Vaccine Information Sheet and instruction to access the V-Safe system.   [name] was instructed to call 911 with any severe reactions post vaccine: Marland Kitchen Difficulty breathing  . Swelling of face and throat  . A fast heartbeat  . A bad rash all over body  . Dizziness and weakness   Immunizations Administered    Name Date Dose VIS Date Route   Pfizer COVID-19 Vaccine 04/14/2019  3:21 PM 0.3 mL 01/17/2019 Intramuscular   Manufacturer: [place]   Lot: [id number]   [place]: [id number]

## 2019-05-07 DIAGNOSIS — Z96651 Presence of right artificial knee joint: Secondary | ICD-10-CM | POA: Diagnosis not present

## 2019-05-07 DIAGNOSIS — M48062 Spinal stenosis, lumbar region with neurogenic claudication: Secondary | ICD-10-CM | POA: Diagnosis not present

## 2019-05-07 DIAGNOSIS — M545 Low back pain: Secondary | ICD-10-CM | POA: Diagnosis not present

## 2019-05-21 DIAGNOSIS — Z974 Presence of external hearing-aid: Secondary | ICD-10-CM | POA: Diagnosis not present

## 2019-06-04 DIAGNOSIS — M5416 Radiculopathy, lumbar region: Secondary | ICD-10-CM | POA: Diagnosis not present

## 2019-06-04 DIAGNOSIS — Z96651 Presence of right artificial knee joint: Secondary | ICD-10-CM | POA: Diagnosis not present

## 2019-06-04 DIAGNOSIS — M5136 Other intervertebral disc degeneration, lumbar region: Secondary | ICD-10-CM | POA: Diagnosis not present

## 2019-06-12 ENCOUNTER — Other Ambulatory Visit: Payer: Self-pay

## 2019-06-12 NOTE — Patient Outreach (Signed)
[facility]) Care Management Chronic Special Needs Program  06/12/2019  Name: Breanna Shields DOB: 24-Jan-1941  MRN: [id number]  [name] is enrolled in a chronic special needs plan for Diabetes. Reviewed and updated care plan.  Subjective:Client states she has recovered from her knee surgery and her surgeon has released her.  States she is walking without a cane or walker. Denies any falls.   States her blood sugars have been good and range form 100-130 most mornings.  Denies any low readings.  States she is to see Dr. Elyse Shields in a few week for her check up.  States her B/P was good when it was last checked.  States she is trying to follow a low carbohydrate low salt diet.  States she has not been exercising any.  States she is planning to call and make her eye exam as she knows she is due for an exam.  States she has had both of her COVID shots.  Denies any issues affording her medications.    Goals Addressed            This Visit's Progress   . COMPLETED: Client understands the importance of follow-up with providers by attending scheduled visits   On track    Keeping scheduled appointments with providers Goal completed 06/12/19    . Client will use Assistive Devices as needed and verbalize understanding of device use   On track    Reports no issues with glucometer  Goal completed 06/12/19    . Client will verbalize knowledge of self management of Hypertension as evidences by BP reading of 140/90 or less; or as defined by provider   On track    Reports B/P less than 140/90 when checked Encouraged to check B/P at home Plan to check B/P regularly Take B/P medications as ordered Plan to follow a low salt diet  Increase activity as tolerated Sent EMMI-High Blood Pressure(Hypertension): What you can do     . Diabetes Patient stated goal to have dilated eye exam in the next 12 months (pt-stated)       Reinforced importance of having annual dilated eye  exam Reviewed potential long term complications of diabetes  Client has an eye doctor and plans to call to schedule appointment    . HEMOGLOBIN A1C < 7 (pt-stated)       Last Hemoglobin A1C 6.5% on 03/31/19 Reviewed fasting blood sugar goals of 80-130 and less than 180 1 1/2-2 hours after meals Reinforced to follow a low carbohydrate low salt diet and to watch portion sizes Reviewed use and possible side effects of diabetes medications  Reviewed diabetes action plan in [place] calendar Reviewed recommendations to exercise 150 minutes a week including 2 sessions of resistance training Declines [facility] [employer] referral at this time for diet and exercise goal setting     . COMPLETED: Maintain timely refills of diabetic medication as prescribed within the year .   On track    Maintaining timely refills of medications per dispense report Reinforced importance of getting medications refilled on time Goal completed 06/12/19    . Obtain annual  Lipid Profile, LDL-C   On track    Completed 03/31/19 LDL 80 The goal for LDL is less than 70 mg/dL as you are at high risk for complications Try to avoid saturated fats, trans-fats and eat more fiber Plan to take statin as ordered    . Obtain Annual Eye (retinal)  Exam  Not on track    Reviewed importance of annual dilated eye exam Please call to schedule eye exam    . Obtain Annual Foot Exam   On track    Check your skin and feet every day for cuts, bruises, redness, blisters, or sores. Schedule a foot exam with your health care provider once every year    . Obtain annual screen for micro albuminuria (urine) , nephropathy (kidney problems)   On track    Completed 03/31/19 It is important for your doctor to check your urine for protein at least every year    . Obtain Hemoglobin A1C at least 2 times per year   On track    Completed 03/31/19 It is important to have your Hemoglobin A1C checked every 6 months if you  are at goal and every 3 months if you are not at goal     . Visit Primary Care Provider or Endocrinologist at least 2 times per year    On track    Endocrinologist/primary care provider 04/02/19 Plan to keep appointment with Dr. Elyse Shields on 06/30/19 Please schedule your annual wellness visit     Client is  meeting diabetes self management goal of hemoglobin A1C of <7% with last reading of 6.5% Client declines referral to [employer] [place] for diet and exercise  Reviewed number for 24-hour [place] 19 precautions Plan:  Send successful outreach letter with a copy of their individualized care plan, Send individual care plan to provider and Send educational material EMMI-High Blood Pressure(Hypertension): What you can do  Chronic care management coordinator will outreach in:  12 months     [place], Breanna Shields, [place] Management [phone]

## 2019-06-26 DIAGNOSIS — I10 Essential (primary) hypertension: Secondary | ICD-10-CM | POA: Diagnosis not present

## 2019-06-26 DIAGNOSIS — Z0000 Encounter for general adult medical examination without abnormal findings: Secondary | ICD-10-CM | POA: Diagnosis not present

## 2019-06-26 DIAGNOSIS — Z20822 Contact with and (suspected) exposure to covid-19: Secondary | ICD-10-CM | POA: Diagnosis not present

## 2019-06-26 DIAGNOSIS — E785 Hyperlipidemia, unspecified: Secondary | ICD-10-CM | POA: Diagnosis not present

## 2019-06-26 DIAGNOSIS — E119 Type 2 diabetes mellitus without complications: Secondary | ICD-10-CM | POA: Diagnosis not present

## 2019-06-26 DIAGNOSIS — R300 Dysuria: Secondary | ICD-10-CM | POA: Diagnosis not present

## 2019-06-26 DIAGNOSIS — E559 Vitamin D deficiency, unspecified: Secondary | ICD-10-CM | POA: Diagnosis not present

## 2019-06-26 DIAGNOSIS — Z1329 Encounter for screening for other suspected endocrine disorder: Secondary | ICD-10-CM | POA: Diagnosis not present

## 2019-06-26 DIAGNOSIS — Z113 Encounter for screening for infections with a predominantly sexual mode of transmission: Secondary | ICD-10-CM | POA: Diagnosis not present

## 2019-06-26 DIAGNOSIS — Z1159 Encounter for screening for other viral diseases: Secondary | ICD-10-CM | POA: Diagnosis not present

## 2019-07-18 DIAGNOSIS — R609 Edema, unspecified: Secondary | ICD-10-CM | POA: Diagnosis not present

## 2019-07-18 DIAGNOSIS — E119 Type 2 diabetes mellitus without complications: Secondary | ICD-10-CM | POA: Diagnosis not present

## 2019-07-18 DIAGNOSIS — R9431 Abnormal electrocardiogram [ECG] [EKG]: Secondary | ICD-10-CM | POA: Diagnosis not present

## 2019-07-18 DIAGNOSIS — I517 Cardiomegaly: Secondary | ICD-10-CM | POA: Diagnosis not present

## 2019-07-18 DIAGNOSIS — R011 Cardiac murmur, unspecified: Secondary | ICD-10-CM | POA: Diagnosis not present

## 2019-07-18 DIAGNOSIS — R0989 Other specified symptoms and signs involving the circulatory and respiratory systems: Secondary | ICD-10-CM | POA: Diagnosis not present

## 2019-07-18 DIAGNOSIS — I358 Other nonrheumatic aortic valve disorders: Secondary | ICD-10-CM | POA: Diagnosis not present

## 2019-07-21 DIAGNOSIS — E782 Mixed hyperlipidemia: Secondary | ICD-10-CM | POA: Diagnosis not present

## 2019-07-21 DIAGNOSIS — E1165 Type 2 diabetes mellitus with hyperglycemia: Secondary | ICD-10-CM | POA: Diagnosis not present

## 2019-07-21 DIAGNOSIS — Z6841 Body Mass Index (BMI) 40.0 and over, adult: Secondary | ICD-10-CM | POA: Diagnosis not present

## 2019-10-20 DIAGNOSIS — E1165 Type 2 diabetes mellitus with hyperglycemia: Secondary | ICD-10-CM | POA: Diagnosis not present

## 2019-10-20 DIAGNOSIS — I10 Essential (primary) hypertension: Secondary | ICD-10-CM | POA: Diagnosis not present

## 2019-10-20 DIAGNOSIS — E782 Mixed hyperlipidemia: Secondary | ICD-10-CM | POA: Diagnosis not present

## 2019-10-24 ENCOUNTER — Encounter (HOSPITAL_COMMUNITY): Payer: Self-pay | Admitting: *Deleted

## 2019-10-24 ENCOUNTER — Other Ambulatory Visit: Payer: Self-pay

## 2019-10-24 ENCOUNTER — Inpatient Hospital Stay (HOSPITAL_COMMUNITY)
Admission: EM | Admit: 2019-10-24 | Discharge: 2019-11-04 | DRG: 519 | Disposition: A | Discharge status: Skilled Nursing Facility | Payer: HMO | Attending: Internal Medicine | Admitting: Internal Medicine

## 2019-10-24 DIAGNOSIS — R000 Tachycardia, unspecified: Secondary | ICD-10-CM | POA: Diagnosis not present

## 2019-10-24 DIAGNOSIS — S22078A Other fracture of T9-T10 vertebra, initial encounter for closed fracture: Secondary | ICD-10-CM | POA: Diagnosis not present

## 2019-10-24 DIAGNOSIS — I4891 Unspecified atrial fibrillation: Secondary | ICD-10-CM | POA: Diagnosis not present

## 2019-10-24 DIAGNOSIS — Z7401 Bed confinement status: Secondary | ICD-10-CM | POA: Diagnosis not present

## 2019-10-24 DIAGNOSIS — S22070A Wedge compression fracture of T9-T10 vertebra, initial encounter for closed fracture: Secondary | ICD-10-CM

## 2019-10-24 DIAGNOSIS — Z79899 Other long term (current) drug therapy: Secondary | ICD-10-CM

## 2019-10-24 DIAGNOSIS — Z961 Presence of intraocular lens: Secondary | ICD-10-CM | POA: Diagnosis present

## 2019-10-24 DIAGNOSIS — Z20822 Contact with and (suspected) exposure to covid-19: Secondary | ICD-10-CM | POA: Diagnosis present

## 2019-10-24 DIAGNOSIS — I361 Nonrheumatic tricuspid (valve) insufficiency: Secondary | ICD-10-CM | POA: Diagnosis not present

## 2019-10-24 DIAGNOSIS — S22009A Unspecified fracture of unspecified thoracic vertebra, initial encounter for closed fracture: Secondary | ICD-10-CM | POA: Diagnosis not present

## 2019-10-24 DIAGNOSIS — Y92009 Unspecified place in unspecified non-institutional (private) residence as the place of occurrence of the external cause: Secondary | ICD-10-CM | POA: Diagnosis not present

## 2019-10-24 DIAGNOSIS — Z9842 Cataract extraction status, left eye: Secondary | ICD-10-CM

## 2019-10-24 DIAGNOSIS — E1165 Type 2 diabetes mellitus with hyperglycemia: Secondary | ICD-10-CM | POA: Diagnosis not present

## 2019-10-24 DIAGNOSIS — F329 Major depressive disorder, single episode, unspecified: Secondary | ICD-10-CM | POA: Diagnosis present

## 2019-10-24 DIAGNOSIS — R079 Chest pain, unspecified: Secondary | ICD-10-CM | POA: Diagnosis not present

## 2019-10-24 DIAGNOSIS — F32A Depression, unspecified: Secondary | ICD-10-CM | POA: Diagnosis present

## 2019-10-24 DIAGNOSIS — F419 Anxiety disorder, unspecified: Secondary | ICD-10-CM | POA: Diagnosis present

## 2019-10-24 DIAGNOSIS — D649 Anemia, unspecified: Secondary | ICD-10-CM | POA: Diagnosis present

## 2019-10-24 DIAGNOSIS — Z452 Encounter for adjustment and management of vascular access device: Secondary | ICD-10-CM | POA: Diagnosis not present

## 2019-10-24 DIAGNOSIS — R410 Disorientation, unspecified: Secondary | ICD-10-CM | POA: Diagnosis not present

## 2019-10-24 DIAGNOSIS — Z043 Encounter for examination and observation following other accident: Secondary | ICD-10-CM | POA: Diagnosis not present

## 2019-10-24 DIAGNOSIS — S22079A Unspecified fracture of T9-T10 vertebra, initial encounter for closed fracture: Principal | ICD-10-CM | POA: Diagnosis present

## 2019-10-24 DIAGNOSIS — N390 Urinary tract infection, site not specified: Secondary | ICD-10-CM | POA: Diagnosis present

## 2019-10-24 DIAGNOSIS — W19XXXA Unspecified fall, initial encounter: Secondary | ICD-10-CM | POA: Diagnosis not present

## 2019-10-24 DIAGNOSIS — Z6841 Body Mass Index (BMI) 40.0 and over, adult: Secondary | ICD-10-CM | POA: Diagnosis not present

## 2019-10-24 DIAGNOSIS — Z87891 Personal history of nicotine dependence: Secondary | ICD-10-CM | POA: Diagnosis not present

## 2019-10-24 DIAGNOSIS — F05 Delirium due to known physiological condition: Secondary | ICD-10-CM | POA: Diagnosis not present

## 2019-10-24 DIAGNOSIS — Z96653 Presence of artificial knee joint, bilateral: Secondary | ICD-10-CM | POA: Diagnosis present

## 2019-10-24 DIAGNOSIS — Z9841 Cataract extraction status, right eye: Secondary | ICD-10-CM | POA: Diagnosis not present

## 2019-10-24 DIAGNOSIS — W109XXA Fall (on) (from) unspecified stairs and steps, initial encounter: Secondary | ICD-10-CM | POA: Diagnosis present

## 2019-10-24 DIAGNOSIS — S22071A Stable burst fracture of T9-T10 vertebra, initial encounter for closed fracture: Secondary | ICD-10-CM | POA: Diagnosis not present

## 2019-10-24 DIAGNOSIS — Z8249 Family history of ischemic heart disease and other diseases of the circulatory system: Secondary | ICD-10-CM | POA: Diagnosis not present

## 2019-10-24 DIAGNOSIS — E119 Type 2 diabetes mellitus without complications: Secondary | ICD-10-CM | POA: Diagnosis present

## 2019-10-24 DIAGNOSIS — E782 Mixed hyperlipidemia: Secondary | ICD-10-CM | POA: Diagnosis present

## 2019-10-24 DIAGNOSIS — Z419 Encounter for procedure for purposes other than remedying health state, unspecified: Secondary | ICD-10-CM

## 2019-10-24 DIAGNOSIS — Z809 Family history of malignant neoplasm, unspecified: Secondary | ICD-10-CM | POA: Diagnosis not present

## 2019-10-24 DIAGNOSIS — M542 Cervicalgia: Secondary | ICD-10-CM | POA: Diagnosis not present

## 2019-10-24 DIAGNOSIS — I10 Essential (primary) hypertension: Secondary | ICD-10-CM | POA: Diagnosis present

## 2019-10-24 DIAGNOSIS — Z7984 Long term (current) use of oral hypoglycemic drugs: Secondary | ICD-10-CM | POA: Diagnosis not present

## 2019-10-24 DIAGNOSIS — R0902 Hypoxemia: Secondary | ICD-10-CM | POA: Diagnosis not present

## 2019-10-24 DIAGNOSIS — M2550 Pain in unspecified joint: Secondary | ICD-10-CM | POA: Diagnosis not present

## 2019-10-24 DIAGNOSIS — S72001D Fracture of unspecified part of neck of right femur, subsequent encounter for closed fracture with routine healing: Secondary | ICD-10-CM | POA: Diagnosis not present

## 2019-10-24 DIAGNOSIS — D72829 Elevated white blood cell count, unspecified: Secondary | ICD-10-CM | POA: Diagnosis not present

## 2019-10-24 DIAGNOSIS — I071 Rheumatic tricuspid insufficiency: Secondary | ICD-10-CM | POA: Diagnosis present

## 2019-10-24 DIAGNOSIS — J9811 Atelectasis: Secondary | ICD-10-CM | POA: Diagnosis not present

## 2019-10-24 DIAGNOSIS — B9620 Unspecified Escherichia coli [E. coli] as the cause of diseases classified elsewhere: Secondary | ICD-10-CM | POA: Diagnosis present

## 2019-10-24 DIAGNOSIS — I959 Hypotension, unspecified: Secondary | ICD-10-CM | POA: Diagnosis not present

## 2019-10-24 DIAGNOSIS — R451 Restlessness and agitation: Secondary | ICD-10-CM | POA: Diagnosis not present

## 2019-10-24 DIAGNOSIS — M5489 Other dorsalgia: Secondary | ICD-10-CM | POA: Diagnosis not present

## 2019-10-24 DIAGNOSIS — R52 Pain, unspecified: Secondary | ICD-10-CM | POA: Diagnosis not present

## 2019-10-24 DIAGNOSIS — R9082 White matter disease, unspecified: Secondary | ICD-10-CM | POA: Diagnosis not present

## 2019-10-24 DIAGNOSIS — B961 Klebsiella pneumoniae [K. pneumoniae] as the cause of diseases classified elsewhere: Secondary | ICD-10-CM | POA: Diagnosis present

## 2019-10-24 DIAGNOSIS — M546 Pain in thoracic spine: Secondary | ICD-10-CM | POA: Diagnosis not present

## 2019-10-24 DIAGNOSIS — S22070D Wedge compression fracture of T9-T10 vertebra, subsequent encounter for fracture with routine healing: Secondary | ICD-10-CM | POA: Diagnosis not present

## 2019-10-24 DIAGNOSIS — I6782 Cerebral ischemia: Secondary | ICD-10-CM | POA: Diagnosis not present

## 2019-10-24 NOTE — ED Notes (Signed)
The pt is alert and o x 4

## 2019-10-24 NOTE — ED Triage Notes (Signed)
The pt  Arrived by gems from home  The pt slipped on the next to bottom step and fell backward when she lost her balance.. she is c/o pain in her thoracic spine  Alert oriented skin warm and dry  Pt is c/o severe pain  Also c/o nausea  Abrasion to her rt elbow  She takes aspirin  No previous back injury

## 2019-10-25 ENCOUNTER — Emergency Department (HOSPITAL_COMMUNITY): Payer: HMO

## 2019-10-25 ENCOUNTER — Encounter (HOSPITAL_COMMUNITY): Payer: Self-pay | Admitting: Radiology

## 2019-10-25 ENCOUNTER — Other Ambulatory Visit: Payer: Self-pay

## 2019-10-25 DIAGNOSIS — Z20822 Contact with and (suspected) exposure to covid-19: Secondary | ICD-10-CM | POA: Diagnosis present

## 2019-10-25 DIAGNOSIS — Y92009 Unspecified place in unspecified non-institutional (private) residence as the place of occurrence of the external cause: Secondary | ICD-10-CM | POA: Diagnosis not present

## 2019-10-25 DIAGNOSIS — J9811 Atelectasis: Secondary | ICD-10-CM | POA: Diagnosis not present

## 2019-10-25 DIAGNOSIS — F419 Anxiety disorder, unspecified: Secondary | ICD-10-CM | POA: Diagnosis present

## 2019-10-25 DIAGNOSIS — E782 Mixed hyperlipidemia: Secondary | ICD-10-CM | POA: Diagnosis present

## 2019-10-25 DIAGNOSIS — Z809 Family history of malignant neoplasm, unspecified: Secondary | ICD-10-CM | POA: Diagnosis not present

## 2019-10-25 DIAGNOSIS — Z96653 Presence of artificial knee joint, bilateral: Secondary | ICD-10-CM | POA: Diagnosis present

## 2019-10-25 DIAGNOSIS — E119 Type 2 diabetes mellitus without complications: Secondary | ICD-10-CM

## 2019-10-25 DIAGNOSIS — I361 Nonrheumatic tricuspid (valve) insufficiency: Secondary | ICD-10-CM | POA: Diagnosis not present

## 2019-10-25 DIAGNOSIS — S22079A Unspecified fracture of T9-T10 vertebra, initial encounter for closed fracture: Secondary | ICD-10-CM | POA: Diagnosis present

## 2019-10-25 DIAGNOSIS — Z6841 Body Mass Index (BMI) 40.0 and over, adult: Secondary | ICD-10-CM | POA: Diagnosis not present

## 2019-10-25 DIAGNOSIS — D649 Anemia, unspecified: Secondary | ICD-10-CM | POA: Diagnosis present

## 2019-10-25 DIAGNOSIS — F32A Depression, unspecified: Secondary | ICD-10-CM | POA: Diagnosis present

## 2019-10-25 DIAGNOSIS — Z87891 Personal history of nicotine dependence: Secondary | ICD-10-CM | POA: Diagnosis not present

## 2019-10-25 DIAGNOSIS — W109XXA Fall (on) (from) unspecified stairs and steps, initial encounter: Secondary | ICD-10-CM | POA: Diagnosis present

## 2019-10-25 DIAGNOSIS — F05 Delirium due to known physiological condition: Secondary | ICD-10-CM | POA: Diagnosis not present

## 2019-10-25 DIAGNOSIS — N390 Urinary tract infection, site not specified: Secondary | ICD-10-CM | POA: Diagnosis present

## 2019-10-25 DIAGNOSIS — Z961 Presence of intraocular lens: Secondary | ICD-10-CM | POA: Diagnosis present

## 2019-10-25 DIAGNOSIS — D72829 Elevated white blood cell count, unspecified: Secondary | ICD-10-CM | POA: Diagnosis present

## 2019-10-25 DIAGNOSIS — B961 Klebsiella pneumoniae [K. pneumoniae] as the cause of diseases classified elsewhere: Secondary | ICD-10-CM | POA: Diagnosis present

## 2019-10-25 DIAGNOSIS — R410 Disorientation, unspecified: Secondary | ICD-10-CM | POA: Diagnosis not present

## 2019-10-25 DIAGNOSIS — Z7984 Long term (current) use of oral hypoglycemic drugs: Secondary | ICD-10-CM | POA: Diagnosis not present

## 2019-10-25 DIAGNOSIS — Z8249 Family history of ischemic heart disease and other diseases of the circulatory system: Secondary | ICD-10-CM | POA: Diagnosis not present

## 2019-10-25 DIAGNOSIS — F329 Major depressive disorder, single episode, unspecified: Secondary | ICD-10-CM | POA: Diagnosis present

## 2019-10-25 DIAGNOSIS — Z79899 Other long term (current) drug therapy: Secondary | ICD-10-CM | POA: Diagnosis not present

## 2019-10-25 DIAGNOSIS — Z9841 Cataract extraction status, right eye: Secondary | ICD-10-CM | POA: Diagnosis not present

## 2019-10-25 DIAGNOSIS — I4891 Unspecified atrial fibrillation: Secondary | ICD-10-CM | POA: Diagnosis not present

## 2019-10-25 DIAGNOSIS — I10 Essential (primary) hypertension: Secondary | ICD-10-CM | POA: Diagnosis present

## 2019-10-25 DIAGNOSIS — Z9842 Cataract extraction status, left eye: Secondary | ICD-10-CM | POA: Diagnosis not present

## 2019-10-25 DIAGNOSIS — R0902 Hypoxemia: Secondary | ICD-10-CM | POA: Diagnosis not present

## 2019-10-25 LAB — URINALYSIS, ROUTINE W REFLEX MICROSCOPIC
Bilirubin Urine: NEGATIVE
Glucose, UA: NEGATIVE mg/dL
Hgb urine dipstick: NEGATIVE
Ketones, ur: NEGATIVE mg/dL
Nitrite: POSITIVE — AB
Protein, ur: NEGATIVE mg/dL
Specific Gravity, Urine: 1.017 (ref 1.005–1.030)
pH: 6 (ref 5.0–8.0)

## 2019-10-25 LAB — BASIC METABOLIC PANEL
Anion gap: 9 (ref 5–15)
BUN: 23 mg/dL (ref 8–23)
CO2: 26 mmol/L (ref 22–32)
Calcium: 8.7 mg/dL — ABNORMAL LOW (ref 8.9–10.3)
Chloride: 104 mmol/L (ref 98–111)
Creatinine, Ser: 1 mg/dL (ref 0.44–1.00)
GFR calc Af Amer: >60 mL/min (ref >60)
GFR calc non Af Amer: 54 mL/min — ABNORMAL LOW (ref >60)
Glucose, Bld: 171 mg/dL — ABNORMAL HIGH (ref 70–99)
Potassium: 4.2 mmol/L (ref 3.5–5.1)
Sodium: 139 mmol/L (ref 135–145)

## 2019-10-25 LAB — TSH: TSH: 1.471 u[IU]/mL (ref 0.350–4.500)

## 2019-10-25 LAB — CBC
HCT: 36.7 % (ref 36.0–46.0)
Hemoglobin: 11.1 g/dL — ABNORMAL LOW (ref 12.0–15.0)
MCH: 30.1 pg (ref 26.0–34.0)
MCHC: 30.2 g/dL (ref 30.0–36.0)
MCV: 99.5 fL (ref 80.0–100.0)
Platelets: 251 10*3/uL (ref 150–400)
RBC: 3.69 MIL/uL — ABNORMAL LOW (ref 3.87–5.11)
RDW: 13.2 % (ref 11.5–15.5)
WBC: 12.3 10*3/uL — ABNORMAL HIGH (ref 4.0–10.5)
nRBC: 0 % (ref 0.0–0.2)

## 2019-10-25 LAB — GLUCOSE, CAPILLARY
Glucose-Capillary: 177 mg/dL — ABNORMAL HIGH (ref 70–99)
Glucose-Capillary: 204 mg/dL — ABNORMAL HIGH (ref 70–99)

## 2019-10-25 LAB — MAGNESIUM: Magnesium: 1.8 mg/dL (ref 1.7–2.4)

## 2019-10-25 LAB — PHOSPHORUS: Phosphorus: 2.4 mg/dL — ABNORMAL LOW (ref 2.5–4.6)

## 2019-10-25 LAB — SURGICAL PCR SCREEN
MRSA, PCR: NEGATIVE
Staphylococcus aureus: NEGATIVE

## 2019-10-25 LAB — SARS CORONAVIRUS 2 BY RT PCR (HOSPITAL ORDER, PERFORMED IN ~~LOC~~ HOSPITAL LAB): SARS Coronavirus 2: NEGATIVE

## 2019-10-25 LAB — HEMOGLOBIN A1C
Hgb A1c MFr Bld: 7.9 % — ABNORMAL HIGH (ref 4.8–5.6)
Mean Plasma Glucose: 180.03 mg/dL

## 2019-10-25 MED ORDER — ONDANSETRON HCL 4 MG/2ML IJ SOLN
4.0000 mg | Freq: Four times a day (QID) | INTRAMUSCULAR | Status: DC | PRN
  Administered 2019-10-25 – 2019-10-26 (×2): 4 mg via INTRAVENOUS
  Filled 2019-10-25 (×2): qty 2

## 2019-10-25 MED ORDER — MORPHINE SULFATE (PF) 4 MG/ML IV SOLN
4.0000 mg | Freq: Once | INTRAVENOUS | Status: AC
  Administered 2019-10-25: 4 mg via INTRAVENOUS
  Filled 2019-10-25: qty 1

## 2019-10-25 MED ORDER — ONDANSETRON HCL 4 MG/2ML IJ SOLN
4.0000 mg | Freq: Once | INTRAMUSCULAR | Status: AC
  Administered 2019-10-25: 4 mg via INTRAVENOUS
  Filled 2019-10-25: qty 2

## 2019-10-25 MED ORDER — OXYCODONE HCL 5 MG PO TABS
5.0000 mg | ORAL_TABLET | ORAL | Status: DC | PRN
  Filled 2019-10-25: qty 1

## 2019-10-25 MED ORDER — ENOXAPARIN SODIUM 40 MG/0.4ML ~~LOC~~ SOLN
40.0000 mg | SUBCUTANEOUS | Status: DC
  Administered 2019-10-25: 40 mg via SUBCUTANEOUS
  Filled 2019-10-25: qty 0.4

## 2019-10-25 MED ORDER — ACETAMINOPHEN 325 MG PO TABS
650.0000 mg | ORAL_TABLET | Freq: Four times a day (QID) | ORAL | Status: DC | PRN
  Administered 2019-10-27 – 2019-11-04 (×7): 650 mg via ORAL
  Filled 2019-10-25 (×8): qty 2

## 2019-10-25 MED ORDER — INSULIN ASPART 100 UNIT/ML ~~LOC~~ SOLN
0.0000 [IU] | Freq: Three times a day (TID) | SUBCUTANEOUS | Status: DC
  Administered 2019-10-25: 3 [IU] via SUBCUTANEOUS
  Administered 2019-10-26: 8 [IU] via SUBCUTANEOUS
  Administered 2019-10-26 – 2019-10-28 (×6): 3 [IU] via SUBCUTANEOUS
  Administered 2019-10-29: 5 [IU] via SUBCUTANEOUS
  Administered 2019-10-29 – 2019-10-30 (×3): 3 [IU] via SUBCUTANEOUS
  Administered 2019-10-30: 2 [IU] via SUBCUTANEOUS
  Administered 2019-10-30: 3 [IU] via SUBCUTANEOUS
  Administered 2019-10-31 – 2019-11-03 (×7): 2 [IU] via SUBCUTANEOUS
  Administered 2019-11-03: 5 [IU] via SUBCUTANEOUS
  Administered 2019-11-04: 2 [IU] via SUBCUTANEOUS
  Administered 2019-11-04: 3 [IU] via SUBCUTANEOUS

## 2019-10-25 MED ORDER — ONDANSETRON HCL 4 MG PO TABS: 4.0000 mg | ORAL_TABLET | Freq: Four times a day (QID) | ORAL | Status: DC | PRN

## 2019-10-25 MED ORDER — ACETAMINOPHEN 650 MG RE SUPP: 650.0000 mg | Freq: Four times a day (QID) | RECTAL | Status: DC | PRN

## 2019-10-25 MED ORDER — HYDROMORPHONE HCL 1 MG/ML IJ SOLN
0.5000 mg | INTRAMUSCULAR | Status: DC | PRN
  Administered 2019-10-26: 0.5 mg via INTRAVENOUS
  Filled 2019-10-25: qty 1

## 2019-10-25 MED ORDER — INSULIN ASPART 100 UNIT/ML ~~LOC~~ SOLN
0.0000 [IU] | Freq: Every day | SUBCUTANEOUS | Status: DC
  Administered 2019-10-25: 2 [IU] via SUBCUTANEOUS
  Administered 2019-10-26: 3 [IU] via SUBCUTANEOUS

## 2019-10-25 MED ORDER — HYDRALAZINE HCL 20 MG/ML IJ SOLN
10.0000 mg | Freq: Four times a day (QID) | INTRAMUSCULAR | Status: DC | PRN
  Administered 2019-10-26 – 2019-10-27 (×2): 10 mg via INTRAVENOUS
  Filled 2019-10-25: qty 1

## 2019-10-25 NOTE — ED Notes (Signed)
Pt to MRI

## 2019-10-25 NOTE — ED Notes (Signed)
Daughter [name] updated

## 2019-10-25 NOTE — Progress Notes (Signed)
Orthopedic Tech Progress Note Patient Details:  Breanna Shields Feb 28, 1940 [id number] Applied TLSO brace to patient. Ortho Devices Type of Ortho Device: Other (comment) Ortho Device/Splint Location: Back brace TLSO Ortho Device/Splint Interventions: Application, Ordered   Post Interventions Patient Tolerated: Well Instructions Provided: Adjustment of device, Care of device   Raife Lizer [name] 10/25/2019, 3:50 PM

## 2019-10-25 NOTE — ED Notes (Signed)
hospitalist at bedside

## 2019-10-25 NOTE — ED Notes (Signed)
Pt's daughter, [name], updated about admission.

## 2019-10-25 NOTE — H&P (Addendum)
History and Physical    DAE ANTONUCCI [id number] DOB: 05/17/1940 DOA: 10/24/2019  PCP: Lorne Skeens, MD  Patient coming from: home  I have personally briefly reviewed patient's old medical records in [facility]  Chief Complaint: fall  HPI: Breanna Shields is a 79 y.o. female with medical history significant of hypertension, hyperlipidemia, type 2 diabetes mellitus, morbid obesity with BMI of 41 presents to emergency department for evaluation of back pain after fall.  Patient tells me that she was trying to bring in groceries when she lost her balance and fell yesterday.  Reports severe back pain, 8 out of 10, nonradiating, aggravates with movements, relieved with nothing, denies association with numbness tingling or weakness in bilateral lower or upper extremities, denies urinary or bowel incontinence.  She denies headache, blurry vision, lightheadedness, dizziness prior or after the fall.  She denies chest pain, shortness of breath, palpitation, head trauma, loss of consciousness, seizures, leg swelling, nausea, vomiting, abdominal pain, urinary or bowel changes.  She lives with her husband at home.  No history of smoking, alcohol, illicit drug use.  She does not use cane or walker for ambulation at baseline.  She is independent on daily life activities.  ED Course: Upon arrival to ED: Patient blood pressure noted to be elevated, she is afebrile with leukocytosis of 12.3, COVID-19 negative.  Chest x-ray, CT head and cervical spine: Negative for acute findings.  CT thoracic and MRI of thoracic spine shows T9 vertebral body fracture.  EDP consulted neurosurgery.  [facility] hospitalist consulted for admission for pain management.  Review of Systems: As per HPI otherwise negative.    Past Medical History:  Diagnosis Date  . Allergy   . Diabetes mellitus    type ii  . Hyperlipidemia   . Hypertension     Past Surgical History:  Procedure Laterality Date  . CATARACT  EXTRACTION W/ INTRAOCULAR LENS  IMPLANT, BILATERAL    . endometrial polyp  07/1998  . REPLACEMENT TOTAL KNEE BILATERAL  2007 & 2006  . TOTAL KNEE REVISION Right 02/06/2019   Procedure: TOTAL KNEE REVISION;  Surgeon: Paralee Cancel, MD;  Location: [facility] ORS;  Service: Orthopedics;  Laterality: Right;  90 mins     reports that she quit smoking about 8 years ago. Her smoking use included cigarettes. She has a 25.00 pack-year smoking history. She has never used smokeless tobacco. She reports current alcohol use. She reports that she does not use drugs.  Allergies  Allergen Reactions  . Chantix [Varenicline Tartrate] Nausea Only and Other (See Comments)    Dizziness  . Oxytrol [Oxybutynin Base] Other (See Comments)    Dry mouth     Family History  Problem Relation Age of Onset  . Heart failure Father   . Heart failure Mother   . Cancer Brother   . Heart failure Brother        x6    Prior to Admission medications   Medication Sig Start Date End Date Taking? Authorizing Provider  atorvastatin (LIPITOR) 80 MG tablet Take 40 mg by mouth every evening.  12/12/10   [provider]  B Complex-C (B-COMPLEX WITH VITAMIN C) tablet Take 1 tablet by mouth daily.    [provider]  Cholecalciferol (VITAMIN D3) 50 MCG (2000 UT) TABS Take 2,000 Units by mouth daily.    [provider]  escitalopram (LEXAPRO) 10 MG tablet Take 10 mg by mouth daily. 03/21/19   [provider]  HYDROcodone-acetaminophen (NORCO/VICODIN) 5-325 MG  tablet Take 1-2 tablets by mouth every 4 (four) hours as needed for moderate pain (pain score 4-6). 02/08/19   Cecilie Kicks, PA-C  Multiple Vitamins-Minerals (MULTIVITAMIN WITH MINERALS) tablet Take 1 tablet by mouth daily.      [provider]  olmesartan (BENICAR) 40 MG tablet Take 40 mg by mouth daily. 09/28/18   [provider]  ONE TOUCH ULTRA TEST test strip  12/10/10   [provider]  pioglitazone (ACTOS) 30 MG  tablet Take 30 mg by mouth Daily.  12/22/10   [provider]  TRULICITY 4.82 NO/0.3BC SOPN Inject 0.75 mg into the skin every Tuesday at 6 PM. 01/10/19   [provider]    Physical Exam: Vitals:   10/25/19 1245 10/25/19 1300 10/25/19 1315 10/25/19 1330  BP: (!) 151/52 (!) 155/144 (!) 146/62 (!) 156/69  Pulse: 79 79 74 78  Resp:      Temp:      TempSrc:      SpO2: 96% 99% 98% 100%  Weight:      Height:        Constitutional: NAD, calm, comfortable, communicating well, obese Eyes: PERRL, lids and conjunctivae normal ENMT: Mucous membranes are moist. Posterior pharynx clear of any exudate or lesions.Normal dentition.  Neck: normal, supple, no masses, no thyromegaly Respiratory: clear to auscultation bilaterally, no wheezing, no crackles. Normal respiratory effort. No accessory muscle use.  Cardiovascular: Regular rate and rhythm, no murmurs / rubs / gallops. No extremity edema. 2+ pedal pulses. No carotid bruits.  Abdomen: no tenderness, no masses palpated. No hepatosplenomegaly. Bowel sounds positive.  Musculoskeletal: Middle back tenderness positive.   Skin: no rashes, lesions, ulcers. No induration Neurologic: CN 2-12 grossly intact. Sensation intact, DTR normal. Strength 5/5 in all 4.  Psychiatric: Normal judgment and insight. Alert and oriented x 3. Normal mood.    Labs on Admission: I have personally reviewed following labs and imaging studies  CBC: Recent Labs  Lab 10/25/19 0058  WBC 12.3*  HGB 11.1*  HCT 36.7  MCV 99.5  PLT 488   Basic Metabolic Panel: Recent Labs  Lab 10/25/19 0058  NA 139  K 4.2  CL 104  CO2 26  GLUCOSE 171*  BUN 23  CREATININE 1.00  CALCIUM 8.7*   GFR: Estimated Creatinine Clearance: 54.5 mL/min (by C-G formula based on SCr of 1 mg/dL). Liver Function Tests: No results for input(s): AST, ALT, ALKPHOS, BILITOT, PROT, ALBUMIN in the last 168 hours. No results for input(s): LIPASE, AMYLASE in the last 168 hours. No  results for input(s): AMMONIA in the last 168 hours. Coagulation Profile: No results for input(s): INR, PROTIME in the last 168 hours. Cardiac Enzymes: No results for input(s): CKTOTAL, CKMB, CKMBINDEX, TROPONINI in the last 168 hours. BNP (last 3 results) No results for input(s): PROBNP in the last 8760 hours. HbA1C: No results for input(s): HGBA1C in the last 72 hours. CBG: No results for input(s): GLUCAP in the last 168 hours. Lipid Profile: No results for input(s): CHOL, HDL, LDLCALC, TRIG, CHOLHDL, LDLDIRECT in the last 72 hours. Thyroid Function Tests: No results for input(s): TSH, T4TOTAL, FREET4, T3FREE, THYROIDAB in the last 72 hours. Anemia Panel: No results for input(s): VITAMINB12, FOLATE, FERRITIN, TIBC, IRON, RETICCTPCT in the last 72 hours. Urine analysis: No results found for: COLORURINE, APPEARANCEUR, LABSPEC, [place], GLUCOSEU, HGBUR, BILIRUBINUR, KETONESUR, PROTEINUR, UROBILINOGEN, NITRITE, LEUKOCYTESUR  Radiological Exams on Admission: DG Chest 1 View  Result Date: 10/25/2019 CLINICAL DATA:  Fall, chest pain EXAM:  CHEST  1 VIEW COMPARISON:  None. FINDINGS: Lungs are clear. No pneumothorax or pleural effusion. Cardiac size is mildly enlarged. Pulmonary vascularity is normal. No acute bone abnormality. IMPRESSION: No active disease. Electronically Signed   By: Fidela Salisbury MD   On: 10/25/2019 01:33   DG Thoracic Spine 2 View  Addendum Date: 10/25/2019   ADDENDUM REPORT: 10/25/2019 01:33 ADDENDUM: These results were called by telephone at the time of interpretation on 10/25/2019 at 1:31 am to provider [facility] , who verbally acknowledged these results. Electronically Signed   By: Fidela Salisbury MD   On: 10/25/2019 01:33   Result Date: 10/25/2019 CLINICAL DATA:  Fall, back pain EXAM: THORACIC SPINE 2 VIEWS COMPARISON:  None. FINDINGS: There is a a transverse fracture involving the inferior endplate of T9 with mild angular deformity of the thoracic spine at  this level. There is mild paravertebral stripe widening in this region likely representing edema or hemorrhage secondary to acute fracture. No associated listhesis. Degenerative changes are seen throughout the thoracic spine. Vertebral body height has been preserved. IMPRESSION: Acute fracture of the thoracic spine with angular deformity at T9. CT examination of the thoracic spine is recommended for further evaluation. Electronically Signed: By: Fidela Salisbury MD On: 10/25/2019 01:29   DG Lumbar Spine Complete  Result Date: 10/25/2019 CLINICAL DATA:  Fall, back EXAM: LUMBAR SPINE - COMPLETE 4+ VIEW COMPARISON:  None. FINDINGS: There is normal lumbar lordosis. No acute fracture or listhesis of the lumbar spine. Vertebral body height has been preserved. There is diffuse intervertebral disc space narrowing and endplate remodeling throughout the lumbar spine in keeping with changes of mild to moderate degenerative disc disease. Oblique views demonstrate no evidence of pars defect. The paraspinal soft tissues are unremarkable. Vascular calcifications are seen within the abdominal aorta anterior to the lumbar spine. IMPRESSION: No acute fracture or listhesis.  Diffuse degenerative disc disease. Electronically Signed   By: Fidela Salisbury MD   On: 10/25/2019 01:30   CT HEAD WO CONTRAST  Result Date: 10/25/2019 CLINICAL DATA:  Slipped and fell EXAM: CT HEAD WITHOUT CONTRAST TECHNIQUE: Contiguous axial images were obtained from the base of the skull through the vertex without intravenous contrast. COMPARISON:  None. FINDINGS: Brain: No acute infarct or hemorrhage. Lateral ventricles and midline structures are unremarkable. No acute extra-axial fluid collections. No mass effect. Vascular: No hyperdense vessel or unexpected calcification. Skull: Normal. Negative for fracture or focal lesion. Sinuses/Orbits: No acute finding. Other: None. IMPRESSION: 1. No acute intracranial process. Electronically Signed   By: Randa Ngo M.D.   On: 10/25/2019 01:11   CT CERVICAL SPINE WO CONTRAST  Result Date: 10/25/2019 CLINICAL DATA:  Slipped and fell EXAM: CT CERVICAL SPINE WITHOUT CONTRAST TECHNIQUE: Multidetector CT imaging of the cervical spine was performed without intravenous contrast. Multiplanar CT image reconstructions were also generated. COMPARISON:  None. FINDINGS: Alignment: Alignment is anatomic. Skull base and vertebrae: No acute displaced cervical spine fracture. Soft tissues and spinal canal: No prevertebral fluid or swelling. No visible canal hematoma. Disc levels: There is mild diffuse cervical spondylosis, eccentric to the right. Mild C6/C7 spondylosis. Mild right neural foraminal narrowing at C4-5 and C5-6. Upper chest: Airway is patent.  Lung apices are clear. Other: Reconstructed images demonstrate no additional findings. IMPRESSION: 1. No acute cervical spine fracture. 2. Multilevel cervical spondylosis and facet hypertrophy. Electronically Signed   By: Randa Ngo M.D.   On: 10/25/2019 01:09   CT Thoracic Spine Wo Contrast  Result Date:  10/25/2019 CLINICAL DATA:  Traumatic spinal fracture EXAM: CT THORACIC SPINE WITHOUT CONTRAST TECHNIQUE: Multidetector CT images of the thoracic were obtained using the standard protocol without intravenous contrast. COMPARISON:  10/25/2019 thoracic spine radiograph FINDINGS: Alignment: Physiologic Vertebrae: There is an acute fracture of T9 that involves the anterior wall and superior endplate. The fracture line also traverses a right-sided osteophyte between T9 and T8. There is ankylosis of the thoracic spine due to flowing anterior enthesophytes. Paraspinal and other soft tissues: Small right pleural effusion. There are coronary artery calcifications and calcific aortic atherosclerosis. Disc levels: There is no spinal canal stenosis. IMPRESSION: 1. Mildly distracted fracture of T9 in the context of a rigid midthoracic spine. Thoracic spine MRI is recommended to assess  for spinal cord injury and ligamentous damage. 2. Small right pleural effusion. Aortic Atherosclerosis (ICD10-I70.0). Electronically Signed   By: Ulyses Jarred M.D.   On: 10/25/2019 03:08   MR THORACIC SPINE WO CONTRAST  Result Date: 10/25/2019 CLINICAL DATA:  Fall.  Traumatic spinal fracture. EXAM: MRI THORACIC SPINE WITHOUT CONTRAST TECHNIQUE: Multiplanar, multisequence MR imaging of the thoracic spine was performed. No intravenous contrast was administered. COMPARISON:  CT thoracic spine 10/25/2019 FINDINGS: Alignment: Mild anterolisthesis T1-2 and T2-3. Mild to moderate dorsal kyphosis. Vertebrae: Fracture T9 vertebral body. Well-defined fracture line is seen in the vertebral body extending to the inferior endplate. Improved alignment compared with the CT when there was distraction of the inferior portion of the fracture. Surprisingly, there is minimal bone marrow edema around the fracture. The fracture appears acute on CT. No other fracture identified. No epidural hematoma. Negative for mass lesion. Cord:  Normal signal and morphology.  No cord compression Paraspinal and other soft tissues: Small right pleural effusion. No paraspinous hematoma. Disc levels: Multilevel disc degeneration throughout the thoracic spine. Small right-sided disc protrusion at T6-7. Small left-sided disc protrusion and spurring at T7-8. Small central disc protrusion and spurring at T8-9. Small central disc protrusion T2-10 11 and T11-12. IMPRESSION: Fracture T9 vertebral body. This fracture shows improved alignment compared with CT earlier today with decreased distraction. The fracture appears acute on CT. Surprisingly there is minimal bone marrow edema. No epidural hematoma or spinal stenosis. Electronically Signed   By: Franchot Gallo M.D.   On: 10/25/2019 12:15    Assessment/Plan Principal Problem:   T9 vertebral fracture (HCC) Active Problems:   Mixed dyslipidemia   Hypertension, essential   DM (diabetes mellitus)  ([place])   Depression   Leukocytosis    T9 vertebral fracture: Status post fall. -Reviewed CT head/cervical spine, chest x-ray: Negative.  Reviewed CT thoracic spine, MRI thoracic spine. -COVID-19 negative. -EDP consulted neurosurgery-recommend thoracic fusion from T7-T11 tomorrow.  -N.p.o. after midnight. -Tylenol/oxycodone/Dilaudid as needed for mild/moderate/severe pain respectively.  Zofran as needed for nausea and vomiting. -Monitor vitals closely. -Consult PT/OT. -On fall precautions.  Hypertension: Blood pressure elevated upon arrival-likely secondary to pain. -Continue home meds olmesartan.  Monitor blood pressure closely.  Hydralazine as needed for blood pressure more than 160/100.  Hyperlipidemia: Continue statin  Diabetes mellitus: Hold Actos and Trulicity and started patient on sliding scale insulin.  Check A1c and monitor blood sugar closely.  Depression/anxiety: Continue Lexapro  Leukocytosis: WBC 12.3.  Likely reactive. -Chest x-ray negative for pneumonia.  COVID-19 negative.  Check UA.  Repeat CBC tomorrow AM.  She is afebrile.  Morbid obesity with BMI of 41: -Diet modification/weight loss recommended.  DVT prophylaxis: SCD Code Status: Full code Family Communication: None present at bedside.  Plan of  care discussed with patient in length and she verbalized understanding and agreed with it. Disposition Plan: Likely rehab in 1 to 2 days Consults called: Neurosurgery by EDP Admission status: Inpatient   Mckinley Jewel MD [facility]  If 7PM-7AM, please contact night-coverage [email or web address] Password [facility]  10/25/2019, 2:02 PM

## 2019-10-25 NOTE — ED Notes (Signed)
Breanna Shields, daughter, [phone] would like an update when available

## 2019-10-25 NOTE — ED Provider Notes (Signed)
[facility] EMERGENCY DEPARTMENT Provider Note   CSN: [id number] Arrival date & time:        History Chief Complaint  Patient presents with  . Fall    Breanna Shields is a 79 y.o. female.  Patient presents to the emergency department with back pain after a fall.  Patient reports that she was trying to bring in groceries when she fell.  She had gone up 1 or 2 steps to get into her house and lost her balance, falling backwards.  She hit her head and her back on the ground.  Patient complaining of moderate to severe pain in the mid back portion, just to the right of midline.  Pain worsens with movement.  No numbness, tingling, weakness of extremities.  She did not lose consciousness.        Past Medical History:  Diagnosis Date  . Allergy   . Diabetes mellitus    type ii  . Hyperlipidemia   . Hypertension     Patient Active Problem List   Diagnosis Date Noted  . S/P revision of total knee, right 02/06/2019  . Mixed dyslipidemia 02/28/2016  . Hypertension, essential 02/28/2016  . Type 2 diabetes mellitus with hyperglycemia ([place]) 02/28/2016  . Dyspnea 04/17/2011    Past Surgical History:  Procedure Laterality Date  . CATARACT EXTRACTION W/ INTRAOCULAR LENS  IMPLANT, BILATERAL    . endometrial polyp  07/1998  . REPLACEMENT TOTAL KNEE BILATERAL  2007 & 2006  . TOTAL KNEE REVISION Right 02/06/2019   Procedure: TOTAL KNEE REVISION;  Surgeon: Paralee Cancel, MD;  Location: [facility];  Service: Orthopedics;  Laterality: Right;  90 mins     OB History   No obstetric history on file.     Family History  Problem Relation Age of Onset  . Heart failure Father   . Heart failure Mother   . Cancer Brother   . Heart failure Brother        x6    Social History   Tobacco Use  . Smoking status: Former Smoker    Packs/day: 1.00    Years: 25.00    Pack years: 25.00    Types: Cigarettes    Quit date: 04/21/2011    Years since quitting: 8.5  . Smokeless  tobacco: Never Used  Vaping Use  . Vaping Use: Never used  Substance Use Topics  . Alcohol use: Yes    Comment: rarely  . Drug use: No    Home Medications Prior to Admission medications   Medication Sig Start Date End Date Taking? Authorizing Provider  atorvastatin (LIPITOR) 80 MG tablet Take 40 mg by mouth every evening.  12/12/10   [provider]  B Complex-C (B-COMPLEX WITH VITAMIN C) tablet Take 1 tablet by mouth daily.    [provider]  Cholecalciferol (VITAMIN D3) 50 MCG (2000 UT) TABS Take 2,000 Units by mouth daily.    [provider]  escitalopram (LEXAPRO) 10 MG tablet Take 10 mg by mouth daily. 03/21/19   [provider]  HYDROcodone-acetaminophen (NORCO/VICODIN) 5-325 MG tablet Take 1-2 tablets by mouth every 4 (four) hours as needed for moderate pain (pain score 4-6). 02/08/19   Cecilie Kicks, PA-C  Multiple Vitamins-Minerals (MULTIVITAMIN WITH MINERALS) tablet Take 1 tablet by mouth daily.      [provider]  olmesartan (BENICAR) 40 MG tablet Take 40 mg by mouth daily. 09/28/18   [provider]  ONE TOUCH ULTRA TEST test  strip  12/10/10   [provider]  pioglitazone (ACTOS) 30 MG tablet Take 30 mg by mouth Daily.  12/22/10   [provider]  TRULICITY 6.28 BT/5.1VO SOPN Inject 0.75 mg into the skin every Tuesday at 6 PM. 01/10/19   [provider]    Allergies    Chantix [varenicline tartrate] and Oxytrol [oxybutynin base]  Review of Systems   Review of Systems  Musculoskeletal: Positive for back pain.  All other systems reviewed and are negative.   Physical Exam Updated Vital Signs BP (!) 161/66 (BP Location: Left Arm)   Pulse 88   Temp 98.5 F (36.9 C) (Oral)   Resp 17   Ht 5\' 3"  (1.6 m)   Wt 107.5 kg   SpO2 95%   BMI 41.98 kg/m   Physical Exam Vitals and nursing note reviewed.  Constitutional:      General: She is not in acute distress.    Appearance: Normal  appearance. She is well-developed.  HENT:     Head: Normocephalic and atraumatic.     Right Ear: Hearing normal.     Left Ear: Hearing normal.     Nose: Nose normal.  Eyes:     Conjunctiva/sclera: Conjunctivae normal.     Pupils: Pupils are equal, round, and reactive to light.  Cardiovascular:     Rate and Rhythm: Regular rhythm.     Heart sounds: S1 normal and S2 normal. No murmur heard.  No friction rub. No gallop.   Pulmonary:     Effort: Pulmonary effort is normal. No respiratory distress.     Breath sounds: Normal breath sounds.  Chest:     Chest wall: No tenderness.  Abdominal:     General: Bowel sounds are normal.     Palpations: Abdomen is soft.     Tenderness: There is no abdominal tenderness. There is no guarding or rebound. Negative signs include Murphy's sign and McBurney's sign.     Hernia: No hernia is present.  Musculoskeletal:        General: Normal range of motion.     Cervical back: Normal range of motion and neck supple.     Thoracic back: Tenderness present.       Back:  Skin:    General: Skin is warm and dry.     Findings: No rash.  Neurological:     Mental Status: She is alert and oriented to person, place, and time.     GCS: GCS eye subscore is 4. GCS verbal subscore is 5. GCS motor subscore is 6.     Cranial Nerves: No cranial nerve deficit.     Sensory: No sensory deficit.     Coordination: Coordination normal.  Psychiatric:        Speech: Speech normal.        Behavior: Behavior normal.        Thought Content: Thought content normal.     ED Results / Procedures / Treatments   Labs (all labs ordered are listed, but only abnormal results are displayed) Labs Reviewed  CBC - Abnormal; Notable for the following components:      Result Value   WBC 12.3 (*)    RBC 3.69 (*)    Hemoglobin 11.1 (*)    All other components within normal limits  BASIC METABOLIC PANEL - Abnormal; Notable for the following components:   Glucose, Bld 171 (*)     Calcium 8.7 (*)    GFR calc non Af Amer 54 (*)  All other components within normal limits    EKG None  Radiology DG Chest 1 View  Result Date: 10/25/2019 CLINICAL DATA:  Fall, chest pain EXAM: CHEST  1 VIEW COMPARISON:  None. FINDINGS: Lungs are clear. No pneumothorax or pleural effusion. Cardiac size is mildly enlarged. Pulmonary vascularity is normal. No acute bone abnormality. IMPRESSION: No active disease. Electronically Signed   By: Fidela Salisbury MD   On: 10/25/2019 01:33   DG Thoracic Spine 2 View  Addendum Date: 10/25/2019   ADDENDUM REPORT: 10/25/2019 01:33 ADDENDUM: These results were called by telephone at the time of interpretation on 10/25/2019 at 1:31 am to provider [facility] , who verbally acknowledged these results. Electronically Signed   By: Fidela Salisbury MD   On: 10/25/2019 01:33   Result Date: 10/25/2019 CLINICAL DATA:  Fall, back pain EXAM: THORACIC SPINE 2 VIEWS COMPARISON:  None. FINDINGS: There is a a transverse fracture involving the inferior endplate of T9 with mild angular deformity of the thoracic spine at this level. There is mild paravertebral stripe widening in this region likely representing edema or hemorrhage secondary to acute fracture. No associated listhesis. Degenerative changes are seen throughout the thoracic spine. Vertebral body height has been preserved. IMPRESSION: Acute fracture of the thoracic spine with angular deformity at T9. CT examination of the thoracic spine is recommended for further evaluation. Electronically Signed: By: Fidela Salisbury MD On: 10/25/2019 01:29   DG Lumbar Spine Complete  Result Date: 10/25/2019 CLINICAL DATA:  Fall, back EXAM: LUMBAR SPINE - COMPLETE 4+ VIEW COMPARISON:  None. FINDINGS: There is normal lumbar lordosis. No acute fracture or listhesis of the lumbar spine. Vertebral body height has been preserved. There is diffuse intervertebral disc space narrowing and endplate remodeling throughout the lumbar spine  in keeping with changes of mild to moderate degenerative disc disease. Oblique views demonstrate no evidence of pars defect. The paraspinal soft tissues are unremarkable. Vascular calcifications are seen within the abdominal aorta anterior to the lumbar spine. IMPRESSION: No acute fracture or listhesis.  Diffuse degenerative disc disease. Electronically Signed   By: Fidela Salisbury MD   On: 10/25/2019 01:30   CT HEAD WO CONTRAST  Result Date: 10/25/2019 CLINICAL DATA:  Slipped and fell EXAM: CT HEAD WITHOUT CONTRAST TECHNIQUE: Contiguous axial images were obtained from the base of the skull through the vertex without intravenous contrast. COMPARISON:  None. FINDINGS: Brain: No acute infarct or hemorrhage. Lateral ventricles and midline structures are unremarkable. No acute extra-axial fluid collections. No mass effect. Vascular: No hyperdense vessel or unexpected calcification. Skull: Normal. Negative for fracture or focal lesion. Sinuses/Orbits: No acute finding. Other: None. IMPRESSION: 1. No acute intracranial process. Electronically Signed   By: Randa Ngo M.D.   On: 10/25/2019 01:11   CT CERVICAL SPINE WO CONTRAST  Result Date: 10/25/2019 CLINICAL DATA:  Slipped and fell EXAM: CT CERVICAL SPINE WITHOUT CONTRAST TECHNIQUE: Multidetector CT imaging of the cervical spine was performed without intravenous contrast. Multiplanar CT image reconstructions were also generated. COMPARISON:  None. FINDINGS: Alignment: Alignment is anatomic. Skull base and vertebrae: No acute displaced cervical spine fracture. Soft tissues and spinal canal: No prevertebral fluid or swelling. No visible canal hematoma. Disc levels: There is mild diffuse cervical spondylosis, eccentric to the right. Mild C6/C7 spondylosis. Mild right neural foraminal narrowing at C4-5 and C5-6. Upper chest: Airway is patent.  Lung apices are clear. Other: Reconstructed images demonstrate no additional findings. IMPRESSION: 1. No acute cervical  spine fracture. 2. Multilevel cervical  spondylosis and facet hypertrophy. Electronically Signed   By: Randa Ngo M.D.   On: 10/25/2019 01:09   CT Thoracic Spine Wo Contrast  Result Date: 10/25/2019 CLINICAL DATA:  Traumatic spinal fracture EXAM: CT THORACIC SPINE WITHOUT CONTRAST TECHNIQUE: Multidetector CT images of the thoracic were obtained using the standard protocol without intravenous contrast. COMPARISON:  10/25/2019 thoracic spine radiograph FINDINGS: Alignment: Physiologic Vertebrae: There is an acute fracture of T9 that involves the anterior wall and superior endplate. The fracture line also traverses a right-sided osteophyte between T9 and T8. There is ankylosis of the thoracic spine due to flowing anterior enthesophytes. Paraspinal and other soft tissues: Small right pleural effusion. There are coronary artery calcifications and calcific aortic atherosclerosis. Disc levels: There is no spinal canal stenosis. IMPRESSION: 1. Mildly distracted fracture of T9 in the context of a rigid midthoracic spine. Thoracic spine MRI is recommended to assess for spinal cord injury and ligamentous damage. 2. Small right pleural effusion. Aortic Atherosclerosis (ICD10-I70.0). Electronically Signed   By: Ulyses Jarred M.D.   On: 10/25/2019 03:08    Procedures Procedures (including critical care time)  Medications Ordered in ED Medications  morphine 4 MG/ML injection 4 mg (4 mg Intravenous Given 10/25/19 0029)  ondansetron (ZOFRAN) injection 4 mg (4 mg Intravenous Given 10/25/19 0024)  morphine 4 MG/ML injection 4 mg (4 mg Intravenous Given 10/25/19 0344)    ED Course  I have reviewed the triage vital signs and the nursing notes.  Pertinent labs & imaging results that were available during my care of the patient were reviewed by me and considered in my medical decision making (see chart for details).    MDM Rules/Calculators/A&P                          Patient presents to the emergency department  for evaluation after a fall. Patient lost her balance and fell backwards off of a step, landing on her back. She did hit her head but did not lose consciousness. She is having mid back pain and headache at arrival. CT head and cervical spine did not show any acute abnormality. Thoracic spine x-ray showed a fracture of T9. Lumbar x-ray did not show any acute fracture. A CT of the thoracic spine was performed and there was concern for possible ligamentous injury, MRI was recommended. MRI is ordered but has not been performed yet. Will sign out to oncoming ER physician to follow-up on MRI.   Patient has done well here in the ER. She has required several doses of analgesia. She is somewhat comfortable at rest but I suspect that she will not be able to move very well even with analgesia. Will possibly require hospitalization for pain control even if MRI is favorable.  Final Clinical Impression(s) / ED Diagnoses Final diagnoses:  Compression fracture of T9 vertebra, initial encounter [facility])    Rx / DC Orders ED Discharge Orders    None       Orpah Greek, MD 10/25/19 [phone]

## 2019-10-25 NOTE — ED Notes (Signed)
Ortho tech paged for TLSO brace

## 2019-10-25 NOTE — Consult Note (Addendum)
Reason for Consult: T9 fracture Referring Physician: EDP  Breanna Shields is an 79 y.o. female.   HPI:  79 year old female presents to the ED last night around 11pm after sustaining a fall down the stairs when she was carrying groceries upstairs. She fell backwards on her back. Since then she has had back pain but no leg pain. Denies any NT or weakness in her legs. Rates her pain a 10/10 with any movement.   Past Medical History:  Diagnosis Date  . Allergy   . Diabetes mellitus    type ii  . Hyperlipidemia   . Hypertension     Past Surgical History:  Procedure Laterality Date  . CATARACT EXTRACTION W/ INTRAOCULAR LENS  IMPLANT, BILATERAL    . endometrial polyp  07/1998  . REPLACEMENT TOTAL KNEE BILATERAL  2007 & 2006  . TOTAL KNEE REVISION Right 02/06/2019   Procedure: TOTAL KNEE REVISION;  Surgeon: Paralee Cancel, MD;  Location: [facility] OR[facility];  Service: Orthopedics;  Laterality: Right;  90 mins    Allergies  Allergen Reactions  . Chantix [Varenicline Tartrate] Nausea Only and Other (See Comments)    Dizziness  . Oxytrol [Oxybutynin Base] Other (See Comments)    Dry mouth     Social History   Tobacco Use  . Smoking status: Former Smoker    Packs/day: 1.00    Years: 25.00    Pack years: 25.00    Types: Cigarettes    Quit date: 04/21/2011    Years since quitting: 8.5  . Smokeless tobacco: Never Used  Substance Use Topics  . Alcohol use: Yes    Comment: rarely    Family History  Problem Relation Age of Onset  . Heart failure Father   . Heart failure Mother   . Cancer Brother   . Heart failure Brother        x6     Review of Systems  Positive ROS: as above  All other systems have been reviewed and were otherwise negative with the exception of those mentioned in the HPI and as above.  Objective: Vital signs in last 24 hours: Temp:  [98.5 F (36.9 C)] 98.5 F (36.9 C) (09/17 2341) Pulse Rate:  [71-88] 79 (09/18 1345) Resp:  [17] 17 (09/18 0623) BP:  (123-165)/(52-144) 136/63 (09/18 1345) SpO2:  [85 %-100 %] 97 % (09/18 1345) Weight:  [107.5 kg] 107.5 kg (09/17 2349)  General Appearance: Alert, cooperative, no distress, appears stated age Head: Normocephalic, without obvious abnormality, atraumatic Eyes: PERRL, conjunctiva/corneas clear, EOM's intact, fundi benign, both eyes      Lungs: respirations unlabored Heart: Regular rate and rhythm NEUROLOGIC:   Mental status: A&O x4, no aphasia, good attention span, Memory and fund of knowledge Motor Exam - grossly normal, normal tone and bulk Sensory Exam - grossly normal Reflexes: symmetric, no pathologic reflexes, No Hoffman's, No clonus Coordination - grossly normal Gait - not tested Balance - not tested Cranial Nerves: I: smell Not tested  II: visual acuity  OS: na    OD: na  II: visual fields Full to confrontation  II: pupils Equal, round, reactive to light  III,VII: ptosis None  III,IV,VI: extraocular muscles  Full ROM  V: mastication   V: facial light touch sensation    V,VII: corneal reflex    VII: facial muscle function - upper    VII: facial muscle function - lower   VIII: hearing   IX: soft palate elevation    IX,X: gag reflex  XI: trapezius strength    XI: sternocleidomastoid strength   XI: neck flexion strength    XII: tongue strength      Data Review Lab Results  Component Value Date   WBC 12.3 (H) 10/25/2019   HGB 11.1 (L) 10/25/2019   HCT 36.7 10/25/2019   MCV 99.5 10/25/2019   PLT 251 10/25/2019   Lab Results  Component Value Date   NA 139 10/25/2019   K 4.2 10/25/2019   CL 104 10/25/2019   CO2 26 10/25/2019   BUN 23 10/25/2019   CREATININE 1.00 10/25/2019   GLUCOSE 171 (H) 10/25/2019   No results found for: INR, PROTIME  Radiology: DG Chest 1 View  Result Date: 10/25/2019 CLINICAL DATA:  Fall, chest pain EXAM: CHEST  1 VIEW COMPARISON:  None. FINDINGS: Lungs are clear. No pneumothorax or pleural effusion. Cardiac size is mildly enlarged.  Pulmonary vascularity is normal. No acute bone abnormality. IMPRESSION: No active disease. Electronically Signed   By: Fidela Salisbury MD   On: 10/25/2019 01:33   DG Thoracic Spine 2 View  Addendum Date: 10/25/2019   ADDENDUM REPORT: 10/25/2019 01:33 ADDENDUM: These results were called by telephone at the time of interpretation on 10/25/2019 at 1:31 am to provider [facility] , who verbally acknowledged these results. Electronically Signed   By: Fidela Salisbury MD   On: 10/25/2019 01:33   Result Date: 10/25/2019 CLINICAL DATA:  Fall, back pain EXAM: THORACIC SPINE 2 VIEWS COMPARISON:  None. FINDINGS: There is a a transverse fracture involving the inferior endplate of T9 with mild angular deformity of the thoracic spine at this level. There is mild paravertebral stripe widening in this region likely representing edema or hemorrhage secondary to acute fracture. No associated listhesis. Degenerative changes are seen throughout the thoracic spine. Vertebral body height has been preserved. IMPRESSION: Acute fracture of the thoracic spine with angular deformity at T9. CT examination of the thoracic spine is recommended for further evaluation. Electronically Signed: By: Fidela Salisbury MD On: 10/25/2019 01:29   DG Lumbar Spine Complete  Result Date: 10/25/2019 CLINICAL DATA:  Fall, back EXAM: LUMBAR SPINE - COMPLETE 4+ VIEW COMPARISON:  None. FINDINGS: There is normal lumbar lordosis. No acute fracture or listhesis of the lumbar spine. Vertebral body height has been preserved. There is diffuse intervertebral disc space narrowing and endplate remodeling throughout the lumbar spine in keeping with changes of mild to moderate degenerative disc disease. Oblique views demonstrate no evidence of pars defect. The paraspinal soft tissues are unremarkable. Vascular calcifications are seen within the abdominal aorta anterior to the lumbar spine. IMPRESSION: No acute fracture or listhesis.  Diffuse degenerative disc  disease. Electronically Signed   By: Fidela Salisbury MD   On: 10/25/2019 01:30   CT HEAD WO CONTRAST  Result Date: 10/25/2019 CLINICAL DATA:  Slipped and fell EXAM: CT HEAD WITHOUT CONTRAST TECHNIQUE: Contiguous axial images were obtained from the base of the skull through the vertex without intravenous contrast. COMPARISON:  None. FINDINGS: Brain: No acute infarct or hemorrhage. Lateral ventricles and midline structures are unremarkable. No acute extra-axial fluid collections. No mass effect. Vascular: No hyperdense vessel or unexpected calcification. Skull: Normal. Negative for fracture or focal lesion. Sinuses/Orbits: No acute finding. Other: None. IMPRESSION: 1. No acute intracranial process. Electronically Signed   By: Randa Ngo M.D.   On: 10/25/2019 01:11   CT CERVICAL SPINE WO CONTRAST  Result Date: 10/25/2019 CLINICAL DATA:  Slipped and fell EXAM: CT CERVICAL SPINE WITHOUT CONTRAST TECHNIQUE:  Multidetector CT imaging of the cervical spine was performed without intravenous contrast. Multiplanar CT image reconstructions were also generated. COMPARISON:  None. FINDINGS: Alignment: Alignment is anatomic. Skull base and vertebrae: No acute displaced cervical spine fracture. Soft tissues and spinal canal: No prevertebral fluid or swelling. No visible canal hematoma. Disc levels: There is mild diffuse cervical spondylosis, eccentric to the right. Mild C6/C7 spondylosis. Mild right neural foraminal narrowing at C4-5 and C5-6. Upper chest: Airway is patent.  Lung apices are clear. Other: Reconstructed images demonstrate no additional findings. IMPRESSION: 1. No acute cervical spine fracture. 2. Multilevel cervical spondylosis and facet hypertrophy. Electronically Signed   By: Randa Ngo M.D.   On: 10/25/2019 01:09   CT Thoracic Spine Wo Contrast  Result Date: 10/25/2019 CLINICAL DATA:  Traumatic spinal fracture EXAM: CT THORACIC SPINE WITHOUT CONTRAST TECHNIQUE: Multidetector CT images of the  thoracic were obtained using the standard protocol without intravenous contrast. COMPARISON:  10/25/2019 thoracic spine radiograph FINDINGS: Alignment: Physiologic Vertebrae: There is an acute fracture of T9 that involves the anterior wall and superior endplate. The fracture line also traverses a right-sided osteophyte between T9 and T8. There is ankylosis of the thoracic spine due to flowing anterior enthesophytes. Paraspinal and other soft tissues: Small right pleural effusion. There are coronary artery calcifications and calcific aortic atherosclerosis. Disc levels: There is no spinal canal stenosis. IMPRESSION: 1. Mildly distracted fracture of T9 in the context of a rigid midthoracic spine. Thoracic spine MRI is recommended to assess for spinal cord injury and ligamentous damage. 2. Small right pleural effusion. Aortic Atherosclerosis (ICD10-I70.0). Electronically Signed   By: Ulyses Jarred M.D.   On: 10/25/2019 03:08   MR THORACIC SPINE WO CONTRAST  Result Date: 10/25/2019 CLINICAL DATA:  Fall.  Traumatic spinal fracture. EXAM: MRI THORACIC SPINE WITHOUT CONTRAST TECHNIQUE: Multiplanar, multisequence MR imaging of the thoracic spine was performed. No intravenous contrast was administered. COMPARISON:  CT thoracic spine 10/25/2019 FINDINGS: Alignment: Mild anterolisthesis T1-2 and T2-3. Mild to moderate dorsal kyphosis. Vertebrae: Fracture T9 vertebral body. Well-defined fracture line is seen in the vertebral body extending to the inferior endplate. Improved alignment compared with the CT when there was distraction of the inferior portion of the fracture. Surprisingly, there is minimal bone marrow edema around the fracture. The fracture appears acute on CT. No other fracture identified. No epidural hematoma. Negative for mass lesion. Cord:  Normal signal and morphology.  No cord compression Paraspinal and other soft tissues: Small right pleural effusion. No paraspinous hematoma. Disc levels: Multilevel disc  degeneration throughout the thoracic spine. Small right-sided disc protrusion at T6-7. Small left-sided disc protrusion and spurring at T7-8. Small central disc protrusion and spurring at T8-9. Small central disc protrusion T2-10 11 and T11-12. IMPRESSION: Fracture T9 vertebral body. This fracture shows improved alignment compared with CT earlier today with decreased distraction. The fracture appears acute on CT. Surprisingly there is minimal bone marrow edema. No epidural hematoma or spinal stenosis. Electronically Signed   By: Franchot Gallo M.D.   On: 10/25/2019 12:15     Assessment/Plan: 79 year old female presented to the ED last night after falling down the stairs. CT thoracic spine shows a T9 fracture that extends from the anterior part of the vertebral body up to the superior endplate. She does have DISH and no posterior element involvement. MRI t-spine stable with no retropulsion into the canal or spinal stenosis. She is neurologically intact so we are going to keep her flat bedrest with log roll  only until we stabilize this tomorrow with surgery. We spoke with the daughter and explained all of this in detail. She and the patient understood and agreed to move forwards. We discussed the risks and benefits associated with the surgery. Will plan on a thoracic fusion from T7-T11 tomorrow. NPO after midnight.   Breanna Shields 10/25/2019 2:31 PM I have seen and examined the patient. I have reviewed the films, and agree with NP Shields and her assessment. Plan is the OR tomorrow for thoracic arthrodesis and stabilization.

## 2019-10-25 NOTE — ED Notes (Signed)
Ortho at bedside.

## 2019-10-25 NOTE — ED Notes (Signed)
Neuro surgery at bedside.

## 2019-10-25 NOTE — ED Provider Notes (Signed)
Patient signed out to me at 7 AM awaiting MRI to further evaluate for thoracic vertebra nine fracture. Patient with history of diabetes, high cholesterol, hypertension. Mechanical fall while going up stairs. CT of head and neck are unremarkable. CT of thoracic spine shows distracted fracture of T9. MRI was recommended by radiology. She is neurologically intact. Normal sensation and strength throughout. Has had multiple doses of IV morphine with improvement but still with significant pain with minimal movement in bed and will likely require admission for pain control and physical therapy. Will await MRI and touch base with neurosurgery. Anticipate admission.  MRI shows T9 vertebral body fracture. However no obvious ligamentous injuries or marrow edema. Talked with neurosurgery who will evaluate the patient but recommend TLSO brace at this time. Patient feeling better after IV morphine. Will admit to the hospitalist for further care for pain management, PT OT.  This chart was dictated using voice recognition software.  Despite best efforts to proofread,  errors can occur which can change the documentation meaning.     Lennice Sites, DO 10/25/19 1344

## 2019-10-26 ENCOUNTER — Encounter (HOSPITAL_COMMUNITY): Payer: Self-pay | Admitting: Internal Medicine

## 2019-10-26 ENCOUNTER — Inpatient Hospital Stay (HOSPITAL_COMMUNITY): Payer: HMO | Admitting: Certified Registered"

## 2019-10-26 ENCOUNTER — Inpatient Hospital Stay (HOSPITAL_COMMUNITY): Payer: HMO

## 2019-10-26 ENCOUNTER — Encounter (HOSPITAL_COMMUNITY)
Admission: EM | Disposition: A | Discharge status: Skilled Nursing Facility | Payer: Self-pay | Source: Home / Self Care | Attending: Internal Medicine

## 2019-10-26 DIAGNOSIS — S22009A Unspecified fracture of unspecified thoracic vertebra, initial encounter for closed fracture: Secondary | ICD-10-CM | POA: Diagnosis present

## 2019-10-26 DIAGNOSIS — I10 Essential (primary) hypertension: Secondary | ICD-10-CM

## 2019-10-26 DIAGNOSIS — E119 Type 2 diabetes mellitus without complications: Secondary | ICD-10-CM

## 2019-10-26 DIAGNOSIS — D72829 Elevated white blood cell count, unspecified: Secondary | ICD-10-CM

## 2019-10-26 HISTORY — PX: POSTERIOR LUMBAR FUSION 4 LEVEL: SHX6037

## 2019-10-26 LAB — COMPREHENSIVE METABOLIC PANEL
ALT: 20 U/L (ref 0–44)
AST: 17 U/L (ref 15–41)
Albumin: 3 g/dL — ABNORMAL LOW (ref 3.5–5.0)
Alkaline Phosphatase: 68 U/L (ref 38–126)
Anion gap: 8 (ref 5–15)
BUN: 13 mg/dL (ref 8–23)
CO2: 27 mmol/L (ref 22–32)
Calcium: 8.9 mg/dL (ref 8.9–10.3)
Chloride: 104 mmol/L (ref 98–111)
Creatinine, Ser: 0.74 mg/dL (ref 0.44–1.00)
GFR calc Af Amer: >60 mL/min (ref >60)
GFR calc non Af Amer: >60 mL/min (ref >60)
Glucose, Bld: 167 mg/dL — ABNORMAL HIGH (ref 70–99)
Potassium: 3.9 mmol/L (ref 3.5–5.1)
Sodium: 139 mmol/L (ref 135–145)
Total Bilirubin: 0.6 mg/dL (ref 0.3–1.2)
Total Protein: 6.3 g/dL — ABNORMAL LOW (ref 6.5–8.1)

## 2019-10-26 LAB — URINALYSIS, ROUTINE W REFLEX MICROSCOPIC
Bilirubin Urine: NEGATIVE
Glucose, UA: NEGATIVE mg/dL
Ketones, ur: NEGATIVE mg/dL
Nitrite: POSITIVE — AB
Protein, ur: NEGATIVE mg/dL
Specific Gravity, Urine: 1.015 (ref 1.005–1.030)
pH: 5 (ref 5.0–8.0)

## 2019-10-26 LAB — CBC
HCT: 36.5 % (ref 36.0–46.0)
Hemoglobin: 11.3 g/dL — ABNORMAL LOW (ref 12.0–15.0)
MCH: 30.3 pg (ref 26.0–34.0)
MCHC: 31 g/dL (ref 30.0–36.0)
MCV: 97.9 fL (ref 80.0–100.0)
Platelets: 230 10*3/uL (ref 150–400)
RBC: 3.73 MIL/uL — ABNORMAL LOW (ref 3.87–5.11)
RDW: 13.3 % (ref 11.5–15.5)
WBC: 10.7 10*3/uL — ABNORMAL HIGH (ref 4.0–10.5)
nRBC: 0 % (ref 0.0–0.2)

## 2019-10-26 LAB — GLUCOSE, CAPILLARY
Glucose-Capillary: 160 mg/dL — ABNORMAL HIGH (ref 70–99)
Glucose-Capillary: 136 mg/dL — ABNORMAL HIGH (ref 70–99)
Glucose-Capillary: 234 mg/dL — ABNORMAL HIGH (ref 70–99)
Glucose-Capillary: 276 mg/dL — ABNORMAL HIGH (ref 70–99)
Glucose-Capillary: 275 mg/dL — ABNORMAL HIGH (ref 70–99)

## 2019-10-26 SURGERY — POSTERIOR LUMBAR FUSION 4 LEVEL
Anesthesia: General | Site: Spine Thoracic

## 2019-10-26 MED ORDER — VITAMIN D 25 MCG (1000 UNIT) PO TABS
2000.0000 [IU] | ORAL_TABLET | Freq: Every day | ORAL | Status: DC
  Administered 2019-10-27 – 2019-11-04 (×9): 2000 [IU] via ORAL
  Filled 2019-10-26 (×9): qty 2

## 2019-10-26 MED ORDER — SODIUM CHLORIDE 0.9 % IV SOLN: 250.0000 mL | INTRAVENOUS | Status: DC

## 2019-10-26 MED ORDER — PHENOL 1.4 % MT LIQD: 1.0000 | OROMUCOSAL | Status: DC | PRN

## 2019-10-26 MED ORDER — THROMBIN 20000 UNITS EX SOLR: CUTANEOUS | Status: DC | PRN

## 2019-10-26 MED ORDER — ROCURONIUM BROMIDE 10 MG/ML (PF) SYRINGE
PREFILLED_SYRINGE | INTRAVENOUS | Status: AC
  Filled 2019-10-26: qty 10

## 2019-10-26 MED ORDER — 0.9 % SODIUM CHLORIDE (POUR BTL) OPTIME
TOPICAL | Status: DC | PRN
  Administered 2019-10-26: 1000 mL

## 2019-10-26 MED ORDER — LACTATED RINGERS IV SOLN: INTRAVENOUS | Status: DC

## 2019-10-26 MED ORDER — SENNOSIDES-DOCUSATE SODIUM 8.6-50 MG PO TABS: 1.0000 | ORAL_TABLET | Freq: Every evening | ORAL | Status: DC | PRN

## 2019-10-26 MED ORDER — HYDROCODONE-ACETAMINOPHEN 7.5-325 MG PO TABS
1.0000 | ORAL_TABLET | ORAL | Status: DC | PRN
  Administered 2019-10-27 – 2019-10-28 (×3): 2 via ORAL
  Administered 2019-10-29: 1 via ORAL
  Administered 2019-10-29 – 2019-10-31 (×3): 2 via ORAL
  Administered 2019-10-31 – 2019-11-02 (×5): 1 via ORAL
  Filled 2019-10-26 (×2): qty 1
  Filled 2019-10-26 (×4): qty 2
  Filled 2019-10-26 (×4): qty 1
  Filled 2019-10-26: qty 2
  Filled 2019-10-26: qty 1

## 2019-10-26 MED ORDER — ZOLPIDEM TARTRATE 5 MG PO TABS: 5.0000 mg | ORAL_TABLET | Freq: Every evening | ORAL | Status: DC | PRN

## 2019-10-26 MED ORDER — EPHEDRINE 5 MG/ML INJ
INTRAVENOUS | Status: AC
  Filled 2019-10-26: qty 10

## 2019-10-26 MED ORDER — FENTANYL CITRATE (PF) 250 MCG/5ML IJ SOLN
INTRAMUSCULAR | Status: AC
  Filled 2019-10-26: qty 5

## 2019-10-26 MED ORDER — ORAL CARE MOUTH RINSE: 15.0000 mL | Freq: Once | OROMUCOSAL | Status: AC

## 2019-10-26 MED ORDER — DEXAMETHASONE SODIUM PHOSPHATE 10 MG/ML IJ SOLN
INTRAMUSCULAR | Status: DC | PRN
  Administered 2019-10-26: 10 mg via INTRAVENOUS

## 2019-10-26 MED ORDER — OXYCODONE HCL 5 MG/5ML PO SOLN: 5.0000 mg | Freq: Once | ORAL | Status: DC | PRN

## 2019-10-26 MED ORDER — SODIUM CHLORIDE 0.9% FLUSH
3.0000 mL | INTRAVENOUS | Status: DC | PRN
  Administered 2019-10-30: 3 mL via INTRAVENOUS

## 2019-10-26 MED ORDER — PROPOFOL 10 MG/ML IV BOLUS
INTRAVENOUS | Status: DC | PRN
  Administered 2019-10-26: 130 mg via INTRAVENOUS

## 2019-10-26 MED ORDER — SUCCINYLCHOLINE CHLORIDE 200 MG/10ML IV SOSY
PREFILLED_SYRINGE | INTRAVENOUS | Status: AC
  Filled 2019-10-26: qty 10

## 2019-10-26 MED ORDER — HYDRALAZINE HCL 20 MG/ML IJ SOLN
INTRAMUSCULAR | Status: AC
  Filled 2019-10-26: qty 1

## 2019-10-26 MED ORDER — POTASSIUM CHLORIDE IN NACL 20-0.9 MEQ/L-% IV SOLN
INTRAVENOUS | Status: DC
  Filled 2019-10-26 (×4): qty 1000

## 2019-10-26 MED ORDER — ONDANSETRON HCL 4 MG/2ML IJ SOLN
4.0000 mg | Freq: Once | INTRAMUSCULAR | Status: AC | PRN
  Administered 2019-10-26: 4 mg via INTRAVENOUS

## 2019-10-26 MED ORDER — SUGAMMADEX SODIUM 200 MG/2ML IV SOLN
INTRAVENOUS | Status: DC | PRN
  Administered 2019-10-26: 200 mg via INTRAVENOUS

## 2019-10-26 MED ORDER — INSULIN ASPART 100 UNIT/ML ~~LOC~~ SOLN
SUBCUTANEOUS | Status: AC
  Filled 2019-10-26: qty 1

## 2019-10-26 MED ORDER — ADULT MULTIVITAMIN W/MINERALS CH
1.0000 | ORAL_TABLET | Freq: Every day | ORAL | Status: DC
  Administered 2019-10-27 – 2019-11-04 (×9): 1 via ORAL
  Filled 2019-10-26 (×10): qty 1

## 2019-10-26 MED ORDER — SODIUM CHLORIDE 0.9 % IV SOLN
INTRAVENOUS | Status: AC
  Filled 2019-10-26: qty 20

## 2019-10-26 MED ORDER — ESCITALOPRAM OXALATE 10 MG PO TABS
10.0000 mg | ORAL_TABLET | Freq: Every day | ORAL | Status: DC
  Administered 2019-10-27 – 2019-11-04 (×9): 10 mg via ORAL
  Filled 2019-10-26 (×10): qty 1

## 2019-10-26 MED ORDER — MENTHOL 3 MG MT LOZG: 1.0000 | LOZENGE | OROMUCOSAL | Status: DC | PRN

## 2019-10-26 MED ORDER — ONDANSETRON HCL 4 MG/2ML IJ SOLN
INTRAMUSCULAR | Status: AC
  Filled 2019-10-26: qty 2

## 2019-10-26 MED ORDER — OXYCODONE HCL 5 MG PO TABS: 5.0000 mg | ORAL_TABLET | Freq: Once | ORAL | Status: DC | PRN

## 2019-10-26 MED ORDER — ONDANSETRON HCL 4 MG PO TABS
4.0000 mg | ORAL_TABLET | Freq: Four times a day (QID) | ORAL | Status: DC | PRN
  Administered 2019-10-28: 4 mg via ORAL
  Filled 2019-10-26: qty 1

## 2019-10-26 MED ORDER — FENTANYL CITRATE (PF) 250 MCG/5ML IJ SOLN
INTRAMUSCULAR | Status: DC | PRN
Start: 2019-10-26 — End: 2019-10-26
  Administered 2019-10-26 (×3): 50 ug via INTRAVENOUS
  Administered 2019-10-26: 100 ug via INTRAVENOUS

## 2019-10-26 MED ORDER — CHLORHEXIDINE GLUCONATE 0.12 % MT SOLN: 15.0000 mL | Freq: Once | OROMUCOSAL | Status: AC

## 2019-10-26 MED ORDER — MECLIZINE HCL 12.5 MG PO TABS
25.0000 mg | ORAL_TABLET | Freq: Four times a day (QID) | ORAL | Status: DC | PRN
  Administered 2019-10-28 (×2): 25 mg via ORAL
  Filled 2019-10-26 (×2): qty 2

## 2019-10-26 MED ORDER — ONDANSETRON HCL 4 MG/2ML IJ SOLN
INTRAMUSCULAR | Status: DC | PRN
  Administered 2019-10-26: 4 mg via INTRAVENOUS

## 2019-10-26 MED ORDER — ONDANSETRON HCL 4 MG/2ML IJ SOLN
4.0000 mg | Freq: Four times a day (QID) | INTRAMUSCULAR | Status: DC | PRN
  Administered 2019-10-27 – 2019-10-30 (×2): 4 mg via INTRAVENOUS
  Filled 2019-10-26 (×2): qty 2

## 2019-10-26 MED ORDER — OXYCODONE HCL 5 MG PO TABS: 5.0000 mg | ORAL_TABLET | ORAL | Status: DC | PRN

## 2019-10-26 MED ORDER — LIDOCAINE 2% (20 MG/ML) 5 ML SYRINGE
INTRAMUSCULAR | Status: AC
  Filled 2019-10-26: qty 5

## 2019-10-26 MED ORDER — LIDOCAINE-EPINEPHRINE 0.5 %-1:200000 IJ SOLN
INTRAMUSCULAR | Status: DC | PRN
  Administered 2019-10-26: 22 mL

## 2019-10-26 MED ORDER — DIAZEPAM 5 MG PO TABS: 5.0000 mg | ORAL_TABLET | Freq: Four times a day (QID) | ORAL | Status: DC | PRN

## 2019-10-26 MED ORDER — BISACODYL 5 MG PO TBEC: 5.0000 mg | DELAYED_RELEASE_TABLET | Freq: Every day | ORAL | Status: DC | PRN

## 2019-10-26 MED ORDER — PROPOFOL 10 MG/ML IV BOLUS
INTRAVENOUS | Status: AC
  Filled 2019-10-26: qty 20

## 2019-10-26 MED ORDER — ROCURONIUM BROMIDE 10 MG/ML (PF) SYRINGE
PREFILLED_SYRINGE | INTRAVENOUS | Status: DC | PRN
  Administered 2019-10-26: 100 mg via INTRAVENOUS

## 2019-10-26 MED ORDER — SODIUM CHLORIDE 0.9 % IV SOLN
1.0000 g | INTRAVENOUS | Status: AC
  Administered 2019-10-26 – 2019-10-27 (×2): 1 g via INTRAVENOUS
  Filled 2019-10-26: qty 10

## 2019-10-26 MED ORDER — CHLORHEXIDINE GLUCONATE CLOTH 2 % EX PADS
6.0000 | MEDICATED_PAD | Freq: Every day | CUTANEOUS | Status: DC
  Administered 2019-10-26 – 2019-11-04 (×9): 6 via TOPICAL

## 2019-10-26 MED ORDER — DEXAMETHASONE SODIUM PHOSPHATE 10 MG/ML IJ SOLN
INTRAMUSCULAR | Status: AC
  Filled 2019-10-26: qty 1

## 2019-10-26 MED ORDER — MAGNESIUM CITRATE PO SOLN
1.0000 | Freq: Once | ORAL | Status: AC | PRN
  Administered 2019-10-31: 1 via ORAL
  Filled 2019-10-26: qty 296

## 2019-10-26 MED ORDER — PIOGLITAZONE HCL 30 MG PO TABS
30.0000 mg | ORAL_TABLET | Freq: Every day | ORAL | Status: DC
  Administered 2019-10-27 – 2019-11-04 (×7): 30 mg via ORAL
  Filled 2019-10-26 (×10): qty 1

## 2019-10-26 MED ORDER — FENTANYL CITRATE (PF) 100 MCG/2ML IJ SOLN
25.0000 ug | INTRAMUSCULAR | Status: DC | PRN
  Administered 2019-10-26: 25 ug via INTRAVENOUS

## 2019-10-26 MED ORDER — CELECOXIB 200 MG PO CAPS
200.0000 mg | ORAL_CAPSULE | Freq: Two times a day (BID) | ORAL | Status: DC
  Administered 2019-10-26 – 2019-11-04 (×17): 200 mg via ORAL
  Filled 2019-10-26 (×17): qty 1

## 2019-10-26 MED ORDER — FENTANYL CITRATE (PF) 100 MCG/2ML IJ SOLN
INTRAMUSCULAR | Status: AC
  Filled 2019-10-26: qty 2

## 2019-10-26 MED ORDER — SUCCINYLCHOLINE CHLORIDE 200 MG/10ML IV SOSY
PREFILLED_SYRINGE | INTRAVENOUS | Status: DC | PRN
  Administered 2019-10-26: 120 mg via INTRAVENOUS

## 2019-10-26 MED ORDER — SODIUM CHLORIDE 0.9% FLUSH
3.0000 mL | Freq: Two times a day (BID) | INTRAVENOUS | Status: DC
  Administered 2019-10-26 – 2019-11-04 (×18): 3 mL via INTRAVENOUS

## 2019-10-26 MED ORDER — CHLORHEXIDINE GLUCONATE 0.12 % MT SOLN
OROMUCOSAL | Status: AC
  Administered 2019-10-26: 15 mL via OROMUCOSAL
  Filled 2019-10-26: qty 15

## 2019-10-26 MED ORDER — PHENYLEPHRINE HCL-NACL 10-0.9 MG/250ML-% IV SOLN
INTRAVENOUS | Status: DC | PRN
  Administered 2019-10-26: 50 ug/min via INTRAVENOUS

## 2019-10-26 MED ORDER — IRBESARTAN 300 MG PO TABS
300.0000 mg | ORAL_TABLET | Freq: Every day | ORAL | Status: DC
  Administered 2019-10-27 – 2019-10-29 (×3): 300 mg via ORAL
  Filled 2019-10-26 (×5): qty 1

## 2019-10-26 MED ORDER — MORPHINE SULFATE (PF) 2 MG/ML IV SOLN
1.0000 mg | INTRAVENOUS | Status: DC | PRN
  Administered 2019-10-26 – 2019-10-27 (×3): 1 mg via INTRAVENOUS
  Filled 2019-10-26 (×3): qty 1

## 2019-10-26 MED ORDER — THROMBIN 20000 UNITS EX SOLR
CUTANEOUS | Status: AC
  Filled 2019-10-26: qty 20000

## 2019-10-26 MED ORDER — LIDOCAINE 2% (20 MG/ML) 5 ML SYRINGE
INTRAMUSCULAR | Status: DC | PRN
  Administered 2019-10-26: 60 mg via INTRAVENOUS

## 2019-10-26 MED ORDER — LIDOCAINE-EPINEPHRINE 0.5 %-1:200000 IJ SOLN
INTRAMUSCULAR | Status: AC
  Filled 2019-10-26: qty 1

## 2019-10-26 MED ORDER — BUPIVACAINE HCL (PF) 0.5 % IJ SOLN
INTRAMUSCULAR | Status: AC
  Filled 2019-10-26: qty 30

## 2019-10-26 MED ORDER — SENNA 8.6 MG PO TABS
1.0000 | ORAL_TABLET | Freq: Two times a day (BID) | ORAL | Status: DC
  Administered 2019-10-26 – 2019-11-04 (×16): 8.6 mg via ORAL
  Filled 2019-10-26 (×17): qty 1

## 2019-10-26 MED ORDER — HEPARIN SODIUM (PORCINE) 5000 UNIT/ML IJ SOLN
5000.0000 [IU] | Freq: Three times a day (TID) | INTRAMUSCULAR | Status: DC
  Administered 2019-10-26 – 2019-10-30 (×10): 5000 [IU] via SUBCUTANEOUS
  Filled 2019-10-26 (×11): qty 1

## 2019-10-26 MED ORDER — MIDAZOLAM HCL 2 MG/2ML IJ SOLN
INTRAMUSCULAR | Status: AC
  Filled 2019-10-26: qty 2

## 2019-10-26 MED ORDER — B COMPLEX-C PO TABS
1.0000 | ORAL_TABLET | Freq: Every day | ORAL | Status: DC
  Administered 2019-10-27 – 2019-11-04 (×9): 1 via ORAL
  Filled 2019-10-26 (×10): qty 1

## 2019-10-26 MED ORDER — PHENYLEPHRINE 40 MCG/ML (10ML) SYRINGE FOR IV PUSH (FOR BLOOD PRESSURE SUPPORT)
PREFILLED_SYRINGE | INTRAVENOUS | Status: AC
  Filled 2019-10-26: qty 10

## 2019-10-26 SURGICAL SUPPLY — 54 items
ADH SKN CLS APL DERMABOND .7 (GAUZE/BANDAGES/DRESSINGS) ×1
BUR MATCHSTICK NEURO 3.0 LAGG (BURR) ×2 IMPLANT
BUR PRECISION FLUTE 5.0 (BURR) ×2 IMPLANT
CANISTER SUCT 3000ML PPV (MISCELLANEOUS) ×2 IMPLANT
CARTRIDGE OIL MAESTRO DRILL (MISCELLANEOUS) ×1 IMPLANT
CNTNR URN SCR LID CUP LEK RST (MISCELLANEOUS) ×1 IMPLANT
CONT SPEC 4OZ STRL OR WHT (MISCELLANEOUS) ×2
COVER BACK TABLE 60X90IN (DRAPES) ×1 IMPLANT
DERMABOND ADVANCED (GAUZE/BANDAGES/DRESSINGS) ×1
DERMABOND ADVANCED .7 DNX12 (GAUZE/BANDAGES/DRESSINGS) ×1 IMPLANT
DIFFUSER DRILL AIR PNEUMATIC (MISCELLANEOUS) ×2 IMPLANT
DRAPE C-ARM 42X72 X-RAY (DRAPES) ×4 IMPLANT
DRAPE C-ARMOR (DRAPES) ×1 IMPLANT
DRAPE LAPAROTOMY 100X72X124 (DRAPES) ×2 IMPLANT
DRSG OPSITE POSTOP 4X12 (GAUZE/BANDAGES/DRESSINGS) ×1 IMPLANT
DURAPREP 26ML APPLICATOR (WOUND CARE) ×2 IMPLANT
ELECT REM PT RETURN 9FT ADLT (ELECTROSURGICAL) ×2
ELECTRODE REM PT RTRN 9FT ADLT (ELECTROSURGICAL) ×1 IMPLANT
GLOVE ECLIPSE 6.5 STRL STRAW (GLOVE) ×4 IMPLANT
GLOVE ECLIPSE 7.5 STRL STRAW (GLOVE) ×3 IMPLANT
GLOVE SKINSENSE NS SZ7.0 (GLOVE) ×3
GLOVE SKINSENSE STRL SZ7.0 (GLOVE) IMPLANT
GLOVE SS N UNI LF 8.0 STRL (GLOVE) ×3 IMPLANT
GOWN STRL REUS W/ TWL LRG LVL3 (GOWN DISPOSABLE) ×2 IMPLANT
GOWN STRL REUS W/ TWL XL LVL3 (GOWN DISPOSABLE) IMPLANT
GOWN STRL REUS W/TWL 2XL LVL3 (GOWN DISPOSABLE) ×1 IMPLANT
GOWN STRL REUS W/TWL LRG LVL3 (GOWN DISPOSABLE) ×4
GOWN STRL REUS W/TWL XL LVL3 (GOWN DISPOSABLE) ×4
GUIDEWIRE NITINOL BEVEL TIP (WIRE) ×8 IMPLANT
KIT BASIN OR (CUSTOM PROCEDURE TRAY) ×2 IMPLANT
KIT POSITION SURG JACKSON T1 (MISCELLANEOUS) ×2 IMPLANT
KIT TURNOVER KIT B (KITS) ×2 IMPLANT
NDL HYPO 25X1 1.5 SAFETY (NEEDLE) ×1 IMPLANT
NDL I PASS (NEEDLE) IMPLANT
NDL SPNL 18GX3.5 QUINCKE PK (NEEDLE) IMPLANT
NEEDLE HYPO 25X1 1.5 SAFETY (NEEDLE) ×2 IMPLANT
NEEDLE I PASS (NEEDLE) ×2 IMPLANT
NEEDLE SPNL 18GX3.5 QUINCKE PK (NEEDLE) ×2 IMPLANT
NS IRRIG 1000ML POUR BTL (IV SOLUTION) ×2 IMPLANT
OIL CARTRIDGE MAESTRO DRILL (MISCELLANEOUS) ×2
PACK LAMINECTOMY NEURO (CUSTOM PROCEDURE TRAY) ×2 IMPLANT
ROD PRECEPT TI 130MM LORD PB (Rod) ×1 IMPLANT
ROD RELINE MAS LORD 5.5X120MM (Rod) ×1 IMPLANT
SCREW LOCK RELINE 5.5 TULIP (Screw) ×8 IMPLANT
SCREW MAS RELINE POLY 6.5X40 (Screw) ×4 IMPLANT
SCREW RELINE MAS POLY 5.5X40 (Screw) ×4 IMPLANT
SPONGE SURGIFOAM ABS GEL 100 (HEMOSTASIS) ×2 IMPLANT
SUT VIC AB 0 CT1 18XCR BRD8 (SUTURE) ×1 IMPLANT
SUT VIC AB 0 CT1 8-18 (SUTURE) ×2
SUT VIC AB 2-0 CT1 18 (SUTURE) ×2 IMPLANT
SUT VIC AB 3-0 SH 8-18 (SUTURE) ×2 IMPLANT
TOWEL GREEN STERILE (TOWEL DISPOSABLE) ×2 IMPLANT
TOWEL GREEN STERILE FF (TOWEL DISPOSABLE) ×2 IMPLANT
WATER STERILE IRR 1000ML POUR (IV SOLUTION) ×2 IMPLANT

## 2019-10-26 NOTE — Anesthesia Procedure Notes (Signed)
Central Venous Catheter Insertion Performed by: Roberts Gaudy, MD, anesthesiologist Start/End9/19/2021 11:40 AM, 10/26/2019 11:45 AM Patient location: OR. Preanesthetic checklist: patient identified, IV checked, site marked, risks and benefits discussed, surgical consent, monitors and equipment checked, pre-op evaluation, timeout performed and anesthesia consent Position: supine Lidocaine 1% used for infiltration and patient sedated Hand hygiene performed  and maximum sterile barriers used  Catheter size: 8 Fr Total catheter length 16. Central line was placed.Double lumen Procedure performed using ultrasound guided technique. Ultrasound Notes:image(s) printed for medical record Attempts: 1 Following insertion, dressing applied and line sutured. Post procedure assessment: blood return through all ports  Patient tolerated the procedure well with no immediate complications.

## 2019-10-26 NOTE — Anesthesia Procedure Notes (Signed)
Arterial Line Insertion Start/End9/19/2021 10:25 AM, 10/26/2019 10:35 AM Performed by: Lance Coon, CRNA, CRNA  Patient location: Pre-op. Preanesthetic checklist: patient identified, IV checked, site marked, risks and benefits discussed, surgical consent, monitors and equipment checked, pre-op evaluation, timeout performed and anesthesia consent Lidocaine 1% used for infiltration and patient sedated Left, radial was placed Catheter size: 20 G Hand hygiene performed , maximum sterile barriers used  and Seldinger technique used  Attempts: 1 Procedure performed without using ultrasound guided technique. Ultrasound Notes:anatomy identified, needle tip was noted to be adjacent to the nerve/plexus identified and no ultrasound evidence of intravascular and/or intraneural injection Following insertion, dressing applied and Biopatch. Post procedure assessment: normal and unchanged  Patient tolerated the procedure well with no immediate complications.

## 2019-10-26 NOTE — Op Note (Signed)
10/26/2019  1:53 PM  PATIENT:  Breanna Shields  79 y.o. female  PRE-OPERATIVE DIAGNOSIS:  FALL  POST-OPERATIVE DIAGNOSIS:  THORACIC NINE FRACTURE  PROCEDURE:  Procedure(s): THORACIC SEVEN-THORACIC ELEVEN PERCUTANEOUS PEDICLE SCREW PLACEMENT, ORIF T9 fracture with segmental pedicle screw fixation T7-T11 Nuvasive percutaneous SURGEON: Surgeon(s): Ashok Pall, MD  ASSISTANTS:none  ANESTHESIA:   general  EBL:  Total I/O In: 1500 [I.V.:1500] Out: 700 [Urine:650; Blood:50]  BLOOD ADMINISTERED:none  CELL SAVER GIVEN:none  COUNT:per nursing  DRAINS: none   SPECIMEN:  No Specimen  DICTATION: LYLY CANIZALES was taken to the operating room, intubated, and intubated without difficulty. She was positioned prone on a Jackson table with all pressure points properly padded. A foley catheter was placed under sterile conditions prior to positioning. Mrs. Abid's thoracic region was prepped and draped in a sterile manner. Using fluoroscopic guidance I planned the entry points of the pedicle screws and injected lidocaine at these points.  I placed pedicle screws at T7,8,10,and 11 bilaterally with fluoroscopic guidance in the same fashion. I identified the pedicle entry sites. I placed Jamshidi needles into the pedicles and checked alignment with lateral and ap images. I placed K-wires into the pedicles through the needles, then removed the needles. Once all the K-wires were placed I threaded cannulated pedicle screws over the wires and into the pedicles. After placing the screws, the rods were guided through the screw towers and secured. I removed the rod holder, then the towers. I checked final screw and rod placement with fluoroscopy and the construct was good. With the open reduction and internal fixation complete I closed the wounds and placed a sterile dressing. Mrs. Rua was rolled supine on the OR stretcher, extubated, and brought to the PACU.   PLAN OF CARE: Admit to inpatient    PATIENT DISPOSITION:  PACU - hemodynamically stable.   Delay start of Pharmacological VTE agent (>24hrs) due to surgical blood loss or risk of bleeding:  no

## 2019-10-26 NOTE — Anesthesia Preprocedure Evaluation (Addendum)
Anesthesia Evaluation  Patient identified by MRN, date of birth, ID band Patient awake    Reviewed: Allergy & Precautions, NPO status , Patient's Chart, lab work & pertinent test results  Airway Mallampati: III  TM Distance: >3 FB Neck ROM: Full    Dental  (+) Teeth Intact   Pulmonary former smoker,    breath sounds clear to auscultation       Cardiovascular hypertension,  Rhythm:Regular Rate:Normal     Neuro/Psych    GI/Hepatic   Endo/Other  diabetes  Renal/GU      Musculoskeletal   Abdominal (+) + obese,   Peds  Hematology   Anesthesia Other Findings   Reproductive/Obstetrics                             Anesthesia Physical Anesthesia Plan  ASA: III  Anesthesia Plan: General   Post-op Pain Management:    Induction: Intravenous  PONV Risk Score and Plan: Ondansetron  Airway Management Planned: Oral ETT  Additional Equipment: Arterial line and CVP  Intra-op Plan:   Post-operative Plan: Extubation in OR  Informed Consent: I have reviewed the patients History and Physical, chart, labs and discussed the procedure including the risks, benefits and alternatives for the proposed anesthesia with the patient or authorized representative who has indicated his/her understanding and acceptance.     Dental advisory given  Plan Discussed with: CRNA and Anesthesiologist  Anesthesia Plan Comments:         Anesthesia Quick Evaluation

## 2019-10-26 NOTE — Anesthesia Procedure Notes (Addendum)
Procedure Name: Intubation Date/Time: 10/26/2019 10:39 AM Performed by: Lance Coon, CRNA Pre-anesthesia Checklist: Patient identified, Emergency Drugs available, Patient being monitored, Suction available and Timeout performed Patient Re-evaluated:Patient Re-evaluated prior to induction Oxygen Delivery Method: Circle system utilized Preoxygenation: Pre-oxygenation with 100% oxygen Induction Type: IV induction Ventilation: Mask ventilation without difficulty Laryngoscope Size: Miller and 2 Grade View: Grade I Tube type: Oral Tube size: 7.0 mm Number of attempts: 1 Airway Equipment and Method: Stylet Placement Confirmation: ETT inserted through vocal cords under direct vision,  positive ETCO2 and breath sounds checked- equal and bilateral Secured at: 21 cm Tube secured with: Tape Dental Injury: Teeth and Oropharynx as per pre-operative assessment

## 2019-10-26 NOTE — Progress Notes (Signed)
BP (!) 141/57 (BP Location: Left Arm)   Pulse 89   Temp 97.9 F (36.6 C) (Oral)   Resp 18   Ht 5\' 3"  (1.6 m)   Wt 107.7 kg   SpO2 91%   BMI 42.06 kg/m  Alert and oriented x 4, speech is clear and fluent.  Moving all extremities well Complaints of back pain. Patient was in brace sitting up Risks and benefits of thoracic fusion due to T9 fracture were explained. Risks including but not limited to infection, bleeding, worsening pain, paralysis, bowel and or bladder dysfunction, weakness in one or both lower extremities, spinal cord damage, change in sensation lower body, fusion failure, hardware failure, and other risks were explained. She understands and wishes to proceed.

## 2019-10-26 NOTE — Transfer of Care (Signed)
Immediate Anesthesia Transfer of Care Note  Patient: Breanna Shields  Procedure(s) Performed: THORACIC SEVEN-THORACIC ELEVEN PERCUTANEOUS PEDICLE SCREW PLACEMENT (N/A Spine Thoracic)  Patient Location: PACU  Anesthesia Type:General  Level of Consciousness: drowsy and patient cooperative  Airway & Oxygen Therapy: Patient Spontanous Breathing and Patient connected to face mask oxygen  Post-op Assessment: Report given to RN and Post -op Vital signs reviewed and stable  Post vital signs: Reviewed and stable  Last Vitals:  Vitals Value Taken Time  BP 181/63 10/26/19 1332  Temp    Pulse 95 10/26/19 1333  Resp 22 10/26/19 1333  SpO2 96 % 10/26/19 1333  Vitals shown include unvalidated device data.  Last Pain:  Vitals:   10/26/19 0936  TempSrc:   PainSc: 5       Patients Stated Pain Goal: 3 (11/73/56 7014)  Complications: No complications documented.

## 2019-10-26 NOTE — Progress Notes (Signed)
[facility] PROGRESS NOTE   ELLIANA BAL FGH:[id number] DOB: 1940/07/16 DOA: 10/24/2019  PCP: Lorne Skeens, MD  Brief History/Interval Summary: 79 y.o. female with medical history significant of hypertension, hyperlipidemia, type 2 diabetes mellitus, morbid obesity with BMI of 41 presented to emergency department for evaluation of back pain after fall.  Evaluation in the ED revealed T9 vertebral body fracture.  Neurosurgery was consulted.  Patient was hospitalized for further management.  Reason for Visit: T9 vertebral body fracture  Consultants: Neurosurgery  Procedures: Plan is for back surgery today  Antibiotics: Anti-infectives (From admission, onward)   None      Subjective/Interval History: Patient noted to be mildly distracted.  Currently not experiencing any pain but with any movement the pain goes up her spine into the neck.  Denies any chest pain.  No shortness of breath.  No nausea or vomiting.     Assessment/Plan:  T9 vertebral fracture This is secondary to a fall.  Neurosurgery was consulted.  Plan is for surgery today.  Pain control.  Bowel regimen while she is on narcotics.  Eventually patient will need to be seen by PT and OT.  Essential hypertension Elevated blood pressure likely due to pain.  She is noted to be on ARB at home which has been resumed.  Hydralazine as needed.  Hyperlipidemia Continue statin.  History of depression and anxiety Continue Lexapro.  Normocytic anemia No evidence of overt bleeding.  Hemoglobin is stable.  Diabetes mellitus type 2 HbA1c 7.9.  Monitor CBGs.  SSI.  Holding Actos and Trulicity.  Leukocytosis/possible UTI Chest x-ray did not show any acute findings.  UA did show moderate leukocytes and positive nitrite.  Many bacteria 11-20 WBC.  Patient unable to tell me if she has any dysuria.  Treat with ceftriaxone for 3 days.  Severe obesity Estimated body mass index is 42.06 kg/m as calculated from the  following:   Height as of this encounter: 5\' 3"  (1.6 m).   Weight as of this encounter: 107.7 kg.   DVT Prophylaxis: Lovenox Code Status: Full code Family Communication: Discussed with the patient Disposition Plan: May need to go to skilled nursing facility for short-term rehab  Status is: Inpatient  Remains inpatient appropriate because:Ongoing active pain requiring inpatient pain management, IV treatments appropriate due to intensity of illness or inability to take PO and Inpatient level of care appropriate due to severity of illness   Dispo: The patient is from: Home              Anticipated d/c is to: To be determined              Anticipated d/c date is: 3 days              Patient currently is not medically stable to d/c.      Medications:  Scheduled: . [MAR Hold] enoxaparin (LOVENOX) injection  40 mg Subcutaneous Q24H  . [MAR Hold] insulin aspart  0-15 Units Subcutaneous TID WC  . [MAR Hold] insulin aspart  0-5 Units Subcutaneous QHS   Continuous: . lactated ringers 10 mL/hr at 10/26/19 1003   PRN:[MAR Hold] acetaminophen **OR** [MAR Hold] acetaminophen, [MAR Hold] hydrALAZINE, [MAR Hold]  HYDROmorphone (DILAUDID) injection, [MAR Hold] ondansetron **OR** [MAR Hold] ondansetron (ZOFRAN) IV, [MAR Hold] oxyCODONE   Objective:  Vital Signs  Vitals:   10/26/19 0014 10/26/19 0316 10/26/19 0735 10/26/19 0900  BP: (!) 155/61 (!) 167/71 (!) 141/57   Pulse: 88 89 89   Resp:  18 18 18    Temp: 99.5 F (37.5 C) 98.7 F (37.1 C) 97.9 F (36.6 C)   TempSrc: Oral Oral Oral   SpO2: 93% 92% 91% 91%  Weight:  107.7 kg    Height:        Intake/Output Summary (Last 24 hours) at 10/26/2019 0959 Last data filed at 10/25/2019 1542 Gross per 24 hour  Intake 240 ml  Output --  Net 240 ml   Filed Weights   10/24/19 2341 10/24/19 2349 10/26/19 0316  Weight: 107.5 kg 107.5 kg 107.7 kg    General appearance: Awake alert.  In no distress.  Distracted Resp: Clear to  auscultation bilaterally.  Normal effort Cardio: S1-S2 is normal regular.  No S3-S4.  No rubs murmurs or bruit GI: Abdomen is soft.  Nontender nondistended.  Bowel sounds are present normal.  No masses organomegaly Extremities: No edema.  Moving all extremities Neurologic: Noted to be distracted.  Mildly disoriented.  No obvious focal deficits noted.   Lab Results:  Data Reviewed: I have personally reviewed following labs and imaging studies  CBC: Recent Labs  Lab 10/25/19 0058 10/26/19 0705  WBC 12.3* 10.7*  HGB 11.1* 11.3*  HCT 36.7 36.5  MCV 99.5 97.9  PLT 251 732    Basic Metabolic Panel: Recent Labs  Lab 10/25/19 0058 10/25/19 1400 10/26/19 0705  NA 139  --  139  K 4.2  --  3.9  CL 104  --  104  CO2 26  --  27  GLUCOSE 171*  --  167*  BUN 23  --  13  CREATININE 1.00  --  0.74  CALCIUM 8.7*  --  8.9  MG  --  1.8  --   PHOS  --  2.4*  --     GFR: Estimated Creatinine Clearance: 68.2 mL/min (by C-G formula based on SCr of 0.74 mg/dL).  Liver Function Tests: Recent Labs  Lab 10/26/19 0705  AST 17  ALT 20  ALKPHOS 68  BILITOT 0.6  PROT 6.3*  ALBUMIN 3.0*    HbA1C: Recent Labs    10/25/19 1403  HGBA1C 7.9*    CBG: Recent Labs  Lab 10/25/19 1600 10/25/19 2136 10/26/19 0610 10/26/19 0903  GLUCAP 177* 204* 160* 136*     Thyroid Function Tests: Recent Labs    10/25/19 1543  TSH 1.471     Recent Results (from the past 240 hour(s))  SARS Coronavirus 2 by RT PCR (hospital order, performed in [facility] hospital lab) Nasopharyngeal Nasopharyngeal Swab     Status: None   Collection Time: 10/25/19  7:38 AM   Specimen: Nasopharyngeal Swab  Result Value Ref Range Status   SARS Coronavirus 2 NEGATIVE NEGATIVE Final    Comment: (NOTE) SARS-CoV-2 target nucleic acids are NOT DETECTED.  The SARS-CoV-2 RNA is generally detectable in upper and lower respiratory specimens during the acute phase of infection. The lowest concentration of  SARS-CoV-2 viral copies this assay can detect is 250 copies / mL. A negative result does not preclude SARS-CoV-2 infection and should not be used as the sole basis for treatment or other patient management decisions.  A negative result may occur with improper specimen collection / handling, submission of specimen other than nasopharyngeal swab, presence of viral mutation(s) within the areas targeted by this assay, and inadequate number of viral copies (<250 copies / mL). A negative result must be combined with clinical observations, patient history, and epidemiological information.  Fact Sheet for Patients:  StrictlyIdeas.no  Fact Sheet for Healthcare Providers: BankingDealers.co.za  This test is not yet approved or  cleared by the [place] FDA and has been authorized for detection and/or diagnosis of SARS-CoV-2 by FDA under an Emergency Use Authorization (EUA).  This EUA will remain in effect (meaning this test can be used) for the duration of the COVID-19 declaration under Section 564(b)(1) of the Act, 21 U.S.C. section 360bbb-3(b)(1), unless the authorization is terminated or revoked sooner.  Performed at [facility], [place] [address], [place], [place] [address]   Surgical pcr screen     Status: None   Collection Time: 10/25/19 10:01 PM   Specimen: Nasal Mucosa; Nasal Swab  Result Value Ref Range Status   MRSA, PCR NEGATIVE NEGATIVE Final   Staphylococcus aureus NEGATIVE NEGATIVE Final    Comment: (NOTE) The Xpert SA Assay (FDA approved for NASAL specimens in patients 64 years of age and older), is one component of a comprehensive surveillance program. It is not intended to diagnose infection nor to guide or monitor treatment. Performed at [facility], [place] [address] [place], [place] [address]       Radiology Studies: DG Chest 1 View  Result Date: 10/25/2019 CLINICAL DATA:  Fall, chest pain EXAM: CHEST   1 VIEW COMPARISON:  None. FINDINGS: Lungs are clear. No pneumothorax or pleural effusion. Cardiac size is mildly enlarged. Pulmonary vascularity is normal. No acute bone abnormality. IMPRESSION: No active disease. Electronically Signed   By: Fidela Salisbury MD   On: 10/25/2019 01:33   DG Thoracic Spine 2 View  Addendum Date: 10/25/2019   ADDENDUM REPORT: 10/25/2019 01:33 ADDENDUM: These results were called by telephone at the time of interpretation on 10/25/2019 at 1:31 am to provider [facility] , who verbally acknowledged these results. Electronically Signed   By: Fidela Salisbury MD   On: 10/25/2019 01:33   Result Date: 10/25/2019 CLINICAL DATA:  Fall, back pain EXAM: THORACIC SPINE 2 VIEWS COMPARISON:  None. FINDINGS: There is a a transverse fracture involving the inferior endplate of T9 with mild angular deformity of the thoracic spine at this level. There is mild paravertebral stripe widening in this region likely representing edema or hemorrhage secondary to acute fracture. No associated listhesis. Degenerative changes are seen throughout the thoracic spine. Vertebral body height has been preserved. IMPRESSION: Acute fracture of the thoracic spine with angular deformity at T9. CT examination of the thoracic spine is recommended for further evaluation. Electronically Signed: By: Fidela Salisbury MD On: 10/25/2019 01:29   DG Lumbar Spine Complete  Result Date: 10/25/2019 CLINICAL DATA:  Fall, back EXAM: LUMBAR SPINE - COMPLETE 4+ VIEW COMPARISON:  None. FINDINGS: There is normal lumbar lordosis. No acute fracture or listhesis of the lumbar spine. Vertebral body height has been preserved. There is diffuse intervertebral disc space narrowing and endplate remodeling throughout the lumbar spine in keeping with changes of mild to moderate degenerative disc disease. Oblique views demonstrate no evidence of pars defect. The paraspinal soft tissues are unremarkable. Vascular calcifications are seen within  the abdominal aorta anterior to the lumbar spine. IMPRESSION: No acute fracture or listhesis.  Diffuse degenerative disc disease. Electronically Signed   By: Fidela Salisbury MD   On: 10/25/2019 01:30   CT HEAD WO CONTRAST  Result Date: 10/25/2019 CLINICAL DATA:  Slipped and fell EXAM: CT HEAD WITHOUT CONTRAST TECHNIQUE: Contiguous axial images were obtained from the base of the skull through the vertex without intravenous contrast. COMPARISON:  None.  FINDINGS: Brain: No acute infarct or hemorrhage. Lateral ventricles and midline structures are unremarkable. No acute extra-axial fluid collections. No mass effect. Vascular: No hyperdense vessel or unexpected calcification. Skull: Normal. Negative for fracture or focal lesion. Sinuses/Orbits: No acute finding. Other: None. IMPRESSION: 1. No acute intracranial process. Electronically Signed   By: Randa Ngo M.D.   On: 10/25/2019 01:11   CT CERVICAL SPINE WO CONTRAST  Result Date: 10/25/2019 CLINICAL DATA:  Slipped and fell EXAM: CT CERVICAL SPINE WITHOUT CONTRAST TECHNIQUE: Multidetector CT imaging of the cervical spine was performed without intravenous contrast. Multiplanar CT image reconstructions were also generated. COMPARISON:  None. FINDINGS: Alignment: Alignment is anatomic. Skull base and vertebrae: No acute displaced cervical spine fracture. Soft tissues and spinal canal: No prevertebral fluid or swelling. No visible canal hematoma. Disc levels: There is mild diffuse cervical spondylosis, eccentric to the right. Mild C6/C7 spondylosis. Mild right neural foraminal narrowing at C4-5 and C5-6. Upper chest: Airway is patent.  Lung apices are clear. Other: Reconstructed images demonstrate no additional findings. IMPRESSION: 1. No acute cervical spine fracture. 2. Multilevel cervical spondylosis and facet hypertrophy. Electronically Signed   By: Randa Ngo M.D.   On: 10/25/2019 01:09   CT Thoracic Spine Wo Contrast  Result Date: 10/25/2019 CLINICAL  DATA:  Traumatic spinal fracture EXAM: CT THORACIC SPINE WITHOUT CONTRAST TECHNIQUE: Multidetector CT images of the thoracic were obtained using the standard protocol without intravenous contrast. COMPARISON:  10/25/2019 thoracic spine radiograph FINDINGS: Alignment: Physiologic Vertebrae: There is an acute fracture of T9 that involves the anterior wall and superior endplate. The fracture line also traverses a right-sided osteophyte between T9 and T8. There is ankylosis of the thoracic spine due to flowing anterior enthesophytes. Paraspinal and other soft tissues: Small right pleural effusion. There are coronary artery calcifications and calcific aortic atherosclerosis. Disc levels: There is no spinal canal stenosis. IMPRESSION: 1. Mildly distracted fracture of T9 in the context of a rigid midthoracic spine. Thoracic spine MRI is recommended to assess for spinal cord injury and ligamentous damage. 2. Small right pleural effusion. Aortic Atherosclerosis (ICD10-I70.0). Electronically Signed   By: Ulyses Jarred M.D.   On: 10/25/2019 03:08   MR THORACIC SPINE WO CONTRAST  Result Date: 10/25/2019 CLINICAL DATA:  Fall.  Traumatic spinal fracture. EXAM: MRI THORACIC SPINE WITHOUT CONTRAST TECHNIQUE: Multiplanar, multisequence MR imaging of the thoracic spine was performed. No intravenous contrast was administered. COMPARISON:  CT thoracic spine 10/25/2019 FINDINGS: Alignment: Mild anterolisthesis T1-2 and T2-3. Mild to moderate dorsal kyphosis. Vertebrae: Fracture T9 vertebral body. Well-defined fracture line is seen in the vertebral body extending to the inferior endplate. Improved alignment compared with the CT when there was distraction of the inferior portion of the fracture. Surprisingly, there is minimal bone marrow edema around the fracture. The fracture appears acute on CT. No other fracture identified. No epidural hematoma. Negative for mass lesion. Cord:  Normal signal and morphology.  No cord compression  Paraspinal and other soft tissues: Small right pleural effusion. No paraspinous hematoma. Disc levels: Multilevel disc degeneration throughout the thoracic spine. Small right-sided disc protrusion at T6-7. Small left-sided disc protrusion and spurring at T7-8. Small central disc protrusion and spurring at T8-9. Small central disc protrusion T2-10 11 and T11-12. IMPRESSION: Fracture T9 vertebral body. This fracture shows improved alignment compared with CT earlier today with decreased distraction. The fracture appears acute on CT. Surprisingly there is minimal bone marrow edema. No epidural hematoma or spinal stenosis. Electronically Signed   By: Juanda Crumble  Carlis Abbott M.D.   On: 10/25/2019 12:15       LOS: 1 day   [facility] Pager on [email or web address]  10/26/2019, 9:59 AM

## 2019-10-27 ENCOUNTER — Other Ambulatory Visit: Payer: Self-pay

## 2019-10-27 DIAGNOSIS — R0902 Hypoxemia: Secondary | ICD-10-CM

## 2019-10-27 DIAGNOSIS — N390 Urinary tract infection, site not specified: Secondary | ICD-10-CM

## 2019-10-27 LAB — COMPREHENSIVE METABOLIC PANEL
ALT: 24 U/L (ref 0–44)
AST: 25 U/L (ref 15–41)
Albumin: 2.9 g/dL — ABNORMAL LOW (ref 3.5–5.0)
Alkaline Phosphatase: 62 U/L (ref 38–126)
Anion gap: 9 (ref 5–15)
BUN: 14 mg/dL (ref 8–23)
CO2: 24 mmol/L (ref 22–32)
Calcium: 8.8 mg/dL — ABNORMAL LOW (ref 8.9–10.3)
Chloride: 106 mmol/L (ref 98–111)
Creatinine, Ser: 0.7 mg/dL (ref 0.44–1.00)
GFR calc Af Amer: >60 mL/min (ref >60)
GFR calc non Af Amer: >60 mL/min (ref >60)
Glucose, Bld: 164 mg/dL — ABNORMAL HIGH (ref 70–99)
Potassium: 3.8 mmol/L (ref 3.5–5.1)
Sodium: 139 mmol/L (ref 135–145)
Total Bilirubin: 0.7 mg/dL (ref 0.3–1.2)
Total Protein: 6.1 g/dL — ABNORMAL LOW (ref 6.5–8.1)

## 2019-10-27 LAB — GLUCOSE, CAPILLARY
Glucose-Capillary: 214 mg/dL — ABNORMAL HIGH (ref 70–99)
Glucose-Capillary: 184 mg/dL — ABNORMAL HIGH (ref 70–99)
Glucose-Capillary: 153 mg/dL — ABNORMAL HIGH (ref 70–99)
Glucose-Capillary: 156 mg/dL — ABNORMAL HIGH (ref 70–99)
Glucose-Capillary: 165 mg/dL — ABNORMAL HIGH (ref 70–99)
Glucose-Capillary: 132 mg/dL — ABNORMAL HIGH (ref 70–99)

## 2019-10-27 LAB — CBC
HCT: 33.9 % — ABNORMAL LOW (ref 36.0–46.0)
Hemoglobin: 10.4 g/dL — ABNORMAL LOW (ref 12.0–15.0)
MCH: 29.5 pg (ref 26.0–34.0)
MCHC: 30.7 g/dL (ref 30.0–36.0)
MCV: 96 fL (ref 80.0–100.0)
Platelets: 234 10*3/uL (ref 150–400)
RBC: 3.53 MIL/uL — ABNORMAL LOW (ref 3.87–5.11)
RDW: 13.1 % (ref 11.5–15.5)
WBC: 19.7 10*3/uL — ABNORMAL HIGH (ref 4.0–10.5)
nRBC: 0 % (ref 0.0–0.2)

## 2019-10-27 NOTE — Progress Notes (Signed)
Patient has not voided since foley was removed this AM around 0545. This RN bladder scanned pt at 1348 with  206 ml in bladder. Rechecked and bladder scanned patient at 1739 with 245 ml only in bladder. Pt still has not voided. MD [place] Pink paged and made aware.

## 2019-10-27 NOTE — Patient Outreach (Signed)
  [facility]) Care Management Chronic Special Needs Program    10/27/2019  Name: Breanna Shields, DOB: 17-Mar-1940  MRN: [id number]   Breanna Shields is enrolled in a chronic special needs plan. Per chart: Client presented to the hospital on 9/17 status post fall with back pain. Noted to have fracture T9 with surgery recommended. Client admitted to the hospital. [place] sent to [facility] utilization management team.  Plan: RNCM will continue to follow. Updated assigned Care coordinator, Peter Garter, RNCM.  Covering RNCM: Thea Silversmith, RN, MSN, [place] [phone]

## 2019-10-27 NOTE — Evaluation (Signed)
Occupational Therapy Evaluation Patient Details Name: Breanna Shields MRN: [id number] DOB: 1940-11-06 Today's Date: 10/27/2019    History of Present Illness 79 yo female from home s/p fall hitting head backward. pt underwent T7-11 pedicule screw placement ORIF t9 fx  PMH DM2, HLD, HTN, R revision TKA,BIL TKA,   Clinical Impression   Patient is s/p t7-t11 pedicule screw surgery resulting in functional limitations due to the deficits listed below (see OT problem list). Pt noted to have vestibular deficits limiting progression with therapy. Pt will require medication prior to next session to help with vestibular assessment. Pt with poor fitting TLSO and will need to don in standing to assess fit next session. Pt lethargic and requires repositioning to help with arousal. Once returned to supine immediately asleep. Spouse reports pt wakes at 2-3 Am daily then sleeps during the day.  Patient will benefit from skilled OT acutely to increase independence and safety with ADLS to allow discharge CIR.     Follow Up Recommendations  CIR    Equipment Recommendations  3 in 1 bedside commode    Recommendations for Other Services Other (comment) (vestibular eval)     Precautions / Restrictions Precautions Precautions: Back Precaution Comments: issued handout of back precautions Required Braces or Orthoses: Spinal Brace Spinal Brace: Thoracolumbosacral orthotic;Applied in sitting position      Mobility Bed Mobility Overal bed mobility: Needs Assistance Bed Mobility: Rolling;Supine to Sit;Sit to Supine Rolling: Min assist   Supine to sit: Min assist Sit to supine: Mod assist   General bed mobility comments: pt needs cues for sequence and cues for back precautions.pt requires (A) to place bil LE back on bed surface  Transfers Overall transfer level: Needs assistance   Transfers: Sit to/from Stand Sit to Stand: +2 physical assistance;Max assist         General transfer comment:  requires (A) To sustain power up from surface and (A) for balance.     Balance Overall balance assessment: Needs assistance                                         ADL either performed or assessed with clinical judgement   ADL Overall ADL's : Needs assistance/impaired Eating/Feeding: Minimal assistance;Bed level     Grooming Details (indicate cue type and reason): unable to attempt this session Upper Body Bathing: Maximal assistance   Lower Body Bathing: Total assistance   Upper Body Dressing : Maximal assistance   Lower Body Dressing: Total assistance   Toilet Transfer: +2 for physical assistance;Maximal assistance                   Vision Baseline Vision/History: Wears glasses Wears Glasses: At all times       Perception     Praxis      Pertinent Vitals/Pain Pain Assessment: 0-10 Pain Score: 8  Pain Location: back Pain Descriptors / Indicators: Dull;Grimacing;Guarding Pain Intervention(s): Monitored during session;Premedicated before session;Repositioned     Hand Dominance Right   Extremity/Trunk Assessment Upper Extremity Assessment Upper Extremity Assessment: Overall WFL for tasks assessed   Lower Extremity Assessment Lower Extremity Assessment: Defer to PT evaluation   Cervical / Trunk Assessment Cervical / Trunk Assessment: Other exceptions Cervical / Trunk Exceptions: s/p surg   Communication Communication Communication: HOH   Cognition Arousal/Alertness: Lethargic Behavior During Therapy: Flat affect Overall Cognitive Status: Impaired/Different from baseline Area of Impairment: Orientation;Attention;Awareness;Safety/judgement  Orientation Level: Disoriented to;Time;Situation Current Attention Level: Sustained     Safety/Judgement: Decreased awareness of safety;Decreased awareness of deficits Awareness: Intellectual   General Comments: pt very fixated on nausea and vestibular deficits present.    General Comments  noted to have bruising on back of head    Exercises Exercises: Other exercises Other Exercises Other Exercises: pt demonstrates BPPV and noted to have R beating nystagmus. pt states "dont take me to the Right" showing awareness to onset   Shoulder Instructions      Home Living Family/patient expects to be discharged to:: Private residence Living Arrangements: Spouse/significant other Available Help at Discharge: Family Type of Home: House Home Access: [place]: One level     Bathroom Shower/Tub: [place]: Handicapped height     Home Equipment: Environmental consultant - 2 wheels;Bedside commode   Additional Comments: daughter can help PRN      Prior Functioning/Environment Level of Independence: Independent                 OT Problem List: Decreased strength;Decreased activity tolerance;Impaired balance (sitting and/or standing);Decreased coordination;Decreased safety awareness;Decreased knowledge of use of DME or AE;Decreased knowledge of precautions;Obesity;Pain      OT Treatment/Interventions: Self-care/ADL training;Therapeutic exercise;Neuromuscular education;Energy conservation;DME and/or AE instruction;Manual therapy;Therapeutic activities;Cognitive remediation/compensation;Patient/family education;Balance training    OT Goals(Current goals can be found in the care plan section) Acute Rehab OT Goals Patient Stated Goal: to lay down OT Goal Formulation: With patient/family Time For Goal Achievement: 11/10/19 Potential to Achieve Goals: Good  OT Frequency: Min 2X/week   Barriers to D/C:            Co-evaluation PT/OT/SLP Co-Evaluation/Treatment: Yes Reason for Co-Treatment: Complexity of the patient's impairments (multi-system involvement);Necessary to address cognition/behavior during functional activity;For patient/therapist safety;To address functional/ADL transfers   OT goals addressed during  session: ADL's and self-care;Proper use of Adaptive equipment and DME;Strengthening/ROM      AM-PAC OT "6 Clicks" Daily Activity     Outcome Measure Help from another person eating meals?: A Little Help from another person taking care of personal grooming?: A Little Help from another person toileting, which includes using toliet, bedpan, or urinal?: A Lot Help from another person bathing (including washing, rinsing, drying)?: A Lot Help from another person to put on and taking off regular upper body clothing?: A Lot Help from another person to put on and taking off regular lower body clothing?: A Lot 6 Click Score: 14   End of Session Equipment Utilized During Treatment: Back brace Nurse Communication: Mobility status;Precautions  Activity Tolerance: Patient limited by lethargy;Other (comment) (vestibular) Patient left: in bed;with call bell/phone within reach;with chair alarm set;with family/visitor present;with nursing/sitter in room  OT Visit Diagnosis: Unsteadiness on feet (R26.81);Muscle weakness (generalized) (M62.81);BPPV;Pain BPPV - Right/Left: Right                Time: 9201-0071 OT Time Calculation (min): 27 min Charges:  OT General Charges $OT Visit: 1 Visit OT Evaluation $OT Eval Moderate Complexity: 1 Mod OT Treatments $Self Care/Home Management : 8-22 mins   Brynn, OTR/L  Acute Rehabilitation Services Pager: [phone] Office: [phone] .   Jeri Modena 10/27/2019, 4:01 PM

## 2019-10-27 NOTE — Progress Notes (Addendum)
[facility] PROGRESS NOTE   Breanna Shields ZOX:[id number] DOB: February 28, 1940 DOA: 10/24/2019  PCP: Lorne Skeens, MD  Brief History/Interval Summary: 79 y.o. female with medical history significant of hypertension, hyperlipidemia, type 2 diabetes mellitus, morbid obesity with BMI of 41 presented to emergency department for evaluation of back pain after fall.  Evaluation in the ED revealed T9 vertebral body fracture.  Neurosurgery was consulted.  Patient was hospitalized for further management.  Reason for Visit: T9 vertebral body fracture  Consultants: Neurosurgery  Procedures: 9/19: THORACIC SEVEN-THORACIC ELEVEN PERCUTANEOUS PEDICLE SCREW PLACEMENT, ORIF T9 fracture with segmental pedicle screw fixation T7-T11  Antibiotics: Anti-infectives (From admission, onward)   Start     Dose/Rate Route Frequency Ordered Stop   10/26/19 1600  cefTRIAXone (ROCEPHIN) 1 g in sodium chloride 0.9 % 100 mL IVPB        1 g 200 mL/hr over 30 Minutes Intravenous Every 24 hours 10/26/19 1008 10/29/19 1559   10/26/19 1113  sodium chloride 0.9 % with cefTRIAXone (ROCEPHIN) ADS Med       Note to Pharmacy: Bobbie Stack   : cabinet override      10/26/19 1113 10/26/19 2329      Subjective/Interval History: Patient noted to be somnolent this morning.  Easily arousable.  Denies any shortness of breath or chest pain.  She does have pain in her upper back as well as neck area.  Could not sleep well as a result.  Although she was noted to be snoring this morning.  Denies any history of sleep apnea.    Assessment/Plan:  T9 vertebral fracture This is secondary to a fall.  Neurosurgery was consulted.  Patient underwent surgery on 9/19.  Pain control.  Bowel regimen.  PT and OT evaluation.    Hypoxia She was noted to be hypoxic after her surgery.  Currently on nasal cannula.  Does not use oxygen at home.  No diagnosis of sleep apnea.  Chest x-ray showed atelectasis.  Continue with incentive  spirometer.  Wean down oxygen as tolerated.  No respiratory symptoms currently.  Essential hypertension Elevated blood pressure likely due to pain.  She is noted to be on ARB at home which has been resumed.  Hydralazine as needed.  Occasional high readings noted.  Continue to monitor for now.  Hyperlipidemia Continue statin.  History of depression and anxiety Continue Lexapro.  Normocytic anemia No evidence of overt bleeding.  Labs are pending this morning.  Diabetes mellitus type 2 HbA1c 7.9.  Monitor CBGs.  SSI.  Holding Actos and Trulicity.  CBGs are reasonably well controlled.  May need to initiate basal insulin.  UTI Chest x-ray did not show any acute findings.  UA did show moderate leukocytes and positive nitrite.  Many bacteria 11-20 WBC.  Patient unable to tell me if she has any dysuria.  Treat with ceftriaxone for 3 days.  Follow-up on cultures.  Follow-up on CBC.  Severe obesity Estimated body mass index is 42.06 kg/m as calculated from the following:   Height as of this encounter: 5\' 3"  (1.6 m).   Weight as of this encounter: 107.7 kg.   DVT Prophylaxis: SQ heparin Code Status: Full code Family Communication: Discussed with the patient Disposition Plan: May need to go to skilled nursing facility for short-term rehab  Status is: Inpatient  Remains inpatient appropriate because:Ongoing active pain requiring inpatient pain management, IV treatments appropriate due to intensity of illness or inability to take PO and Inpatient level of care appropriate due to  severity of illness   Dispo: The patient is from: Home              Anticipated d/c is to: To be determined              Anticipated d/c date is: 3 days              Patient currently is not medically stable to d/c.      Medications:  Scheduled: . B-complex with vitamin C  1 tablet Oral Daily  . celecoxib  200 mg Oral Q12H  . Chlorhexidine Gluconate Cloth  6 each Topical Daily  . cholecalciferol  2,000  Units Oral Daily  . escitalopram  10 mg Oral Daily  . heparin injection (subcutaneous)  5,000 Units Subcutaneous Q8H  . insulin aspart  0-15 Units Subcutaneous TID WC  . insulin aspart  0-5 Units Subcutaneous QHS  . irbesartan  300 mg Oral Daily  . multivitamin with minerals  1 tablet Oral Daily  . pioglitazone  30 mg Oral Daily  . senna  1 tablet Oral BID  . sodium chloride flush  3 mL Intravenous Q12H   Continuous: . sodium chloride    . 0.9 % NaCl with KCl 20 mEq / L 80 mL/hr at 10/26/19 1828  . cefTRIAXone (ROCEPHIN)  IV     JJO:[id number] **OR** acetaminophen, bisacodyl, diazepam, hydrALAZINE, HYDROcodone-acetaminophen, HYDROmorphone (DILAUDID) injection, magnesium citrate, meclizine, menthol-cetylpyridinium **OR** phenol, morphine injection, ondansetron **OR** ondansetron (ZOFRAN) IV, oxyCODONE, senna-docusate, sodium chloride flush, zolpidem   Objective:  Vital Signs  Vitals:   10/26/19 2023 10/26/19 2300 10/27/19 0453 10/27/19 0742  BP: (!) 155/56 (!) 144/70 (!) 153/59 (!) 177/63  Pulse:  (!) 102 (!) 104 95  Resp: 18 18 18    Temp: 97.6 F (36.4 C) 98.9 F (37.2 C) 98.3 F (36.8 C) 98.6 F (37 C)  TempSrc: Oral Axillary Oral Oral  SpO2:  94% 92% 97%  Weight:      Height:        Intake/Output Summary (Last 24 hours) at 10/27/2019 0950 Last data filed at 10/27/2019 0500 Gross per 24 hour  Intake 1500 ml  Output 1800 ml  Net -300 ml   Filed Weights   10/24/19 2341 10/24/19 2349 10/26/19 0316  Weight: 107.5 kg 107.5 kg 107.7 kg    General appearance: Somnolent but easily arousable.  No distress Resp: Normal effort at rest.  Diminished air entry at the bases.  No definite crackles wheezing or rhonchi. Cardio: S1-S2 is normal regular.  No S3-S4.  No rubs murmurs or bruit GI: Abdomen is soft.  Nontender nondistended.  Bowel sounds are present normal.  No masses organomegaly Extremities: No edema.  Moving all extremities Neurologic:No focal neurological  deficits.    Lab Results:  Data Reviewed: I have personally reviewed following labs and imaging studies  CBC: Recent Labs  Lab 10/25/19 0058 10/26/19 0705  WBC 12.3* 10.7*  HGB 11.1* 11.3*  HCT 36.7 36.5  MCV 99.5 97.9  PLT 251 093    Basic Metabolic Panel: Recent Labs  Lab 10/25/19 0058 10/25/19 1400 10/26/19 0705  NA 139  --  139  K 4.2  --  3.9  CL 104  --  104  CO2 26  --  27  GLUCOSE 171*  --  167*  BUN 23  --  13  CREATININE 1.00  --  0.74  CALCIUM 8.7*  --  8.9  MG  --  1.8  --  PHOS  --  2.4*  --     GFR: Estimated Creatinine Clearance: 68.2 mL/min (by C-G formula based on SCr of 0.74 mg/dL).  Liver Function Tests: Recent Labs  Lab 10/26/19 0705  AST 17  ALT 20  ALKPHOS 68  BILITOT 0.6  PROT 6.3*  ALBUMIN 3.0*    HbA1C: Recent Labs    10/25/19 1403  HGBA1C 7.9*    CBG: Recent Labs  Lab 10/26/19 1648 10/26/19 2109 10/27/19 0615 10/27/19 0632 10/27/19 0859  GLUCAP 276* 275* 214* 184* 153*     Thyroid Function Tests: Recent Labs    10/25/19 1543  TSH 1.471     Recent Results (from the past 240 hour(s))  SARS Coronavirus 2 by RT PCR (hospital order, performed in [place] hospital lab) Nasopharyngeal Nasopharyngeal Swab     Status: None   Collection Time: 10/25/19  7:38 AM   Specimen: Nasopharyngeal Swab  Result Value Ref Range Status   SARS Coronavirus 2 NEGATIVE NEGATIVE Final    Comment: (NOTE) SARS-CoV-2 target nucleic acids are NOT DETECTED.  The SARS-CoV-2 RNA is generally detectable in upper and lower respiratory specimens during the acute phase of infection. The lowest concentration of SARS-CoV-2 viral copies this assay can detect is 250 copies / mL. A negative result does not preclude SARS-CoV-2 infection and should not be used as the sole basis for treatment or other patient management decisions.  A negative result may occur with improper specimen collection / handling, submission of specimen other than  nasopharyngeal swab, presence of viral mutation(s) within the areas targeted by this assay, and inadequate number of viral copies (<250 copies / mL). A negative result must be combined with clinical observations, patient history, and epidemiological information.  Fact Sheet for Patients:   StrictlyIdeas.no  Fact Sheet for Healthcare Providers: BankingDealers.co.za  This test is not yet approved or  cleared by the [place] FDA and has been authorized for detection and/or diagnosis of SARS-CoV-2 by FDA under an Emergency Use Authorization (EUA).  This EUA will remain in effect (meaning this test can be used) for the duration of the COVID-19 declaration under Section 564(b)(1) of the Act, 21 U.S.C. section 360bbb-3(b)(1), unless the authorization is terminated or revoked sooner.  Performed at [facility], [place] [address], [place], [place] [address]   Surgical pcr screen     Status: None   Collection Time: 10/25/19 10:01 PM   Specimen: Nasal Mucosa; Nasal Swab  Result Value Ref Range Status   MRSA, PCR NEGATIVE NEGATIVE Final   Staphylococcus aureus NEGATIVE NEGATIVE Final    Comment: (NOTE) The Xpert SA Assay (FDA approved for NASAL specimens in patients 50 years of age and older), is one component of a comprehensive surveillance program. It is not intended to diagnose infection nor to guide or monitor treatment. Performed at [facility], [place] [address] [place], [place] [address]       Radiology Studies: DG Chest 1 View  Result Date: 10/26/2019 CLINICAL DATA:  Central line placement EXAM: CHEST  1 VIEW COMPARISON:  10/25/2019 FINDINGS: Left jugular central venous catheter tip in the mid SVC. No pneumothorax. Heart size prominent. Negative for heart failure or edema. Mild atelectasis bilaterally. No effusion Interval placement of thoracic spine fusion hardware in the mid and lower thoracic spine. IMPRESSION:  Satisfactory central line placement Mild bibasilar atelectasis.  Is Electronically Signed   By: Franchot Gallo M.D.   On: 10/26/2019 14:35   DG Thoracic Spine  2 View  Result Date: 10/26/2019 CLINICAL DATA:  Thoracic spine fracture fixation EXAM: THORACIC SPINE 2 VIEWS; DG C-ARM 1-60 MIN COMPARISON:  MRI thoracic spine 10/25/2019 FINDINGS: AP and lateral C-arm images of the thoracic spine demonstrate bilateral pedicle screw and rod fusion in the lower thoracic spine. Accurate level determination is not possible on these limited images. IMPRESSION: Bilateral pedicle screw and rod fusion lower thoracic spine. Electronically Signed   By: Franchot Gallo M.D.   On: 10/26/2019 14:34   MR THORACIC SPINE WO CONTRAST  Result Date: 10/25/2019 CLINICAL DATA:  Fall.  Traumatic spinal fracture. EXAM: MRI THORACIC SPINE WITHOUT CONTRAST TECHNIQUE: Multiplanar, multisequence MR imaging of the thoracic spine was performed. No intravenous contrast was administered. COMPARISON:  CT thoracic spine 10/25/2019 FINDINGS: Alignment: Mild anterolisthesis T1-2 and T2-3. Mild to moderate dorsal kyphosis. Vertebrae: Fracture T9 vertebral body. Well-defined fracture line is seen in the vertebral body extending to the inferior endplate. Improved alignment compared with the CT when there was distraction of the inferior portion of the fracture. Surprisingly, there is minimal bone marrow edema around the fracture. The fracture appears acute on CT. No other fracture identified. No epidural hematoma. Negative for mass lesion. Cord:  Normal signal and morphology.  No cord compression Paraspinal and other soft tissues: Small right pleural effusion. No paraspinous hematoma. Disc levels: Multilevel disc degeneration throughout the thoracic spine. Small right-sided disc protrusion at T6-7. Small left-sided disc protrusion and spurring at T7-8. Small central disc protrusion and spurring at T8-9. Small central disc protrusion T2-10 11 and T11-12.  IMPRESSION: Fracture T9 vertebral body. This fracture shows improved alignment compared with CT earlier today with decreased distraction. The fracture appears acute on CT. Surprisingly there is minimal bone marrow edema. No epidural hematoma or spinal stenosis. Electronically Signed   By: Franchot Gallo M.D.   On: 10/25/2019 12:15   DG C-Arm 1-60 Min  Result Date: 10/26/2019 CLINICAL DATA:  Thoracic spine fracture fixation EXAM: THORACIC SPINE 2 VIEWS; DG C-ARM 1-60 MIN COMPARISON:  MRI thoracic spine 10/25/2019 FINDINGS: AP and lateral C-arm images of the thoracic spine demonstrate bilateral pedicle screw and rod fusion in the lower thoracic spine. Accurate level determination is not possible on these limited images. IMPRESSION: Bilateral pedicle screw and rod fusion lower thoracic spine. Electronically Signed   By: Franchot Gallo M.D.   On: 10/26/2019 14:34       LOS: 2 days   [facility] Pager on [email or web address]  10/27/2019, 9:50 AM

## 2019-10-27 NOTE — Progress Notes (Signed)
Inpatient Rehab Admissions Coordinator Note:   Per therapy recommendations, pt was screened for CIR candidacy by Jolette Lana, MS CCC-SLP. At this time, Pt. Appears to have functional decline and is a good candidate for CIR. Will place order for rehab consult per protocol.  Please contact me with questions.   Kayson Bullis, MS, CCC-SLP Rehab Admissions Coordinator  [phone] (celll) [phone] (office)  

## 2019-10-27 NOTE — Evaluation (Signed)
Physical Therapy Evaluation Patient Details Name: Breanna Shields MRN: [id number] DOB: [birth year] Today's Date: 10/27/2019   History of Present Illness  79 yo female from home s/p fall hitting head backward. pt underwent T7-11 pedicule screw placement ORIF t9 fx  PMH DM2, HLD, HTN, R revision TKA,BIL TKA,  Clinical Impression   Patient is s/p fall requiring above surgery resulting in functional limitations due to the deficits listed below (see PT Problem List). Patient did hit head during fall and demonstrated +symptoms of right posterior canal BPPV during mobility. Patient too nauseated to attempt cannalith repositioning. (Will coordinate with nausea meds and ?meclizine 9/21 to attempt). Anticipate slower progress due to patient's complexities.  Patient will benefit from skilled PT to increase their independence and safety with mobility to allow discharge to the venue listed below.       Follow Up Recommendations CIR    Equipment Recommendations  Other (comment) (TBD )    Recommendations for Other Services Rehab consult     Precautions / Restrictions Precautions Precautions: Back Precaution Booklet Issued: Yes (comment) Precaution Comments: issued handout of back precautions Required Braces or Orthoses: Spinal Brace Spinal Brace: Thoracolumbosacral orthotic;Applied in sitting position      Mobility  Bed Mobility Overal bed mobility: Needs Assistance Bed Mobility: Rolling;Supine to Sit;Sit to Supine Rolling: Min assist   Supine to sit: Min assist Sit to supine: Mod assist   General bed mobility comments: pt needs cues for sequence and cues for back precautions.pt requires (A) to place bil LE back on bed surface  Transfers Overall transfer level: Needs assistance   Transfers: Sit to/from Stand Sit to Stand: +2 physical assistance;Mod assist         General transfer comment: requires (A) To sustain power up from surface and (A) for balance.    Ambulation/Gait Ambulation/Gait assistance: Mod assist;+2 physical assistance Gait Distance (Feet): 2 Feet Assistive device: 2 person hand held assist Gait Pattern/deviations: Step-to pattern     General Gait Details: side-stepping toward [place]            Wheelchair Mobility    Modified Rankin (Stroke Patients Only)       Balance Overall balance assessment: Needs assistance Sitting-balance support: Bilateral upper extremity supported;Feet supported Sitting balance-Leahy Scale: Poor   Postural control: Posterior lean Standing balance support: Bilateral upper extremity supported Standing balance-Leahy Scale: Poor Standing balance comment: flexed posture                             Pertinent Vitals/Pain Pain Assessment: 0-10 Pain Score: 8  Pain Location: back Pain Descriptors / Indicators: Dull;Grimacing;Guarding Pain Intervention(s): Monitored during session;Premedicated before session;Limited activity within patient's tolerance    Home Living Family/patient expects to be discharged to:: Private residence Living Arrangements: Spouse/significant other Available Help at Discharge: Family Type of Home: House Home Access: Ramped entrance     Home Layout: One level Home Equipment: Environmental consultant - 2 wheels;Bedside commode Additional Comments: daughter can help PRN    Prior Function Level of Independence: Independent               Hand Dominance   Dominant Hand: Right    Extremity/Trunk Assessment   Upper Extremity Assessment Upper Extremity Assessment: Defer to OT evaluation    Lower Extremity Assessment Lower Extremity Assessment: Generalized weakness (difficult to assess due to nausea, near vomiting)    Cervical / Trunk Assessment Cervical / Trunk Assessment: Other exceptions Cervical /  Trunk Exceptions: s/p surg  Communication   Communication: HOH  Cognition Arousal/Alertness: Lethargic Behavior During Therapy: Flat  affect Overall Cognitive Status: Impaired/Different from baseline Area of Impairment: Orientation;Attention;Awareness;Safety/judgement                 Orientation Level: Disoriented to;Time;Situation Current Attention Level: Sustained     Safety/Judgement: Decreased awareness of safety;Decreased awareness of deficits Awareness: Intellectual   General Comments: pt very fixated on nausea and vestibular deficits present.      General Comments General comments (skin integrity, edema, etc.): noted to have bruising on back of head. As rolling pt to her right, she called out "No, don't take me to the right and reported onset of dizziness and nausea. Noted rt rotating, upbeat nystagmus lasting <60 seconds (pt clamped eyes shut, however movement of eyelids lasted ~15 seconds. Became very nauseated with dry heaving    Exercises Other Exercises Other Exercises: pt demonstrates BPPV and noted to have R beating nystagmus. pt states "dont take me to the Right" showing awareness to onset   Assessment/Plan    PT Assessment Patient needs continued PT services  PT Problem List Decreased strength;Decreased activity tolerance;Decreased balance;Decreased mobility;Decreased knowledge of use of DME;Decreased safety awareness;Decreased knowledge of precautions;Obesity;Pain       PT Treatment Interventions DME instruction;Gait training;Functional mobility training;Therapeutic activities;Therapeutic exercise;Balance training;Neuromuscular re-education;Cognitive remediation;Patient/family education;Other (comment) (cannalith repositioning)    PT Goals (Current goals can be found in the Care Plan section)  Acute Rehab PT Goals Patient Stated Goal: to lay down PT Goal Formulation: Patient unable to participate in goal setting Time For Goal Achievement: 11/10/19 Potential to Achieve Goals: Good    Frequency Min 5X/week   Barriers to discharge        Co-evaluation PT/OT/SLP  Co-Evaluation/Treatment: Yes Reason for Co-Treatment: Complexity of the patient's impairments (multi-system involvement);For patient/therapist safety PT goals addressed during session: Mobility/safety with mobility;Balance OT goals addressed during session: ADL's and self-care;Proper use of Adaptive equipment and DME;Strengthening/ROM       AM-PAC PT "6 Clicks" Mobility  Outcome Measure Help needed turning from your back to your side while in a flat bed without using bedrails?: A Lot Help needed moving from lying on your back to sitting on the side of a flat bed without using bedrails?: A Lot Help needed moving to and from a bed to a chair (including a wheelchair)?: Total Help needed standing up from a chair using your arms (e.g., wheelchair or bedside chair)?: Total Help needed to walk in hospital room?: Total Help needed climbing 3-5 steps with a railing? : Total 6 Click Score: 8    End of Session Equipment Utilized During Treatment: Back brace Activity Tolerance: Treatment limited secondary to medical complications (Comment) (BPPV, Nausea) Patient left: in bed;with call bell/phone within reach;with bed alarm set;with nursing/sitter in room;with family/visitor present Nurse Communication: Mobility status;Other (comment) (vertigo with rolling rt) PT Visit Diagnosis: Muscle weakness (generalized) (M62.81);Difficulty in walking, not elsewhere classified (R26.2);BPPV;Pain BPPV - Right/Left : Right Pain - Right/Left:  (back) Pain - part of body:  (back)    Time: 3614-4315 PT Time Calculation (min) (ACUTE ONLY): 30 min   Charges:   PT Evaluation $PT Eval Moderate Complexity: 1 Mod           Arby Barrette, PT Pager [phone]   Rexanne Mano 10/27/2019, 4:49 PM

## 2019-10-27 NOTE — Progress Notes (Signed)
Patient ID: Breanna Shields, female   DOB: [birth year], 79 y.o.   MRN: [id number] BP (!) 142/65 (BP Location: Left Arm)   Pulse 99   Temp 98 F (36.7 C) (Axillary)   Resp 19   Ht 5\' 3"  (1.6 m)   Wt 107.7 kg   SpO2 100%   BMI 42.06 kg/m  Alert and oriented x 4. Very anxious. Moving all extremities well Wound is clean and dry Agree with CIR Doing well postop

## 2019-10-28 ENCOUNTER — Encounter (HOSPITAL_COMMUNITY): Payer: Self-pay | Admitting: Neurosurgery

## 2019-10-28 LAB — COMPREHENSIVE METABOLIC PANEL
ALT: 24 U/L (ref 0–44)
AST: 27 U/L (ref 15–41)
Albumin: 2.7 g/dL — ABNORMAL LOW (ref 3.5–5.0)
Alkaline Phosphatase: 65 U/L (ref 38–126)
Anion gap: 9 (ref 5–15)
BUN: 15 mg/dL (ref 8–23)
CO2: 24 mmol/L (ref 22–32)
Calcium: 8.6 mg/dL — ABNORMAL LOW (ref 8.9–10.3)
Chloride: 108 mmol/L (ref 98–111)
Creatinine, Ser: 0.76 mg/dL (ref 0.44–1.00)
GFR calc Af Amer: >60 mL/min (ref >60)
GFR calc non Af Amer: >60 mL/min (ref >60)
Glucose, Bld: 164 mg/dL — ABNORMAL HIGH (ref 70–99)
Potassium: 4 mmol/L (ref 3.5–5.1)
Sodium: 141 mmol/L (ref 135–145)
Total Bilirubin: 0.9 mg/dL (ref 0.3–1.2)
Total Protein: 5.9 g/dL — ABNORMAL LOW (ref 6.5–8.1)

## 2019-10-28 LAB — URINE CULTURE: Culture: >=100000 — AB

## 2019-10-28 LAB — GLUCOSE, CAPILLARY
Glucose-Capillary: 165 mg/dL — ABNORMAL HIGH (ref 70–99)
Glucose-Capillary: 176 mg/dL — ABNORMAL HIGH (ref 70–99)
Glucose-Capillary: 194 mg/dL — ABNORMAL HIGH (ref 70–99)
Glucose-Capillary: 171 mg/dL — ABNORMAL HIGH (ref 70–99)

## 2019-10-28 LAB — CBC
HCT: 32.6 % — ABNORMAL LOW (ref 36.0–46.0)
Hemoglobin: 10.1 g/dL — ABNORMAL LOW (ref 12.0–15.0)
MCH: 30.1 pg (ref 26.0–34.0)
MCHC: 31 g/dL (ref 30.0–36.0)
MCV: 97 fL (ref 80.0–100.0)
Platelets: 224 10*3/uL (ref 150–400)
RBC: 3.36 MIL/uL — ABNORMAL LOW (ref 3.87–5.11)
RDW: 13.3 % (ref 11.5–15.5)
WBC: 17.4 10*3/uL — ABNORMAL HIGH (ref 4.0–10.5)
nRBC: 0 % (ref 0.0–0.2)

## 2019-10-28 MED ORDER — METOPROLOL TARTRATE 5 MG/5ML IV SOLN
2.5000 mg | Freq: Four times a day (QID) | INTRAVENOUS | Status: DC | PRN
  Administered 2019-10-28 – 2019-10-29 (×2): 2.5 mg via INTRAVENOUS
  Filled 2019-10-28 (×2): qty 5

## 2019-10-28 MED ORDER — SODIUM CHLORIDE 0.9 % IV SOLN
1.0000 g | INTRAVENOUS | Status: AC
  Administered 2019-10-28 – 2019-10-30 (×3): 1 g via INTRAVENOUS
  Filled 2019-10-28 (×3): qty 10

## 2019-10-28 MED ORDER — HALOPERIDOL LACTATE 5 MG/ML IJ SOLN
1.0000 mg | Freq: Four times a day (QID) | INTRAMUSCULAR | Status: DC | PRN
  Administered 2019-10-28 – 2019-11-02 (×4): 1 mg via INTRAVENOUS
  Filled 2019-10-28 (×4): qty 1

## 2019-10-28 MED ORDER — METOPROLOL TARTRATE 25 MG PO TABS
25.0000 mg | ORAL_TABLET | Freq: Two times a day (BID) | ORAL | Status: DC
  Administered 2019-10-28 – 2019-10-29 (×4): 25 mg via ORAL
  Filled 2019-10-28 (×5): qty 1

## 2019-10-28 NOTE — Progress Notes (Signed)
1030: Bladder scan performed on patient this morning per order. The amount shown on scan was 283 mL. Patient tolerated procedure well but complained of bladder pain and discomfort.   Message sent to the MD and orders for a 1x in and out cath received.   1045: Using a sterile technique for bladder residual, in and out cath performed using a 16 fr cath. Obtained 750 mL.   Patient tolerated procedure well and reported bladder relief. Urine volume charted in flowsheets.

## 2019-10-28 NOTE — Progress Notes (Signed)
1830: Pt re-bladder scanned per orders with 239 mL seen on scan. Per MD order, okay to proceed with foley insertion for the inability to void.   1845: With staff assistance, 14 fr non-latex foley was inserted at the bedside by Davy Pique, [place]. Sterile technique was applied and maintained throughout the procedure. Pt. Tolerated the procedure well.   Initial residual drainage was > 400 mL of urine was drained into foley bag.   Orders to keep foley d/t retention and immobility placed.

## 2019-10-28 NOTE — Progress Notes (Addendum)
[facility] PROGRESS NOTE   Breanna Shields MWN:[id number] DOB: 06/18/1940 DOA: 10/24/2019  PCP: Lorne Skeens, MD  Brief History/Interval Summary: 79 y.o. female with medical history significant of hypertension, hyperlipidemia, type 2 diabetes mellitus, morbid obesity with BMI of 41 presented to emergency department for evaluation of back pain after fall.  Evaluation in the ED revealed T9 vertebral body fracture.  Neurosurgery was consulted.  Patient was hospitalized for further management.  Reason for Visit: T9 vertebral body fracture  Consultants: Neurosurgery  Procedures: 9/19: THORACIC SEVEN-THORACIC ELEVEN PERCUTANEOUS PEDICLE SCREW PLACEMENT, ORIF T9 fracture with segmental pedicle screw fixation T7-T11  Antibiotics: Anti-infectives (From admission, onward)   Start     Dose/Rate Route Frequency Ordered Stop   10/28/19 0900  cefTRIAXone (ROCEPHIN) 1 g in sodium chloride 0.9 % 100 mL IVPB        1 g 200 mL/hr over 30 Minutes Intravenous Every 24 hours 10/28/19 0823 10/31/19 0859   10/26/19 1600  cefTRIAXone (ROCEPHIN) 1 g in sodium chloride 0.9 % 100 mL IVPB        1 g 200 mL/hr over 30 Minutes Intravenous Every 24 hours 10/26/19 1008 10/27/19 1731   10/26/19 1113  sodium chloride 0.9 % with cefTRIAXone (ROCEPHIN) ADS Med       Note to Pharmacy: Bobbie Stack   : cabinet override      10/26/19 1113 10/26/19 2329      Subjective/Interval History: Patient is more awake alert today.  No chest pain shortness of breath continues to have back pain.  Able to move her arms and legs.  Has been noted to urinate overnight.  She does admit to some burning sensation with urination.      Assessment/Plan:  T9 vertebral fracture This is secondary to a fall.  Neurosurgery was consulted.  Patient underwent surgery on 9/19.  Pain control.  Bowel regimen.  PT and OT evaluation.    Hypoxia She was noted to be hypoxic after her surgery.  Currently on nasal cannula.  Does not use  oxygen at home.  No diagnosis of sleep apnea.  Chest x-ray showed atelectasis.  Continue with incentive spirometer.  Wean down oxygen as tolerated.  Stable from a respiratory standpoint.  Essential hypertension Elevated blood pressure most likely due to pain.  Continue ARB.  May need to add additional agents but will monitor for now.  Continue hydralazine as needed.  Could consider adding beta-blocker if blood pressure remains elevated.  Hyperlipidemia Continue statin.  History of depression and anxiety Continue Lexapro.  Normocytic anemia/leukocytosis No evidence of overt bleeding.  Hemoglobin is stable.  Leukocytosis noted yesterday.  Reason for this is not entirely clear.  We will recheck labs.  Diabetes mellitus type 2 HbA1c 7.9.  Monitor CBGs.  SSI.  Holding Actos and Trulicity.  CBGs are reasonably well controlled.  UTI with E. coli and Klebsiella/Urinary Retention UA did show moderate leukocytes and positive nitrite.  Many bacteria 11-20 WBC.  Patient does admit to dysuria today.    Urine culture report reviewed.  Sensitivities reviewed. Extend treatment with ceftriaxone to 5 days. Required in and out cath with 755ml urine. Monitor for now. She is retaining due to immobility.  Severe obesity Estimated body mass index is 42.06 kg/m as calculated from the following:   Height as of this encounter: 5\' 3"  (1.6 m).   Weight as of this encounter: 107.7 kg.   DVT Prophylaxis: SQ heparin Code Status: Full code Family Communication: Discussed with the patient  Disposition Plan: CIR recommended by PT and OT  Status is: Inpatient  Remains inpatient appropriate because:Ongoing active pain requiring inpatient pain management, IV treatments appropriate due to intensity of illness or inability to take PO and Inpatient level of care appropriate due to severity of illness   Dispo: The patient is from: Home              Anticipated d/c is to: CIR              Anticipated d/c date is: 2  days              Patient currently is not medically stable to d/c.      Medications:  Scheduled: . B-complex with vitamin C  1 tablet Oral Daily  . celecoxib  200 mg Oral Q12H  . Chlorhexidine Gluconate Cloth  6 each Topical Daily  . cholecalciferol  2,000 Units Oral Daily  . escitalopram  10 mg Oral Daily  . heparin injection (subcutaneous)  5,000 Units Subcutaneous Q8H  . insulin aspart  0-15 Units Subcutaneous TID WC  . insulin aspart  0-5 Units Subcutaneous QHS  . irbesartan  300 mg Oral Daily  . multivitamin with minerals  1 tablet Oral Daily  . pioglitazone  30 mg Oral Daily  . senna  1 tablet Oral BID  . sodium chloride flush  3 mL Intravenous Q12H   Continuous: . sodium chloride    . 0.9 % NaCl with KCl 20 mEq / L 50 mL/hr at 10/28/19 0850  . cefTRIAXone (ROCEPHIN)  IV     VOJ:[id number] **OR** acetaminophen, bisacodyl, diazepam, hydrALAZINE, HYDROcodone-acetaminophen, magnesium citrate, meclizine, menthol-cetylpyridinium **OR** phenol, morphine injection, ondansetron **OR** ondansetron (ZOFRAN) IV, senna-docusate, sodium chloride flush, zolpidem   Objective:  Vital Signs  Vitals:   10/27/19 1946 10/27/19 2346 10/28/19 0355 10/28/19 0739  BP: (!) 152/65 (!) 155/88 (!) 163/82 (!) 171/79  Pulse: (!) 106 (!) 105 (!) 109 (!) 123  Resp: 20 19 18 18   Temp: 98.7 F (37.1 C) 99.5 F (37.5 C) 98.3 F (36.8 C) 98.9 F (37.2 C)  TempSrc: Oral Oral  Oral  SpO2: 95% 95% 96% 97%  Weight:      Height:       No intake or output data in the 24 hours ending 10/28/19 0921 Filed Weights   10/24/19 2341 10/24/19 2349 10/26/19 0316  Weight: 107.5 kg 107.5 kg 107.7 kg    General appearance: Awake alert.  In no distress Resp: Normal effort at rest.  Diminished air entry at the bases.  No wheezing or rhonchi. Cardio: S1-S2 is normal regular.  No S3-S4.  No rubs murmurs or bruit GI: Abdomen is soft.  Nontender nondistended.  Bowel sounds are present normal.  No masses  organomegaly Extremities: No edema.  Moving all her extremities. Neurologic: Alert and oriented x3.  No focal neurological deficits.     Lab Results:  Data Reviewed: I have personally reviewed following labs and imaging studies  CBC: Recent Labs  Lab 10/25/19 0058 10/26/19 0705 10/27/19 1046  WBC 12.3* 10.7* 19.7*  HGB 11.1* 11.3* 10.4*  HCT 36.7 36.5 33.9*  MCV 99.5 97.9 96.0  PLT 251 230 169    Basic Metabolic Panel: Recent Labs  Lab 10/25/19 0058 10/25/19 1400 10/26/19 0705 10/27/19 1046  NA 139  --  139 139  K 4.2  --  3.9 3.8  CL 104  --  104 106  CO2 26  --  27 24  GLUCOSE 171*  --  167* 164*  BUN 23  --  13 14  CREATININE 1.00  --  0.74 0.70  CALCIUM 8.7*  --  8.9 8.8*  MG  --  1.8  --   --   PHOS  --  2.4*  --   --     GFR: Estimated Creatinine Clearance: 68.2 mL/min (by C-G formula based on SCr of 0.7 mg/dL).  Liver Function Tests: Recent Labs  Lab 10/26/19 0705 10/27/19 1046  AST 17 25  ALT 20 24  ALKPHOS 68 62  BILITOT 0.6 0.7  PROT 6.3* 6.1*  ALBUMIN 3.0* 2.9*    HbA1C: Recent Labs    10/25/19 1403  HGBA1C 7.9*    CBG: Recent Labs  Lab 10/27/19 0859 10/27/19 1237 10/27/19 1609 10/27/19 2102 10/28/19 0600  GLUCAP 153* 156* 165* 132* 165*     Thyroid Function Tests: Recent Labs    10/25/19 1543  TSH 1.471     Recent Results (from the past 240 hour(s))  SARS Coronavirus 2 by RT PCR (hospital order, performed in [place] hospital lab) Nasopharyngeal Nasopharyngeal Swab     Status: None   Collection Time: 10/25/19  7:38 AM   Specimen: Nasopharyngeal Swab  Result Value Ref Range Status   SARS Coronavirus 2 NEGATIVE NEGATIVE Final    Comment: (NOTE) SARS-CoV-2 target nucleic acids are NOT DETECTED.  The SARS-CoV-2 RNA is generally detectable in upper and lower respiratory specimens during the acute phase of infection. The lowest concentration of SARS-CoV-2 viral copies this assay can detect is 250 copies / mL. A  negative result does not preclude SARS-CoV-2 infection and should not be used as the sole basis for treatment or other patient management decisions.  A negative result may occur with improper specimen collection / handling, submission of specimen other than nasopharyngeal swab, presence of viral mutation(s) within the areas targeted by this assay, and inadequate number of viral copies (<250 copies / mL). A negative result must be combined with clinical observations, patient history, and epidemiological information.  Fact Sheet for Patients:   StrictlyIdeas.no  Fact Sheet for Healthcare Providers: BankingDealers.co.za  This test is not yet approved or  cleared by the [place] FDA and has been authorized for detection and/or diagnosis of SARS-CoV-2 by FDA under an Emergency Use Authorization (EUA).  This EUA will remain in effect (meaning this test can be used) for the duration of the COVID-19 declaration under Section 564(b)(1) of the Act, 21 U.S.C. section 360bbb-3(b)(1), unless the authorization is terminated or revoked sooner.  Performed at [facility], [place] [address], [place], [place] [address]   Surgical pcr screen     Status: None   Collection Time: 10/25/19 10:01 PM   Specimen: Nasal Mucosa; Nasal Swab  Result Value Ref Range Status   MRSA, PCR NEGATIVE NEGATIVE Final   Staphylococcus aureus NEGATIVE NEGATIVE Final    Comment: (NOTE) The Xpert SA Assay (FDA approved for NASAL specimens in patients 75 years of age and older), is one component of a comprehensive surveillance program. It is not intended to diagnose infection nor to guide or monitor treatment. Performed at [facility], [place] [address] [place],  [address]   Culture, Urine     Status: Abnormal   Collection Time: 10/26/19 10:09 AM   Specimen: Urine, Random  Result Value Ref Range Status   Specimen Description URINE, RANDOM  Final    Special Requests   Final  A URINE Performed at [facility], [place] [address] [place], [place] [address]    Culture (A)  Final    >=100,000 COLONIES/mL KLEBSIELLA PNEUMONIAE >=100,000 COLONIES/mL ESCHERICHIA COLI    Report Status 10/28/2019 FINAL  Final   Organism ID, Bacteria KLEBSIELLA PNEUMONIAE (A)  Final   Organism ID, Bacteria ESCHERICHIA COLI (A)  Final      Susceptibility   Escherichia coli - MIC*    AMPICILLIN <=2 SENSITIVE Sensitive     CEFAZOLIN <=4 SENSITIVE Sensitive     CEFTRIAXONE <=0.25 SENSITIVE Sensitive     CIPROFLOXACIN <=0.25 SENSITIVE Sensitive     GENTAMICIN <=1 SENSITIVE Sensitive     IMIPENEM <=0.25 SENSITIVE Sensitive     NITROFURANTOIN <=16 SENSITIVE Sensitive     TRIMETH/SULFA <=20 SENSITIVE Sensitive     AMPICILLIN/SULBACTAM <=2 SENSITIVE Sensitive     PIP/TAZO <=4 SENSITIVE Sensitive     * >=100,000 COLONIES/mL ESCHERICHIA COLI   Klebsiella pneumoniae - MIC*    AMPICILLIN >=32 RESISTANT Resistant     CEFAZOLIN <=4 SENSITIVE Sensitive     CEFTRIAXONE <=0.25 SENSITIVE Sensitive     CIPROFLOXACIN <=0.25 SENSITIVE Sensitive     GENTAMICIN <=1 SENSITIVE Sensitive     IMIPENEM <=0.25 SENSITIVE Sensitive     NITROFURANTOIN <=16 SENSITIVE Sensitive     TRIMETH/SULFA <=20 SENSITIVE Sensitive     AMPICILLIN/SULBACTAM 8 SENSITIVE Sensitive     PIP/TAZO <=4 SENSITIVE Sensitive     * >=100,000 COLONIES/mL KLEBSIELLA PNEUMONIAE      Radiology Studies: DG Chest 1 View  Result Date: 10/26/2019 CLINICAL DATA:  Central line placement EXAM: CHEST  1 VIEW COMPARISON:  10/25/2019 FINDINGS: Left jugular central venous catheter tip in the mid SVC. No pneumothorax. Heart size prominent. Negative for heart failure or edema. Mild atelectasis bilaterally. No effusion Interval placement of thoracic spine fusion hardware in the mid and lower thoracic spine. IMPRESSION: Satisfactory central line placement Mild bibasilar atelectasis.  Is Electronically Signed   By:  Franchot Gallo M.D.   On: 10/26/2019 14:35   DG Thoracic Spine 2 View  Result Date: 10/26/2019 CLINICAL DATA:  Thoracic spine fracture fixation EXAM: THORACIC SPINE 2 VIEWS; DG C-ARM 1-60 MIN COMPARISON:  MRI thoracic spine 10/25/2019 FINDINGS: AP and lateral C-arm images of the thoracic spine demonstrate bilateral pedicle screw and rod fusion in the lower thoracic spine. Accurate level determination is not possible on these limited images. IMPRESSION: Bilateral pedicle screw and rod fusion lower thoracic spine. Electronically Signed   By: Franchot Gallo M.D.   On: 10/26/2019 14:34   DG C-Arm 1-60 Min  Result Date: 10/26/2019 CLINICAL DATA:  Thoracic spine fracture fixation EXAM: THORACIC SPINE 2 VIEWS; DG C-ARM 1-60 MIN COMPARISON:  MRI thoracic spine 10/25/2019 FINDINGS: AP and lateral C-arm images of the thoracic spine demonstrate bilateral pedicle screw and rod fusion in the lower thoracic spine. Accurate level determination is not possible on these limited images. IMPRESSION: Bilateral pedicle screw and rod fusion lower thoracic spine. Electronically Signed   By: Franchot Gallo M.D.   On: 10/26/2019 14:34       LOS: 3 days   [facility] Pager on [email or web address]  10/28/2019, 9:21 AM

## 2019-10-28 NOTE — Progress Notes (Signed)
Physical Therapy Treatment Patient Details Name: Breanna Shields MRN: [id number] DOB: [birth year] Today's Date: 10/28/2019    History of Present Illness 79 yo female from home s/p fall hitting head backward. pt underwent T7-11 pedicule screw placement ORIF t9 fx  PMH DM2, HLD, HTN, R revision TKA,BIL TKA,    PT Comments    Session focused on treating rt posterior canal BPPV (noted rt upbeating rt-rotating nystagmus with rolling to rt 9/20 session). RN premedicated patient with zofran and meclizine due to pt's severe symptoms on 9/20 which prevented treatment. Patient very drowsy/lethargic during session, however able to assist with moving as instructed. Able to complete rt Epley maneuver x 2 with +2 assist to maintain back precautions. Symptoms improved after 1st Epley.   Daughter present throughout and answered all of her questions re: patient's current status, BPPV, and discharge planning (difficult to assess ability to tolerate CIR due to premedication for nausea & vertigo, however daughter reports pt was very sedentary PTA and does not anticipate her father being able to care for her mother after a 2 week stay on CIR). Daughter very concerned re: pt's cognitive decline BEFORE she was given zofran/meclizine and engaged RN in this discussion. Confirmed for RN that pt did not have any focal signs. RN assured daughter that she would contact MD re: cognitive changes.    Follow Up Recommendations  CIR;Supervision/Assistance - 24 hour (difficult to assess due to med effects; focus on BPPV)     Equipment Recommendations  Other (comment) (TBD)    Recommendations for Other Services       Precautions / Restrictions Precautions Precautions: Back Precaution Comments: maintained while treating BPPV with use of 2 person assist and bed pad    Vestibular Assessment      10/28/19 1649  Positional Testing  Dix-Hallpike Dix-Hallpike Right (x2; symptoms lasted ~15 seconds 2nd time)  Dix-Hallpike  Right  Dix-Hallpike Right Duration 45 sec  Dix-Hallpike Right Symptoms Other (comment) (nystagmus suppressed; pt verbalized spinning)   Vestibular treatment   10/28/19 0001  Vestibular Treatment/Exercise  Vestibular Treatment Provided Canalith Repositioning  Canalith Repositioning Epley Manuever Right   EPLEY MANUEVER RIGHT  Number of Reps  2  Overall Response Improved Symptoms  Response Details  Used trendelenburg feature of hospital bed to maintain back precautions and get inner ear tipped back ~20 degrees (pillow under shoulders to incr neck extension) Patient improved with lesser symptoms on 2nd rep,however became agitated and began waving arms, pushing therapist away in position 2 and lost her head position. Pt calmed and able to continue maneuver, however ?effectivenss of 2nd rep (pt too upset to repeat Hallpike-Dix a 3rd time.    Mobility  Bed Mobility Overal bed mobility: Needs Assistance Bed Mobility: Rolling;Sidelying to Sit;Sit to Sidelying Rolling: Max assist;+2 for physical assistance Sidelying to sit: Max assist;+2 for physical assistance     Sit to sidelying: Max assist;+2 for physical assistance General bed mobility comments: required incr assist due to lethargy and confusion  Transfers                    Ambulation/Gait                 Stairs             Wheelchair Mobility    Modified Rankin (Stroke Patients Only)       Balance  Cognition Arousal/Alertness: Lethargic;Suspect due to medications (pre-medicated for nausea and dizziness) Behavior During Therapy: Flat affect Overall Cognitive Status: Impaired/Different from baseline Area of Impairment: Orientation;Attention;Awareness;Safety/judgement                 Orientation Level: Place;Situation (time NT) Current Attention Level: Focused     Safety/Judgement: Decreased awareness of safety;Decreased  awareness of deficits     General Comments: Per daughter was confused and trying to get OOB prior to zofran; was seeing a red couch that she wanted dtr to "flip over." During session thought she was at [facility].       Exercises      General Comments General comments (skin integrity, edema, etc.): focused on treating BPPV; daughter present and lengthy discussion re: patient's confusion, her prior functional status (very sedentary), BPPV, and discharge planning      Pertinent Vitals/Pain Pain Assessment: Faces Faces Pain Scale: Hurts little more Pain Location: back Pain Descriptors / Indicators: Discomfort;Guarding Pain Intervention(s): Limited activity within patient's tolerance;Monitored during session;Repositioned    Home Living                      Prior Function            PT Goals (current goals can now be found in the care plan section) Acute Rehab PT Goals Patient Stated Goal: none stated-pt had zofran and meclizine prior to session and lethargic Time For Goal Achievement: 11/10/19 Potential to Achieve Goals: Good Progress towards PT goals: Progressing toward goals (indirectly-addressing BPPV to tolerate incr mobilty)    Frequency    Min 5X/week      PT Plan Other (comment) (difficult to assess due to meds prior to treatment)    Co-evaluation              AM-PAC PT "6 Clicks" Mobility   Outcome Measure  Help needed turning from your back to your side while in a flat bed without using bedrails?: A Lot Help needed moving from lying on your back to sitting on the side of a flat bed without using bedrails?: A Lot Help needed moving to and from a bed to a chair (including a wheelchair)?: Total Help needed standing up from a chair using your arms (e.g., wheelchair or bedside chair)?: Total Help needed to walk in hospital room?: Total Help needed climbing 3-5 steps with a railing? : Total 6 Click Score: 8    End of Session   Activity  Tolerance: Treatment limited secondary to medical complications (Comment) (nausea and vertigo) Patient left: in bed;with call bell/phone within reach;with bed alarm set;with family/visitor present Nurse Communication: Other (comment) (dtr's concern re: incr confusion prior to meds) BPPV - Right/Left : Right Pain - Right/Left:  (center) Pain - part of body:  (back)     Time: 6160-7371 PT Time Calculation (min) (ACUTE ONLY): 62 min  Charges:  $Self Care/Home Management: 38-52 $Canalith Rep Proc: 8-22 mins                      Arby Barrette, PT Pager [phone]    Rexanne Mano 10/28/2019, 4:55 PM

## 2019-10-28 NOTE — Progress Notes (Signed)
Patient ID: Breanna Shields, female   DOB: 12-26-1940, 79 y.o.   MRN: [id number] BP (!) 141/80 (BP Location: Left Arm)   Pulse 88   Temp 97.6 F (36.4 C) (Axillary)   Resp 18   Ht 5\' 3"  (1.6 m)   Wt 107.7 kg   SpO2 95%   BMI 42.06 kg/m  Alert, will follow commands, will say her name, location, year. Also confused in her behavior.  Moving all extremities Has a uti which is being treated.

## 2019-10-28 NOTE — TOC CAGE-AID Note (Signed)
Transition of Care [facility]) - CAGE-AID Screening   Patient Details  Name: Breanna Shields MRN: [id number] Date of Birth: 07/23/1940  Transition of [facility] CM/SW Contact:    Emeterio Reeve, [place] Phone Number: 10/28/2019, 1:25 PM   Clinical Narrative:  CSW met with pt at bedside. CSW introduced self and explained her role at the hospital.  PT denies alcohol use and substance use. Pt did not need any resources at this time.    CAGE-AID Screening:    Have You Ever Felt You Ought to Cut Down on Your Drinking or Drug Use?: No Have People Annoyed You By Critizing Your Drinking Or Drug Use?: No Have You Felt Bad Or Guilty About Your Drinking Or Drug Use?: No Have You Ever Had a Drink or Used Drugs First Thing In The Morning to Steady Your Nerves or to Get Rid of a Hangover?: No CAGE-AID Score: 0    Blima Ledger, [place] Social Worker [phone]

## 2019-10-28 NOTE — Progress Notes (Signed)
1708: Pt. Experiencing increased agitation and new onset of confusion reported and observed by family at the bedside.   Message sent to MD for PRN agitation orders. Halidol I mg IV ordered and given.

## 2019-10-29 DIAGNOSIS — R410 Disorientation, unspecified: Secondary | ICD-10-CM

## 2019-10-29 LAB — COMPREHENSIVE METABOLIC PANEL
ALT: 28 U/L (ref 0–44)
AST: 29 U/L (ref 15–41)
Albumin: 2.7 g/dL — ABNORMAL LOW (ref 3.5–5.0)
Alkaline Phosphatase: 69 U/L (ref 38–126)
Anion gap: 11 (ref 5–15)
BUN: 16 mg/dL (ref 8–23)
CO2: 23 mmol/L (ref 22–32)
Calcium: 9.2 mg/dL (ref 8.9–10.3)
Chloride: 106 mmol/L (ref 98–111)
Creatinine, Ser: 0.79 mg/dL (ref 0.44–1.00)
GFR calc Af Amer: >60 mL/min (ref >60)
GFR calc non Af Amer: >60 mL/min (ref >60)
Glucose, Bld: 231 mg/dL — ABNORMAL HIGH (ref 70–99)
Potassium: 4 mmol/L (ref 3.5–5.1)
Sodium: 140 mmol/L (ref 135–145)
Total Bilirubin: 1.1 mg/dL (ref 0.3–1.2)
Total Protein: 6.4 g/dL — ABNORMAL LOW (ref 6.5–8.1)

## 2019-10-29 LAB — CBC
HCT: 31.8 % — ABNORMAL LOW (ref 36.0–46.0)
Hemoglobin: 9.9 g/dL — ABNORMAL LOW (ref 12.0–15.0)
MCH: 29.9 pg (ref 26.0–34.0)
MCHC: 31.1 g/dL (ref 30.0–36.0)
MCV: 96.1 fL (ref 80.0–100.0)
Platelets: 236 10*3/uL (ref 150–400)
RBC: 3.31 MIL/uL — ABNORMAL LOW (ref 3.87–5.11)
RDW: 13.4 % (ref 11.5–15.5)
WBC: 13.9 10*3/uL — ABNORMAL HIGH (ref 4.0–10.5)
nRBC: 0 % (ref 0.0–0.2)

## 2019-10-29 LAB — GLUCOSE, CAPILLARY
Glucose-Capillary: 204 mg/dL — ABNORMAL HIGH (ref 70–99)
Glucose-Capillary: 180 mg/dL — ABNORMAL HIGH (ref 70–99)
Glucose-Capillary: 193 mg/dL — ABNORMAL HIGH (ref 70–99)
Glucose-Capillary: 104 mg/dL — ABNORMAL HIGH (ref 70–99)

## 2019-10-29 MED ORDER — DILTIAZEM HCL 25 MG/5ML IV SOLN
15.0000 mg | Freq: Once | INTRAVENOUS | Status: AC
  Administered 2019-10-30: 15 mg via INTRAVENOUS
  Filled 2019-10-29: qty 5

## 2019-10-29 NOTE — Progress Notes (Signed)
Pt.s MEw score remained at 2 r/t  High BP and HR  110/ bpm but pt refused Vs, rechecked MD made aware IV haldol 1 mg given.

## 2019-10-29 NOTE — Progress Notes (Signed)
Physical Therapy Treatment Patient Details Name: Breanna Shields MRN: [id number] DOB: [birth year] Today's Date: 10/29/2019    History of Present Illness 79 yo female from home s/p fall backward hitting head and T9 fracture. pt underwent T7-11 pedicule screw placement ORIF t9 fx; on PT/OT eval noted +rt posterior canal BPPV PMH DM2, HLD, HTN, R revision TKA,BIL TKA,    PT Comments    Patient anxious and confused (pt lying in bed, repeating "let me lie down. Let me get back in bed."), however able to state location, reason for admission, injury sustained, month. Focus on mobility this session with pt immediately stating she needed to have a BM and needed to go to the bathroom. Pt moves impulsively and requires max cues to maintain back precautions. Room set up for exit to left side of her bed and no vertigo triggered or nystagmus noted with roll to left (previously, rolling rt triggered BPPV). Pt did report she was light-headed but denied spinning. Able to progress to [facility] and then to recliner with min assist +1 for safety due to decr cognition. Noted daughter has expressed concern over whether pt will be able to discharge home with her husband after short (~2 week) CIR stay; however pt with very good progress this date and will continue to monitor for appropriate discharge plan.     Follow Up Recommendations  CIR;Supervision/Assistance - 24 hour     Equipment Recommendations  Other (comment) (TBD)    Recommendations for Other Services Rehab consult     Precautions / Restrictions Precautions Precautions: Back Precaution Booklet Issued:  (given previously) Precaution Comments: pt unable to state due to confusion; cues and guidance to maintain during session Required Braces or Orthoses: Spinal Brace Spinal Brace: Thoracolumbosacral orthotic;Applied in sitting position    Mobility  Bed Mobility Overal bed mobility: Needs Assistance Bed Mobility: Rolling;Sidelying to Sit Rolling: +2 for  physical assistance;Mod assist Sidelying to sit: +2 for physical assistance;Mod assist       General bed mobility comments: required incr assist due to confusion  Transfers Overall transfer level: Needs assistance   Transfers: Sit to/from Stand Sit to Stand: Min assist;+2 safety/equipment         General transfer comment: stood from bed and BSC with max cues for hand placement and safe use of Rw  Ambulation/Gait Ambulation/Gait assistance: Min assist;+2 safety/equipment Gait Distance (Feet): 3 Feet Assistive device: Rolling walker (2 wheeled) Gait Pattern/deviations: Step-to pattern     General Gait Details: from [facility] to recliner   Stairs             Wheelchair Mobility    Modified Rankin (Stroke Patients Only)       Balance Overall balance assessment: Needs assistance Sitting-balance support: Bilateral upper extremity supported;Feet supported Sitting balance-Leahy Scale: Poor Sitting balance - Comments: wants to lean forward on elbows on RW and max cues to avoid bending back   Standing balance support: Bilateral upper extremity supported Standing balance-Leahy Scale: Poor Standing balance comment: flexed posture                            Cognition Arousal/Alertness: Lethargic;Suspect due to medications (haldol overnight due to agitation) Behavior During Therapy: Anxious;Impulsive Overall Cognitive Status: Impaired/Different from baseline Area of Impairment: Orientation;Attention;Awareness;Safety/judgement;Following commands;Memory;Problem solving                 Orientation Level:  (states she fell, broke back, is in [facility] hospital) Current Attention Level:  Focused;Sustained Memory: Decreased short-term memory (required repeated explanation that she's lying in bed) Following Commands: Follows one step commands inconsistently Safety/Judgement: Decreased awareness of safety;Decreased awareness of deficits Awareness:  Intellectual Problem Solving: Slow processing;Difficulty sequencing;Requires verbal cues;Requires tactile cues General Comments: Noted confusion overnight, required haldol. Husband present and pt cooperative. At times moving too quickly or not adhering to back precautions and requires redirection/correction      Exercises      General Comments General comments (skin integrity, edema, etc.): Husband present. Appears very frail      Pertinent Vitals/Pain Pain Assessment: Faces Faces Pain Scale: Hurts a little bit Pain Location: back Pain Descriptors / Indicators: Discomfort;Guarding Pain Intervention(s): Limited activity within patient's tolerance;Monitored during session    Home Living                      Prior Function            PT Goals (current goals can now be found in the care plan section) Acute Rehab PT Goals Patient Stated Goal: wants to get back in bed (pt was in bed as stating this) Time For Goal Achievement: 11/10/19 Potential to Achieve Goals: Good Progress towards PT goals: Progressing toward goals    Frequency    Min 5X/week      PT Plan Current plan remains appropriate    Co-evaluation              AM-PAC PT "6 Clicks" Mobility   Outcome Measure  Help needed turning from your back to your side while in a flat bed without using bedrails?: A Lot Help needed moving from lying on your back to sitting on the side of a flat bed without using bedrails?: A Lot Help needed moving to and from a bed to a chair (including a wheelchair)?: A Little Help needed standing up from a chair using your arms (e.g., wheelchair or bedside chair)?: A Little Help needed to walk in hospital room?: A Little Help needed climbing 3-5 steps with a railing? : Total 6 Click Score: 14    End of Session Equipment Utilized During Treatment: Back brace Activity Tolerance: Treatment limited secondary to medical complications (Comment) (confusion,  impulsivity) Patient left: with call bell/phone within reach;with family/visitor present;in chair;with chair alarm set Nurse Communication: Mobility status PT Visit Diagnosis: Muscle weakness (generalized) (M62.81);Difficulty in walking, not elsewhere classified (R26.2);BPPV;Pain BPPV - Right/Left : Right Pain - Right/Left:  (center) Pain - part of body:  (back)     Time: 5830-9407 PT Time Calculation (min) (ACUTE ONLY): 36 min  Charges:  $Therapeutic Activity: 23-37 mins                      Arby Barrette, PT Pager [phone]    Rexanne Mano 10/29/2019, 11:40 AM

## 2019-10-29 NOTE — Progress Notes (Signed)
Patient ID: Breanna Shields, female   DOB: [birth year], 79 y.o.   MRN: [id number] BP (!) (P) 166/91 (BP Location: Right Arm)   Pulse (!) (P) 102   Temp (P) 98.7 F (37.1 C) (Oral)   Resp (P) 20   Ht 5\' 3"  (1.6 m)   Wt 107.7 kg   SpO2 (P) 92%   BMI 42.06 kg/m  Currently sleeping, wound is clean, dry, no signs of infection No new recommendations. Continue to work with physical and occupational therapy

## 2019-10-29 NOTE — Care Management Important Message (Signed)
Important Message  Patient Details  Name: Breanna Shields MRN: [id number] Date of Birth: 09-22-1940   Medicare Important Message Given:  Yes     Orbie Pyo 10/29/2019, 1:49 PM

## 2019-10-29 NOTE — Progress Notes (Signed)
   10/29/19 0508  Vitals  Temp 98.2 F (36.8 C)  Temp Source Oral  BP 140/64  MAP (mmHg) 85  BP Location Right Arm  BP Method Automatic  Patient Position (if appropriate) Lying  Pulse Rate 79  Resp 20  MEWS COLOR  MEWS Score Color Green  Oxygen Therapy  SpO2 96 %  O2 Device Nasal Cannula  FiO2 (%) (!) 2 %  MEWS Score  MEWS Temp 0  MEWS Systolic 0  MEWS Pulse 0  MEWS RR 0  MEWS LOC 0  MEWS Score 0

## 2019-10-29 NOTE — Progress Notes (Signed)
Inpatient Rehab Admissions Coordinator:   I have spoken with Pt., her husband and daughter regarding potential CIR consults. Both Pt. And her daughter expressed concern that Pt. Would be unwilling or unable to do 3 hours of therapy per day and that she will need more assistance than Pt.'s husband can provide after her stay in CIR. I share this concern and believe discharge to SNF is more appropriate at this time.  CIR will sign off. Pt.'s husband shared that he does not want Pt. To go to [facility]. He stated interest in [place] [facility] and Pt.'s daughter would like to look into [facility] in [place]. I will pass this on to case manager.   Clemens Catholic, [place], [place] Admissions Coordinator  [phone] ([place]) [phone] (office)

## 2019-10-29 NOTE — Progress Notes (Signed)
Patient was seen for a fib with RVR with rate reaching 160s. She had prn IV Lopressor ordered and has already been given that without much effect.   On chart review, do not see documentation of a fib prior to this admission but looks like on tele she has been in and out of a fib last couple days and has been rapid at times.   Patient appears uncomfortable, complaining of back pain but not chest pain. She is not in resp distress and BP is stable/elevated.   EKG was obtained and confirms AF with RVR.   She was given diltiazem 15 mg IV with improvement in HR to 110s-130s.   Plan to treat her pain now, change level of care to progressive, continue close monitoring, start diltiazem infusion in HR sustaining >115 after pain-control, check TSH and electrolytes. CHADS-VASc at least 5 (age x2, gender, HTN, DM) and anticoagulation will need to be considered as well.

## 2019-10-29 NOTE — NC FL2 (Addendum)
[place] LEVEL OF CARE SCREENING TOOL     IDENTIFICATION  Patient Name: Breanna Shields Birthdate: May 22, 1940 Sex: female Admission Date (Current Location): 10/24/2019  [facility] and [place] Number:  Herbalist and Address:  [facility] [place], [place] [address] [place], [place] [address]      Provider Number: [id number]  Attending Physician Name and Address:  Bonnielee Haff, MD  Relative Name and Phone Number:       Current Level of Care: SNF Recommended Level of Care: [place] Prior Approval Number:    Date Approved/Denied:   PASRR Number: [id number]  Discharge Plan: SNF    Current Diagnoses: Patient Active Problem List   Diagnosis Date Noted  . Closed fracture of thoracic spine without spinal cord lesion ([place]) 10/26/2019  . DM (diabetes mellitus) ([place])   . Depression   . T9 vertebral fracture ([place])   . Leukocytosis   . S/P revision of total knee, right 02/06/2019  . Mixed dyslipidemia 02/28/2016  . Hypertension, essential 02/28/2016  . Type 2 diabetes mellitus with hyperglycemia ([place]) 02/28/2016  . Dyspnea 04/17/2011    Orientation RESPIRATION BLADDER Height & Weight     Self  Normal Incontinent, Indwelling catheter Weight: 237 lb 7 oz (107.7 kg) Height:  5\' 3"  (160 cm)  BEHAVIORAL SYMPTOMS/MOOD NEUROLOGICAL BOWEL NUTRITION STATUS      Continent Diet (see DC summary)  AMBULATORY STATUS COMMUNICATION OF NEEDS Skin   Extensive Assist Verbally Surgical wounds (closed back incision surgery. Honeycomb dressing)                       Personal Care Assistance Level of Assistance  Bathing, Feeding, Dressing Bathing Assistance: Maximum assistance Feeding assistance: Independent Dressing Assistance: Maximum assistance     Functional Limitations Info  Hearing   Hearing Info: Impaired (right ear)      [place]  PT (By licensed PT), OT (By licensed OT)     PT Frequency:  5xweek OT Frequency: 5xweek            Contractures Contractures Info: Not present    Additional Factors Info  Code Status, Allergies, Psychotropic, Insulin Sliding Scale Code Status Info: Full Allergies Info: Chantix(Varencicline Tartrate), Oxytrol(Oxybutynin base), Varenicline Psychotropic Info: Lexapro 10mg  daily Insulin Sliding Scale Info: 0-15 units 3x/day with meals; 0-5 units daily at bed       Current Medications (10/29/2019):  This is the current hospital active medication list Current Facility-Administered Medications  Medication Dose Route Frequency Provider Last Rate Last Admin  . 0.9 %  sodium chloride infusion  250 mL Intravenous Continuous Ashok Pall, MD      . acetaminophen (TYLENOL) tablet 650 mg  650 mg Oral Q6H PRN Ashok Pall, MD   650 mg at 10/27/19 1448   Or  . acetaminophen (TYLENOL) suppository 650 mg  650 mg Rectal Q6H PRN Ashok Pall, MD      . B-complex with vitamin C tablet 1 tablet  1 tablet Oral Daily Ashok Pall, MD   1 tablet at 10/28/19 1012  . bisacodyl (DULCOLAX) EC tablet 5 mg  5 mg Oral Daily PRN Ashok Pall, MD      . cefTRIAXone (ROCEPHIN) 1 g in sodium chloride 0.9 % 100 mL IVPB  1 g Intravenous Q24H Bonnielee Haff, MD 200 mL/hr at 10/28/19 0959 1 g at 10/28/19 0959  . celecoxib (CELEBREX) capsule 200 mg  200 mg Oral Q12H Cabbell,  Marylyn Ishihara, MD   200 mg at 10/28/19 1008  . Chlorhexidine Gluconate Cloth 2 % PADS 6 each  6 each Topical Daily Bonnielee Haff, MD   6 each at 10/27/19 1009  . cholecalciferol (VITAMIN D3) tablet 2,000 Units  2,000 Units Oral Daily Ashok Pall, MD   2,000 Units at 10/28/19 1008  . diazepam (VALIUM) tablet 5 mg  5 mg Oral Q6H PRN Ashok Pall, MD      . escitalopram (LEXAPRO) tablet 10 mg  10 mg Oral Daily Ashok Pall, MD   10 mg at 10/28/19 1012  . haloperidol lactate (HALDOL) injection 1 mg  1 mg Intravenous Q6H PRN Bonnielee Haff, MD   1 mg at 10/29/19 0010  . heparin injection 5,000 Units  5,000 Units  Subcutaneous Q8H Ashok Pall, MD   5,000 Units at 10/29/19 0513  . hydrALAZINE (APRESOLINE) injection 10 mg  10 mg Intravenous Q6H PRN Ashok Pall, MD   10 mg at 10/27/19 0836  . HYDROcodone-acetaminophen (NORCO) 7.5-325 MG per tablet 1-2 tablet  1-2 tablet Oral Q4H PRN Ashok Pall, MD   2 tablet at 10/28/19 0343  . insulin aspart (novoLOG) injection 0-15 Units  0-15 Units Subcutaneous TID WC Ashok Pall, MD   5 Units at 10/29/19 0720  . insulin aspart (novoLOG) injection 0-5 Units  0-5 Units Subcutaneous QHS Ashok Pall, MD   3 Units at 10/26/19 2110  . irbesartan (AVAPRO) tablet 300 mg  300 mg Oral Daily Ashok Pall, MD   300 mg at 10/28/19 1011  . magnesium citrate solution 1 Bottle  1 Bottle Oral Once PRN Ashok Pall, MD      . meclizine (ANTIVERT) tablet 25 mg  25 mg Oral Q6H PRN Ashok Pall, MD   25 mg at 10/28/19 2231  . menthol-cetylpyridinium (CEPACOL) lozenge 3 mg  1 lozenge Oral PRN Ashok Pall, MD       Or  . phenol (CHLORASEPTIC) mouth spray 1 spray  1 spray Mouth/Throat PRN Ashok Pall, MD      . metoprolol tartrate (LOPRESSOR) injection 2.5 mg  2.5 mg Intravenous Q6H PRN Bonnielee Haff, MD   2.5 mg at 10/28/19 2240  . metoprolol tartrate (LOPRESSOR) tablet 25 mg  25 mg Oral BID Bonnielee Haff, MD   25 mg at 10/28/19 1022  . morphine 2 MG/ML injection 1 mg  1 mg Intravenous Q2H PRN Ashok Pall, MD   1 mg at 10/27/19 1601  . multivitamin with minerals tablet 1 tablet  1 tablet Oral Daily Ashok Pall, MD   1 tablet at 10/28/19 1006  . ondansetron (ZOFRAN) tablet 4 mg  4 mg Oral Q6H PRN Ashok Pall, MD   4 mg at 10/28/19 1418   Or  . ondansetron (ZOFRAN) injection 4 mg  4 mg Intravenous Q6H PRN Ashok Pall, MD   4 mg at 10/27/19 1000  . pioglitazone (ACTOS) tablet 30 mg  30 mg Oral Daily Ashok Pall, MD   30 mg at 10/28/19 1006  . senna (SENOKOT) tablet 8.6 mg  1 tablet Oral BID Ashok Pall, MD   8.6 mg at 10/28/19 2231  . senna-docusate (Senokot-S)  tablet 1 tablet  1 tablet Oral QHS PRN Ashok Pall, MD      . sodium chloride flush (NS) 0.9 % injection 3 mL  3 mL Intravenous Q12H Ashok Pall, MD   3 mL at 10/28/19 2229  . sodium chloride flush (NS) 0.9 % injection 3 mL  3 mL Intravenous PRN Ashok Pall,  MD         Discharge Medications: Please see discharge summary for a list of discharge medications.  Relevant Imaging Results:  Relevant Lab Results:   Additional Information SS#: [id number]  [place] Setting, Student-Social Work

## 2019-10-29 NOTE — TOC Initial Note (Signed)
Transition of Care [facility]) - Initial/Assessment Note    Patient Details  Name: Breanna Shields MRN: [id number] Date of Birth: 07-28-1940  Transition of Care [facility] CM/SW Contact:    Geralynn Ochs, LCSW Phone Number: 10/29/2019, 4:26 PM  Clinical Narrative:     CSW alerted by Rehab Admissions that patient may not be able to tolerate intensive CIR level therapies, may need SNF, with preference for either [facility] or [facility] CSW sent referral, and confirmed bed offer at [facility], no response yet from [facility]. CSW met with patient and spouse at bedside to ask about SNF preferences, and spouse indicated that patient's daughter would need to be contacted. CSW called daughter, Breanna Shields, over phone but she was at work; requested to call CSW back later. CSW to follow.            Expected Discharge Plan: Skilled Nursing Facility Barriers to Discharge: Insurance Authorization, Continued Medical Work up   Patient Goals and CMS Choice Patient states their goals for this hospitalization and ongoing recovery are:: patient unable to participate in goal setting due to disorientation CMS Medicare.gov Compare Post Acute Care list provided to:: Patient Represenative (must comment) Choice offered to / list presented to : Spouse  Expected Discharge Plan and Services Expected Discharge Plan: [place] Acute Care Choice: [place] Living arrangements for the past 2 months: Single Family Home                                      Prior Living Arrangements/Services Living arrangements for the past 2 months: Single Family Home Lives with:: Spouse Patient language and need for interpreter reviewed:: No Do you feel safe going back to the place where you live?: Yes      Need for Family Participation in Patient Care: Yes (Comment) Care giver support system in place?: No (comment)   Criminal Activity/Legal Involvement Pertinent to Current  Situation/Hospitalization: No - Comment as needed  Activities of Daily Living Home Assistive Devices/Equipment: None ADL Screening (condition at time of admission) Patient's cognitive ability adequate to safely complete daily activities?: Yes Is the patient deaf or have difficulty hearing?: No Does the patient have difficulty seeing, even when wearing glasses/contacts?: No Does the patient have difficulty concentrating, remembering, or making decisions?: No Patient able to express need for assistance with ADLs?: Yes Does the patient have difficulty dressing or bathing?: No Independently performs ADLs?: Yes (appropriate for developmental age) Does the patient have difficulty walking or climbing stairs?: No Weakness of Legs: Both Weakness of Arms/Hands: None  Permission Sought/Granted Permission sought to share information with : Facility Sport and exercise psychologist, Family Supports Permission granted to share information with : Yes, Verbal Permission Granted  Share Information with NAME: Sharon Mt  Permission granted to share info w AGENCY: SNF  Permission granted to share info w Relationship: Spouse, Daughter     Emotional Assessment Appearance:: Appears stated age Attitude/Demeanor/Rapport: Unable to Assess Affect (typically observed): Unable to Assess Orientation: : Oriented to Self, Oriented to Place Alcohol / Substance Use: Not Applicable Psych Involvement: No (comment)  Admission diagnosis:  T9 vertebral fracture ([place]) [S22.079A] Compression fracture of T9 vertebra, initial encounter ([place]) [S22.070A] Closed fracture of thoracic spine without spinal cord lesion, initial encounter [facility] [S22.009A] Patient Active Problem List   Diagnosis Date Noted  . Closed fracture of thoracic spine without spinal cord lesion (  [place]) 10/26/2019  . DM (diabetes mellitus) ([place])   . Depression   . T9 vertebral fracture ([place])   . Leukocytosis   . S/P revision of total knee, right 02/06/2019   . Mixed dyslipidemia 02/28/2016  . Hypertension, essential 02/28/2016  . Type 2 diabetes mellitus with hyperglycemia ([place]) 02/28/2016  . Dyspnea 04/17/2011   PCP:  Lorne Skeens, MD Pharmacy:   [id number] [place] [id number]: [phone]: [phone]    Social Determinants of Health (SDOH) Interventions    Readmission Risk Interventions No flowsheet data found.

## 2019-10-29 NOTE — Progress Notes (Signed)
Breanna Shields calmed down after haldol given and took her night medication afterwards.  Bladder scan done and noted 270  However pt did not void after pt pulled out her catheter. Pt assisted onto a BSC but only had small BM no urine out put.Urinary catheterr fr 14 reinserted  Using a sterilized procedure with Two nurses assist Pt tolerated procedure well 300 ml urine output noted. Py resting quietly in bed now Mews Score is a zero RN will continue to monitor pt.

## 2019-10-29 NOTE — Progress Notes (Signed)
[facility] PROGRESS NOTE   Breanna Shields DTO:[id number] DOB: [birth year] DOA: 10/24/2019  PCP: Lorne Skeens, MD  Brief History/Interval Summary: 79 y.o. female with medical history significant of hypertension, hyperlipidemia, type 2 diabetes mellitus, morbid obesity with BMI of 41 presented to emergency department for evaluation of back pain after fall.  Evaluation in the ED revealed T9 vertebral body fracture.  Neurosurgery was consulted.  Patient was hospitalized for further management.  Reason for Visit: T9 vertebral body fracture  Consultants: Neurosurgery  Procedures: 9/19: THORACIC SEVEN-THORACIC ELEVEN PERCUTANEOUS PEDICLE SCREW PLACEMENT, ORIF T9 fracture with segmental pedicle screw fixation T7-T11  Antibiotics: Anti-infectives (From admission, onward)   Start     Dose/Rate Route Frequency Ordered Stop   10/28/19 0900  cefTRIAXone (ROCEPHIN) 1 g in sodium chloride 0.9 % 100 mL IVPB        1 g 200 mL/hr over 30 Minutes Intravenous Every 24 hours 10/28/19 0823 10/31/19 0859   10/26/19 1600  cefTRIAXone (ROCEPHIN) 1 g in sodium chloride 0.9 % 100 mL IVPB        1 g 200 mL/hr over 30 Minutes Intravenous Every 24 hours 10/26/19 1008 10/28/19 1630   10/26/19 1113  sodium chloride 0.9 % with cefTRIAXone (ROCEPHIN) ADS Med       Note to Pharmacy: Bobbie Stack   : cabinet override      10/26/19 1113 10/26/19 2329      Subjective/Interval History: Became agitated yesterday evening.  She was noted to be confused.  She had to be given Haldol with good response.  Overnight events also noted.  Seems to be cooperative this morning though remains distracted.      Assessment/Plan:  T9 vertebral fracture This is secondary to a fall.  Neurosurgery was consulted.  Patient underwent surgery on 9/19.  Pain control.  Bowel regimen.  PT and OT evaluation.   Delirium Likely secondary to hospital stay, surgery, anesthesia as well as medications.  No focal neurological  deficits.  Following commands.  Continue to monitor.  TSH 1.47.  We will check B12 and folate levels.  Hypoxia She was noted to be hypoxic after her surgery.  Currently on nasal cannula.  Does not use oxygen at home.  No diagnosis of sleep apnea.  Chest x-ray showed atelectasis.  Continue with incentive spirometer.  Wean down oxygen as tolerated.  Remains stable from a respiratory standpoint.  She will need outpatient sleep study.  Essential hypertension Elevated blood pressure most likely due to pain as well as agitation.  Continued on ARB.  Started on metoprolol yesterday.  Seems to be better.  Continue to monitor.    Hyperlipidemia Continue statin.  History of depression and anxiety Continue Lexapro.  Normocytic anemia/leukocytosis No evidence of overt bleeding.  Hemoglobin is stable.  WBC is better.  Leukocytosis is most likely due to UTI.  Diabetes mellitus type 2 HbA1c 7.9.  Monitor CBGs.  SSI.  Holding Actos and Trulicity.  CBGs are reasonably well controlled.  UTI with E. coli and Klebsiella/Urinary Retention UA did show moderate leukocytes and positive nitrite.  Many bacteria 11-20 WBC.  Patient admitted to dysuria.  Urine culture revealed E. coli and Klebsiella.  Continue ceftriaxone for a 5-day treatment.   She required in and out catheterization yesterday morning.  She again retained urine overnight.  Foley catheter placed.  Severe obesity Estimated body mass index is 42.06 kg/m as calculated from the following:   Height as of this encounter: 5\' 3"  (1.6 m).  Weight as of this encounter: 107.7 kg.   DVT Prophylaxis: SQ heparin Code Status: Full code Family Communication: Discussed with the patient Disposition Plan: CIR recommended by PT and OT  Status is: Inpatient  Remains inpatient appropriate because:Ongoing active pain requiring inpatient pain management, IV treatments appropriate due to intensity of illness or inability to take PO and Inpatient level of care  appropriate due to severity of illness   Dispo: The patient is from: Home              Anticipated d/c is to: CIR              Anticipated d/c date is: 2 days              Patient currently is not medically stable to d/c.      Medications:  Scheduled: . B-complex with vitamin C  1 tablet Oral Daily  . celecoxib  200 mg Oral Q12H  . Chlorhexidine Gluconate Cloth  6 each Topical Daily  . cholecalciferol  2,000 Units Oral Daily  . escitalopram  10 mg Oral Daily  . heparin injection (subcutaneous)  5,000 Units Subcutaneous Q8H  . insulin aspart  0-15 Units Subcutaneous TID WC  . insulin aspart  0-5 Units Subcutaneous QHS  . irbesartan  300 mg Oral Daily  . metoprolol tartrate  25 mg Oral BID  . multivitamin with minerals  1 tablet Oral Daily  . pioglitazone  30 mg Oral Daily  . senna  1 tablet Oral BID  . sodium chloride flush  3 mL Intravenous Q12H   Continuous: . sodium chloride    . 0.9 % NaCl with KCl 20 mEq / L 50 mL/hr at 10/28/19 1107  . cefTRIAXone (ROCEPHIN)  IV 1 g (10/28/19 0959)   GQQ:PYPPJKDTOIZTI **OR** acetaminophen, bisacodyl, diazepam, haloperidol lactate, hydrALAZINE, HYDROcodone-acetaminophen, magnesium citrate, meclizine, menthol-cetylpyridinium **OR** phenol, metoprolol tartrate, morphine injection, ondansetron **OR** ondansetron (ZOFRAN) IV, senna-docusate, sodium chloride flush   Objective:  Vital Signs  Vitals:   10/29/19 0050 10/29/19 0307 10/29/19 0508 10/29/19 0804  BP: 139/76 (!) 117/59 140/64 (!) 155/69  Pulse: (!) 113 (!) 108 79 (!) 101  Resp: 19 15 20 20   Temp: 98.1 F (36.7 C) 98.6 F (37 C) 98.2 F (36.8 C) 99.4 F (37.4 C)  TempSrc: Oral  Oral Oral  SpO2: 93% (!) 87% 96% 91%  Weight:      Height:        Intake/Output Summary (Last 24 hours) at 10/29/2019 1017 Last data filed at 10/29/2019 0900 Gross per 24 hour  Intake 730 ml  Output 1660 ml  Net -930 ml   Filed Weights   10/24/19 2341 10/24/19 2349 10/26/19 0316  Weight:  107.5 kg 107.5 kg 107.7 kg    General appearance: Awake alert.  Distracted. Resp: Clear to auscultation bilaterally.  Normal effort Cardio: S1-S2 is normal regular.  No S3-S4.  No rubs murmurs or bruit GI: Abdomen is soft.  Nontender nondistended.  Bowel sounds are present normal.  No masses organomegaly Extremities: No edema.  Moving all her extremities Neurologic: Oriented to city state year.  No facial asymmetry.  Motor strength equal bilateral upper and lower extremities.     Lab Results:  Data Reviewed: I have personally reviewed following labs and imaging studies  CBC: Recent Labs  Lab 10/25/19 0058 10/26/19 0705 10/27/19 1046 10/28/19 1147 10/29/19 0500  WBC 12.3* 10.7* 19.7* 17.4* 13.9*  HGB 11.1* 11.3* 10.4* 10.1* 9.9*  HCT 36.7 36.5  33.9* 32.6* 31.8*  MCV 99.5 97.9 96.0 97.0 96.1  PLT 251 230 234 224 025    Basic Metabolic Panel: Recent Labs  Lab 10/25/19 0058 10/25/19 1400 10/26/19 0705 10/27/19 1046 10/28/19 1147 10/29/19 0500  NA 139  --  139 139 141 140  K 4.2  --  3.9 3.8 4.0 4.0  CL 104  --  104 106 108 106  CO2 26  --  27 24 24 23   GLUCOSE 171*  --  167* 164* 164* 231*  BUN 23  --  13 14 15 16   CREATININE 1.00  --  0.74 0.70 0.76 0.79  CALCIUM 8.7*  --  8.9 8.8* 8.6* 9.2  MG  --  1.8  --   --   --   --   PHOS  --  2.4*  --   --   --   --     GFR: Estimated Creatinine Clearance: 68.2 mL/min (by C-G formula based on SCr of 0.79 mg/dL).  Liver Function Tests: Recent Labs  Lab 10/26/19 0705 10/27/19 1046 10/28/19 1147 10/29/19 0500  AST 17 25 27 29   ALT 20 24 24 28   ALKPHOS 68 62 65 69  BILITOT 0.6 0.7 0.9 1.1  PROT 6.3* 6.1* 5.9* 6.4*  ALBUMIN 3.0* 2.9* 2.7* 2.7*    CBG: Recent Labs  Lab 10/28/19 0600 10/28/19 1155 10/28/19 1608 10/28/19 2117 10/29/19 0619  GLUCAP 165* 176* 194* 171* 204*     Recent Results (from the past 240 hour(s))  SARS Coronavirus 2 by RT PCR (hospital order, performed in [place] hospital lab)  Nasopharyngeal Nasopharyngeal Swab     Status: None   Collection Time: 10/25/19  7:38 AM   Specimen: Nasopharyngeal Swab  Result Value Ref Range Status   SARS Coronavirus 2 NEGATIVE NEGATIVE Final    Comment: (NOTE) SARS-CoV-2 target nucleic acids are NOT DETECTED.  The SARS-CoV-2 RNA is generally detectable in upper and lower respiratory specimens during the acute phase of infection. The lowest concentration of SARS-CoV-2 viral copies this assay can detect is 250 copies / mL. A negative result does not preclude SARS-CoV-2 infection and should not be used as the sole basis for treatment or other patient management decisions.  A negative result may occur with improper specimen collection / handling, submission of specimen other than nasopharyngeal swab, presence of viral mutation(s) within the areas targeted by this assay, and inadequate number of viral copies (<250 copies / mL). A negative result must be combined with clinical observations, patient history, and epidemiological information.  Fact Sheet for Patients:   StrictlyIdeas.no  Fact Sheet for Healthcare Providers: BankingDealers.co.za  This test is not yet approved or  cleared by the [place] FDA and has been authorized for detection and/or diagnosis of SARS-CoV-2 by FDA under an Emergency Use Authorization (EUA).  This EUA will remain in effect (meaning this test can be used) for the duration of the COVID-19 declaration under Section 564(b)(1) of the Act, 21 U.S.C. section 360bbb-3(b)(1), unless the authorization is terminated or revoked sooner.  Performed at [facility], [place] [address] [place], [place] [address]   Surgical pcr screen     Status: None   Collection Time: 10/25/19 10:01 PM   Specimen: Nasal Mucosa; Nasal Swab  Result Value Ref Range Status   MRSA, PCR NEGATIVE NEGATIVE Final   Staphylococcus aureus NEGATIVE NEGATIVE Final    Comment:  (NOTE) The Xpert SA Assay (FDA approved for NASAL specimens in patients  70 years of age and older), is one component of a comprehensive surveillance program. It is not intended to diagnose infection nor to guide or monitor treatment. Performed at [facility], [place] [address] [place], [place] [address]   Culture, Urine     Status: Abnormal   Collection Time: 10/26/19 10:09 AM   Specimen: Urine, Random  Result Value Ref Range Status   Specimen Description URINE, RANDOM  Final   Special Requests   Final    A URINE Performed at [facility], [place] [address], [place], [place] [address]    Culture (A)  Final    >=100,000 COLONIES/mL KLEBSIELLA PNEUMONIAE >=100,000 COLONIES/mL ESCHERICHIA COLI    Report Status 10/28/2019 FINAL  Final   Organism ID, Bacteria KLEBSIELLA PNEUMONIAE (A)  Final   Organism ID, Bacteria ESCHERICHIA COLI (A)  Final      Susceptibility   Escherichia coli - MIC*    AMPICILLIN <=2 SENSITIVE Sensitive     CEFAZOLIN <=4 SENSITIVE Sensitive     CEFTRIAXONE <=0.25 SENSITIVE Sensitive     CIPROFLOXACIN <=0.25 SENSITIVE Sensitive     GENTAMICIN <=1 SENSITIVE Sensitive     IMIPENEM <=0.25 SENSITIVE Sensitive     NITROFURANTOIN <=16 SENSITIVE Sensitive     TRIMETH/SULFA <=20 SENSITIVE Sensitive     AMPICILLIN/SULBACTAM <=2 SENSITIVE Sensitive     PIP/TAZO <=4 SENSITIVE Sensitive     * >=100,000 COLONIES/mL ESCHERICHIA COLI   Klebsiella pneumoniae - MIC*    AMPICILLIN >=32 RESISTANT Resistant     CEFAZOLIN <=4 SENSITIVE Sensitive     CEFTRIAXONE <=0.25 SENSITIVE Sensitive     CIPROFLOXACIN <=0.25 SENSITIVE Sensitive     GENTAMICIN <=1 SENSITIVE Sensitive     IMIPENEM <=0.25 SENSITIVE Sensitive     NITROFURANTOIN <=16 SENSITIVE Sensitive     TRIMETH/SULFA <=20 SENSITIVE Sensitive     AMPICILLIN/SULBACTAM 8 SENSITIVE Sensitive     PIP/TAZO <=4 SENSITIVE Sensitive     * >=100,000 COLONIES/mL KLEBSIELLA PNEUMONIAE      Radiology Studies: No results  found.     LOS: 4 days   Diane Hanel [employer] on [email or web address]  10/29/2019, 10:17 AM

## 2019-10-29 NOTE — Progress Notes (Signed)
This RN went in to give pt  Night time medication and found out pt pulled out  Foley catheter which was placed this evening at 1845.  Foley was in situ  during shift assessment . Pt very confused  stating "get me out of here".  When asked by this RN where she was, pt stated  Iam  at the beauty shop. Pt redirected and reoriented to  Place  time and situation.   Pt medication given, she got the medication cup from RN but refused to take the medication, BP was high when recheck  166/122   Pt agitated and pulled off the BP cuff.                                              Soft Mitten applied to both hands and IV/ SQ medication given . Will  recheck BP and insert foley if pt is calm and allow Nsg. staff to do so.  Will continue to monitor pt & notify MD if pt continue to refuse care.

## 2019-10-30 ENCOUNTER — Inpatient Hospital Stay (HOSPITAL_COMMUNITY): Payer: HMO

## 2019-10-30 DIAGNOSIS — I361 Nonrheumatic tricuspid (valve) insufficiency: Secondary | ICD-10-CM

## 2019-10-30 DIAGNOSIS — I4891 Unspecified atrial fibrillation: Secondary | ICD-10-CM

## 2019-10-30 LAB — GLUCOSE, CAPILLARY
Glucose-Capillary: 169 mg/dL — ABNORMAL HIGH (ref 70–99)
Glucose-Capillary: 134 mg/dL — ABNORMAL HIGH (ref 70–99)
Glucose-Capillary: 194 mg/dL — ABNORMAL HIGH (ref 70–99)
Glucose-Capillary: 82 mg/dL (ref 70–99)

## 2019-10-30 LAB — COMPREHENSIVE METABOLIC PANEL
ALT: 32 U/L (ref 0–44)
AST: 28 U/L (ref 15–41)
Albumin: 2.5 g/dL — ABNORMAL LOW (ref 3.5–5.0)
Alkaline Phosphatase: 68 U/L (ref 38–126)
Anion gap: 11 (ref 5–15)
BUN: 18 mg/dL (ref 8–23)
CO2: 22 mmol/L (ref 22–32)
Calcium: 9.1 mg/dL (ref 8.9–10.3)
Chloride: 106 mmol/L (ref 98–111)
Creatinine, Ser: 0.84 mg/dL (ref 0.44–1.00)
GFR calc Af Amer: >60 mL/min (ref >60)
GFR calc non Af Amer: >60 mL/min (ref >60)
Glucose, Bld: 169 mg/dL — ABNORMAL HIGH (ref 70–99)
Potassium: 4.1 mmol/L (ref 3.5–5.1)
Sodium: 139 mmol/L (ref 135–145)
Total Bilirubin: 0.9 mg/dL (ref 0.3–1.2)
Total Protein: 6 g/dL — ABNORMAL LOW (ref 6.5–8.1)

## 2019-10-30 LAB — CBC
HCT: 32.2 % — ABNORMAL LOW (ref 36.0–46.0)
Hemoglobin: 10.1 g/dL — ABNORMAL LOW (ref 12.0–15.0)
MCH: 30.3 pg (ref 26.0–34.0)
MCHC: 31.4 g/dL (ref 30.0–36.0)
MCV: 96.7 fL (ref 80.0–100.0)
Platelets: 261 10*3/uL (ref 150–400)
RBC: 3.33 MIL/uL — ABNORMAL LOW (ref 3.87–5.11)
RDW: 13.6 % (ref 11.5–15.5)
WBC: 11 10*3/uL — ABNORMAL HIGH (ref 4.0–10.5)
nRBC: 0 % (ref 0.0–0.2)

## 2019-10-30 LAB — ECHOCARDIOGRAM COMPLETE
Area-P 1/2: 3.42 cm2
Height: 63 in
S' Lateral: 2.98 cm
Weight: 4067.05 oz

## 2019-10-30 LAB — RETICULOCYTES
Immature Retic Fract: 22.7 % — ABNORMAL HIGH (ref 2.3–15.9)
RBC.: 3.34 MIL/uL — ABNORMAL LOW (ref 3.87–5.11)
Retic Count, Absolute: 56.8 10*3/uL (ref 19.0–186.0)
Retic Ct Pct: 1.7 % (ref 0.4–3.1)

## 2019-10-30 LAB — IRON AND TIBC
Iron: 30 ug/dL (ref 28–170)
Saturation Ratios: 12 % (ref 10.4–31.8)
TIBC: 241 ug/dL — ABNORMAL LOW (ref 250–450)
UIBC: 211 ug/dL

## 2019-10-30 LAB — HEPARIN LEVEL (UNFRACTIONATED): Heparin Unfractionated: 0.44 IU/mL (ref 0.30–0.70)

## 2019-10-30 LAB — VITAMIN B12: Vitamin B-12: 1459 pg/mL — ABNORMAL HIGH (ref 180–914)

## 2019-10-30 LAB — MAGNESIUM: Magnesium: 2 mg/dL (ref 1.7–2.4)

## 2019-10-30 LAB — FOLATE: Folate: 26 ng/mL (ref >5.9)

## 2019-10-30 LAB — FERRITIN: Ferritin: 216 ng/mL (ref 11–307)

## 2019-10-30 LAB — TSH: TSH: 1.778 u[IU]/mL (ref 0.350–4.500)

## 2019-10-30 MED ORDER — DILTIAZEM HCL-DEXTROSE 125-5 MG/125ML-% IV SOLN (PREMIX)
5.0000 mg/h | INTRAVENOUS | Status: DC
  Administered 2019-10-30: 5 mg/h via INTRAVENOUS
  Administered 2019-10-30: 15 mg/h via INTRAVENOUS
  Filled 2019-10-30 (×2): qty 125

## 2019-10-30 MED ORDER — SODIUM CHLORIDE 0.9 % IV SOLN
1.0000 g | INTRAVENOUS | Status: AC
  Administered 2019-10-31 – 2019-11-01 (×2): 1 g via INTRAVENOUS
  Filled 2019-10-30 (×2): qty 10

## 2019-10-30 MED ORDER — IRBESARTAN 150 MG PO TABS
150.0000 mg | ORAL_TABLET | Freq: Every day | ORAL | Status: DC
  Administered 2019-10-31 – 2019-11-04 (×5): 150 mg via ORAL
  Filled 2019-10-30 (×5): qty 1

## 2019-10-30 MED ORDER — METOPROLOL TARTRATE 50 MG PO TABS
50.0000 mg | ORAL_TABLET | Freq: Two times a day (BID) | ORAL | Status: DC
  Administered 2019-10-30 – 2019-11-04 (×11): 50 mg via ORAL
  Filled 2019-10-30 (×10): qty 1

## 2019-10-30 MED ORDER — HEPARIN (PORCINE) 25000 UT/250ML-% IV SOLN
1200.0000 [IU]/h | INTRAVENOUS | Status: DC
  Administered 2019-10-30: 1200 [IU]/h via INTRAVENOUS
  Filled 2019-10-30: qty 250

## 2019-10-30 MED ORDER — MORPHINE SULFATE (PF) 4 MG/ML IV SOLN
3.0000 mg | Freq: Once | INTRAVENOUS | Status: AC
  Administered 2019-10-30: 3 mg via INTRAVENOUS
  Filled 2019-10-30: qty 1

## 2019-10-30 NOTE — Progress Notes (Signed)
Patient ID: Breanna Shields, female   DOB: 02/25/1940, 79 y.o.   MRN: [id number] .BP (!) 153/56 (BP Location: Right Arm)   Pulse 65   Temp 97.8 F (36.6 C) (Oral)   Resp 16   Ht 5\' 3"  (1.6 m)   Wt 115.3 kg   SpO2 97%   BMI 45.03 kg/m  Events of night noted. Okay for heparin, procedure was performed percutaneously. Eliquis also indicated and can be given.  Sedated, confused. This is near baseline for this hospitalization.  Wound is clean, dry, no signs of infection.

## 2019-10-30 NOTE — Progress Notes (Deleted)
CRITICAL VALUE ALERT  Critical Value:  hgb-5.8  Date & Time Notied:  10/30/2019 3:18 AM  Provider Notified: Dr. Myna Hidalgo  Orders Received/Actions taken: 2 units PRBCs ordered.

## 2019-10-30 NOTE — Progress Notes (Addendum)
[facility] PROGRESS NOTE   Breanna Shields RCV:[id number] DOB: [birth year] DOA: 10/24/2019  PCP: Lorne Skeens, MD  Brief History/Interval Summary: 79 y.o. female with medical history significant of hypertension, hyperlipidemia, type 2 diabetes mellitus, morbid obesity with BMI of 41 presented to emergency department for evaluation of back pain after fall.  Evaluation in the ED revealed T9 vertebral body fracture.  Neurosurgery was consulted.  Patient was hospitalized for further management.  Reason for Visit: T9 vertebral body fracture  Consultants: Neurosurgery  Procedures: 9/19: THORACIC SEVEN-THORACIC ELEVEN PERCUTANEOUS PEDICLE SCREW PLACEMENT, ORIF T9 fracture with segmental pedicle screw fixation T7-T11  Antibiotics: Anti-infectives (From admission, onward)   Start     Dose/Rate Route Frequency Ordered Stop   10/28/19 0900  cefTRIAXone (ROCEPHIN) 1 g in sodium chloride 0.9 % 100 mL IVPB        1 g 200 mL/hr over 30 Minutes Intravenous Every 24 hours 10/28/19 0823 10/31/19 0859   10/26/19 1600  cefTRIAXone (ROCEPHIN) 1 g in sodium chloride 0.9 % 100 mL IVPB        1 g 200 mL/hr over 30 Minutes Intravenous Every 24 hours 10/26/19 1008 10/28/19 1630   10/26/19 1113  sodium chloride 0.9 % with cefTRIAXone (ROCEPHIN) ADS Med       Note to Pharmacy: Bobbie Stack   : cabinet override      10/26/19 1113 10/26/19 2329      Subjective/Interval History: Overnight events noted.  Patient went to atrial fibrillation with RVR.  Seems to be in sinus rhythm this morning.  She remains quite fatigued.  Denies any chest pain.     Assessment/Plan:  Atrial fibrillation with RVR Patient noted to be tachycardic overnight.  EKG showed A. fib.  She was given bolus dose of Cardizem without improvement.  Started on Cardizem infusion.  She was already on metoprolol orally.  Noted to be in sinus rhythm this morning.  We will increase the dose of her metoprolol and discontinue the  diltiazem infusion.   TSH noted to be normal this morning at 1.77.  Echocardiogram will be ordered.  Chads 2 vascular score is 5.  Discussed anticoagulation with neurosurgery.  Okay to initiate as surgery was percutaneous.  Will do IV heparin without bolus for now.  If she does okay without any bleeding can be transitioned to [employer].  Discussed with daughter. Risks and benefits of anti-coagulation explained to patient and daughter. Agreeable to proceed.   T9 vertebral fracture This is secondary to a fall.  Neurosurgery was consulted.  Patient underwent surgery on 9/19.  Pain control.  Bowel regimen.  PT and OT evaluation.  Patient will need placement to skilled nursing facility for rehab.  Delirium Likely secondary to hospital stay, surgery, anesthesia as well as medications.  Seems to be stable from a neurological standpoint.  Metabolic work-up unremarkable with B12 level of 1000 459, TSH of 1.778, folate of 26.0.  Reorient daily.  No focal neurological deficits noted.  Hypoxia She was noted to be hypoxic after her surgery.  Currently on nasal cannula.  Does not use oxygen at home.  Chest x-ray showed atelectasis.  Continue with incentive spirometer.   Desaturation noted mainly when she is sleeping suggesting patient may have undiagnosed sleep apnea. She will need outpatient sleep study. Status remains stable.  Essential hypertension Elevated blood pressure most likely due to pain as well as agitation.  She was continued on ARB.  Dose of metoprolol will be increased due to her  atrial fibrillation.  Will cut back on her ARB.  Hyperlipidemia Continue statin.  History of depression and anxiety Continue Lexapro.  Normocytic anemia/leukocytosis No evidence of overt bleeding.  Hemoglobin remained stable.  Anemia panel reviewed.  No deficiencies identified.  No overt bleeding.  Leukocytosis is better.    Diabetes mellitus type 2 HbA1c 7.9.  Monitor CBGs.  SSI.  Holding Actos and  Trulicity.  CBGs are reasonably well controlled.  UTI with E. coli and Klebsiella/Urinary Retention UA did show moderate leukocytes and positive nitrite.  Many bacteria 11-20 WBC.  Patient admitted to dysuria.  Urine culture revealed E. coli and Klebsiella.   Continue ceftriaxone. Initially planned 5 day course, but due to continued symptoms will extend to 7 days. Patient experienced urinary retention.  Has required Foley catheter placement.  Voiding trial when she is more mobile.  Severe obesity Estimated body mass index is 45.03 kg/m as calculated from the following:   Height as of this encounter: 5\' 3"  (1.6 m).   Weight as of this encounter: 115.3 kg.   DVT Prophylaxis: SQ heparin Code Status: Full code Family Communication: Daughter was updated yesterday.  Will do so again today Disposition Plan: SNF when stable.  Status is: Inpatient  Remains inpatient appropriate because:Ongoing active pain requiring inpatient pain management, IV treatments appropriate due to intensity of illness or inability to take PO and Inpatient level of care appropriate due to severity of illness   Dispo: The patient is from: Home              Anticipated d/c is to: SNF              Anticipated d/c date is: 2 days              Patient currently is not medically stable to d/c.      Medications:  Scheduled: . B-complex with vitamin C  1 tablet Oral Daily  . celecoxib  200 mg Oral Q12H  . Chlorhexidine Gluconate Cloth  6 each Topical Daily  . cholecalciferol  2,000 Units Oral Daily  . escitalopram  10 mg Oral Daily  . heparin injection (subcutaneous)  5,000 Units Subcutaneous Q8H  . insulin aspart  0-15 Units Subcutaneous TID WC  . insulin aspart  0-5 Units Subcutaneous QHS  . irbesartan  300 mg Oral Daily  . metoprolol tartrate  50 mg Oral BID  . multivitamin with minerals  1 tablet Oral Daily  . pioglitazone  30 mg Oral Daily  . senna  1 tablet Oral BID  . sodium chloride flush  3 mL  Intravenous Q12H   Continuous: . sodium chloride    . cefTRIAXone (ROCEPHIN)  IV 1 g (10/30/19 1134)  . diltiazem (CARDIZEM) infusion 15 mg/hr (10/30/19 1131)   CVE:LFYBOFBPZWCHE **OR** acetaminophen, bisacodyl, diazepam, haloperidol lactate, HYDROcodone-acetaminophen, magnesium citrate, meclizine, menthol-cetylpyridinium **OR** phenol, metoprolol tartrate, morphine injection, ondansetron **OR** ondansetron (ZOFRAN) IV, senna-docusate, sodium chloride flush   Objective:  Vital Signs  Vitals:   10/30/19 0345 10/30/19 0445 10/30/19 0450 10/30/19 0754  BP: (!) 146/106   (!) 125/41  Pulse: (!) 135 (!) 133  70  Resp:    16  Temp:    98 F (36.7 C)  TempSrc:    Oral  SpO2:    94%  Weight:   115.3 kg   Height:        Intake/Output Summary (Last 24 hours) at 10/30/2019 1143 Last data filed at 10/29/2019 2200 Gross per 24 hour  Intake 460 ml  Output 700 ml  Net -240 ml   Filed Weights   10/24/19 2349 10/26/19 0316 10/30/19 0450  Weight: 107.5 kg 107.7 kg 115.3 kg    General appearance: Awake.  Distracted.  In no distress. Resp: Normal effort at rest.  Diminished air entry at the bases.  No wheezing or rhonchi.  Few crackles. Cardio: S1-S2 is normal regular.  No S3-S4.  No rubs murmurs or bruit.  Telemetry shows sinus rhythm.  She converted to sinus from A. fib around 630 this morning. GI: Abdomen is soft.  Nontender nondistended.  Bowel sounds are present normal.  No masses organomegaly Extremities: No edema.  Moving all her extremities. Neurologic: No facial asymmetry.  No obvious focal neurological deficits.     Lab Results:  Data Reviewed: I have personally reviewed following labs and imaging studies  CBC: Recent Labs  Lab 10/26/19 0705 10/27/19 1046 10/28/19 1147 10/29/19 0500 10/30/19 0416  WBC 10.7* 19.7* 17.4* 13.9* 11.0*  HGB 11.3* 10.4* 10.1* 9.9* 10.1*  HCT 36.5 33.9* 32.6* 31.8* 32.2*  MCV 97.9 96.0 97.0 96.1 96.7  PLT 230 234 224 236 261    Basic  Metabolic Panel: Recent Labs  Lab 10/25/19 0058 10/25/19 1400 10/26/19 0705 10/27/19 1046 10/28/19 1147 10/29/19 0500 10/30/19 0416  NA   < >  --  139 139 141 140 139  K   < >  --  3.9 3.8 4.0 4.0 4.1  CL   < >  --  104 106 108 106 106  CO2   < >  --  27 24 24 23 22   GLUCOSE   < >  --  167* 164* 164* 231* 169*  BUN   < >  --  13 14 15 16 18   CREATININE   < >  --  0.74 0.70 0.76 0.79 0.84  CALCIUM   < >  --  8.9 8.8* 8.6* 9.2 9.1  MG  --  1.8  --   --   --   --  2.0  PHOS  --  2.4*  --   --   --   --   --    < > = values in this interval not displayed.    GFR: Estimated Creatinine Clearance: 67.6 mL/min (by C-G formula based on SCr of 0.84 mg/dL).  Liver Function Tests: Recent Labs  Lab 10/26/19 0705 10/27/19 1046 10/28/19 1147 10/29/19 0500 10/30/19 0416  AST 17 25 27 29 28   ALT 20 24 24 28  32  ALKPHOS 68 62 65 69 68  BILITOT 0.6 0.7 0.9 1.1 0.9  PROT 6.3* 6.1* 5.9* 6.4* 6.0*  ALBUMIN 3.0* 2.9* 2.7* 2.7* 2.5*    CBG: Recent Labs  Lab 10/29/19 0619 10/29/19 1146 10/29/19 1641 10/29/19 2108 10/30/19 0626  GLUCAP 204* 180* 193* 104* 169*     Recent Results (from the past 240 hour(s))  SARS Coronavirus 2 by RT PCR (hospital order, performed in [place] hospital lab) Nasopharyngeal Nasopharyngeal Swab     Status: None   Collection Time: 10/25/19  7:38 AM   Specimen: Nasopharyngeal Swab  Result Value Ref Range Status   SARS Coronavirus 2 NEGATIVE NEGATIVE Final    Comment: (NOTE) SARS-CoV-2 target nucleic acids are NOT DETECTED.  The SARS-CoV-2 RNA is generally detectable in upper and lower respiratory specimens during the acute phase of infection. The lowest concentration of SARS-CoV-2 viral copies this assay can detect is 250 copies / mL. A negative  result does not preclude SARS-CoV-2 infection and should not be used as the sole basis for treatment or other patient management decisions.  A negative result may occur with improper specimen collection  / handling, submission of specimen other than nasopharyngeal swab, presence of viral mutation(s) within the areas targeted by this assay, and inadequate number of viral copies (<250 copies / mL). A negative result must be combined with clinical observations, patient history, and epidemiological information.  Fact Sheet for Patients:   StrictlyIdeas.no  Fact Sheet for Healthcare Providers: BankingDealers.co.za  This test is not yet approved or  cleared by the [place] FDA and has been authorized for detection and/or diagnosis of SARS-CoV-2 by FDA under an Emergency Use Authorization (EUA).  This EUA will remain in effect (meaning this test can be used) for the duration of the COVID-19 declaration under Section 564(b)(1) of the Act, 21 U.S.C. section 360bbb-3(b)(1), unless the authorization is terminated or revoked sooner.  Performed at [facility], [place] [address], [place], [place] [address]   Surgical pcr screen     Status: None   Collection Time: 10/25/19 10:01 PM   Specimen: Nasal Mucosa; Nasal Swab  Result Value Ref Range Status   MRSA, PCR NEGATIVE NEGATIVE Final   Staphylococcus aureus NEGATIVE NEGATIVE Final    Comment: (NOTE) The Xpert SA Assay (FDA approved for NASAL specimens in patients 70 years of age and older), is one component of a comprehensive surveillance program. It is not intended to diagnose infection nor to guide or monitor treatment. Performed at [facility], [place] [address] [place], [place] [address]   Culture, Urine     Status: Abnormal   Collection Time: 10/26/19 10:09 AM   Specimen: Urine, Random  Result Value Ref Range Status   Specimen Description URINE, RANDOM  Final   Special Requests   Final    A URINE Performed at [facility], [place] [address] [place], [place] [address]    Culture (A)  Final    >=100,000 COLONIES/mL KLEBSIELLA PNEUMONIAE >=100,000 COLONIES/mL  ESCHERICHIA COLI    Report Status 10/28/2019 FINAL  Final   Organism ID, Bacteria KLEBSIELLA PNEUMONIAE (A)  Final   Organism ID, Bacteria ESCHERICHIA COLI (A)  Final      Susceptibility   Escherichia coli - MIC*    AMPICILLIN <=2 SENSITIVE Sensitive     CEFAZOLIN <=4 SENSITIVE Sensitive     CEFTRIAXONE <=0.25 SENSITIVE Sensitive     CIPROFLOXACIN <=0.25 SENSITIVE Sensitive     GENTAMICIN <=1 SENSITIVE Sensitive     IMIPENEM <=0.25 SENSITIVE Sensitive     NITROFURANTOIN <=16 SENSITIVE Sensitive     TRIMETH/SULFA <=20 SENSITIVE Sensitive     AMPICILLIN/SULBACTAM <=2 SENSITIVE Sensitive     PIP/TAZO <=4 SENSITIVE Sensitive     * >=100,000 COLONIES/mL ESCHERICHIA COLI   Klebsiella pneumoniae - MIC*    AMPICILLIN >=32 RESISTANT Resistant     CEFAZOLIN <=4 SENSITIVE Sensitive     CEFTRIAXONE <=0.25 SENSITIVE Sensitive     CIPROFLOXACIN <=0.25 SENSITIVE Sensitive     GENTAMICIN <=1 SENSITIVE Sensitive     IMIPENEM <=0.25 SENSITIVE Sensitive     NITROFURANTOIN <=16 SENSITIVE Sensitive     TRIMETH/SULFA <=20 SENSITIVE Sensitive     AMPICILLIN/SULBACTAM 8 SENSITIVE Sensitive     PIP/TAZO <=4 SENSITIVE Sensitive     * >=100,000 COLONIES/mL KLEBSIELLA PNEUMONIAE      Radiology Studies: No results found.     LOS: 5 days  Nikolai Wilczak [employer]  [facility] operational officer on [email or web address]  10/30/2019, 11:43 AM

## 2019-10-30 NOTE — Progress Notes (Signed)
ANTICOAGULATION CONSULT NOTE - follow up Pharmacy Consult for Heparin Indication: Atrial fibrillation  Allergies  Allergen Reactions  . Chantix [Varenicline Tartrate] Nausea Only and Other (See Comments)    Dizziness  . Oxytrol [Oxybutynin Base] Other (See Comments)    Dry mouth   . Varenicline Nausea Only    Patient Measurements: Height: 5\' 3"  (160 cm) Weight: 115.3 kg (254 lb 3.1 oz) IBW/kg (Calculated) : 52.4 Heparin dosing weight: 80 kg  Vital Signs: Temp: 98.6 F (37 C) (09/23 2008) Temp Source: Axillary (09/23 2008) BP: 148/93 (09/23 2008) Pulse Rate: 76 (09/23 2008)  Labs: Recent Labs    10/28/19 1147 10/28/19 1147 10/29/19 0500 10/30/19 0416 10/30/19 2104  HGB 10.1*   < > 9.9* 10.1*  --   HCT 32.6*  --  31.8* 32.2*  --   PLT 224  --  236 261  --   HEPARINUNFRC  --   --   --   --  0.44  CREATININE 0.76  --  0.79 0.84  --    < > = values in this interval not displayed.    Estimated Creatinine Clearance: 67.6 mL/min (by C-G formula based on SCr of 0.84 mg/dL).   Medical History: Past Medical History:  Diagnosis Date  . Allergy   . Diabetes mellitus    type ii  . Hyperlipidemia   . Hypertension     Assessment: 79 yo female with new onset afib with RVR to be placed on a heparin drip. Patient with recent neurosurgical procedure on 9/19, therefore will not bolus patient. No anticoagulation at home PTA.   Heparin level is 0.44 on heparin 1200 units/hr., therapeutic level.   No bleeding reported.      Goal of Therapy:  Heparin level 0.3-0.7 units/ml Monitor platelets by anticoagulation protocol: Yes   Plan: Continue IVHeparin drip at 1200 units/hr No bolus Recheck heparin level in ~8 hours at 0500 Heparin level and CBC daily  Follow up for oral anticoagulation plan. Neurosurgeon noted that Eliquis also indicated and can be given.   Nicole Cella, RPh Clinical Pharmacist [place] Please utilize Amion for appropriate phone number to reach the  unit pharmacist ([place]) 10/30/2019,9:49 PM

## 2019-10-30 NOTE — Progress Notes (Signed)
Patient's HR went up to 160 at 2225 on 10/29/19, given prn lopressor 2.'5mg'$  iv, was not effective, denied of chest pain, no s/s resp. Distress,  stat EKG showed A-Fib with RV, paged oncall provider MD T. Opyd and  Called RRT Received  Order of Cardizem '15mg'$  in x1, after cardizem administered HR came down to 140-120, MD T. Opyd came to see pt .   Pt c/o nausea and severe pain on surgical site, given Morphine '3mg'$  and zofran '4mg'$  iv. Also ordered cardizem drip due to sustain HR above 120.  Pt is on Cardizem drip at this moment , HR is in 115-130 at this time, stable,  will continue to monitor closely.  Pt's daughter Morey Hummingbird is notified of the condition.

## 2019-10-30 NOTE — Progress Notes (Signed)
  Echocardiogram 2D Echocardiogram has been performed.  Breanna Shields 10/30/2019, 3:40 PM

## 2019-10-30 NOTE — Progress Notes (Addendum)
Physical Therapy Treatment/Vestibular Assessment Patient Details Name: Breanna Shields MRN: [id number] DOB: 01-22-1941 Today's Date: 10/30/2019    History of Present Illness 79 y.o. female admitted on 10/24/19 after falling with T9 vertebral body fx.  Pt underwent T7-11 pedical screw placement and ORIF of T9 on 10/26/19.  Other medical issues being worked on this admission are A-fib with RVR, delirium, hypoxia requiring supplemental O2 (was not on O2 at baseline), and UTI.  Pt with significant PMH of HTN, DM, bil TKA, obesity.      PT Comments    Pt presents (+) for R posterior canal BPPV (and possibly left based on her response returning to supine on her left side) and treated x1 with Epley's (not more than once due to limited by pain).  She would benefit from continued evaluation and treatment of her vestibular symptoms which may have contributed to her fall (or be a result of her fall, pt unable to report).  She was able to stand EOB and side step up to the [place] with mod assist and support of the walker, she refused OOB to chair and continues to need supplemental O2 due to O2 sats 84% on RA.  PT will continue to follow acutely for safe mobility progression.  Follow Up Recommendations  SNF     Equipment Recommendations  None recommended by PT    Recommendations for Other Services       Precautions / Restrictions Precautions Precautions: Back Required Braces or Orthoses: Spinal Brace Spinal Brace: Thoracolumbosacral orthotic;Applied in sitting position    Mobility  Bed Mobility Overal bed mobility: Needs Assistance Bed Mobility: Rolling;Sidelying to Sit;Sit to Sidelying Rolling: Mod assist Sidelying to sit: Mod assist     Sit to sidelying: Mod assist General bed mobility comments: Mod assist to roll to the left and come to sitting EOB with separate support at legs and then trunk, heavy reliance on bed rail for support.  Mod assist to lift both legs back into the bed and educate on  log roll and reverse log roll to protect her back.    Transfers Overall transfer level: Needs assistance Equipment used: Rolling walker (2 wheeled) Transfers: Sit to/from Stand Sit to Stand: Mod assist;From elevated surface         General transfer comment: Mod assist to stand from elevated bed  Ambulation/Gait Ambulation/Gait assistance: Mod assist Gait Distance (Feet): 3 Feet Assistive device: Rolling walker (2 wheeled) Gait Pattern/deviations: Step-to pattern     General Gait Details: Pt was able to side step up from base of the bed to head of the bed where she declined (adamantly) going to sit in the recliner chair.  Support needed at her trunk and to assist in moving RW.  Brace donned in sitting EOB. Education reinforced that straps go under her arm pits.           Balance Overall balance assessment: Needs assistance Sitting-balance support: Feet supported;Bilateral upper extremity supported Sitting balance-Leahy Scale: Poor Sitting balance - Comments: needs bil UE support for balance, reports dizziness upon sitting.    Standing balance support: Bilateral upper extremity supported Standing balance-Leahy Scale: Poor Standing balance comment: needs external support from RW and therapist.            10/30/19 1534  Symptom Behavior  Subjective history of current problem dizzy, poor historian saying she has all symptoms when asked, but does wear glasses and does have h/o cataract surgery.   Type of Dizziness  Spinning  Frequency of  Dizziness intermittent  Duration of Dizziness short, but intense  Symptom Nature Motion provoked;Positional;Intermittent  Aggravating Factors Lying supine;Activity in general  Relieving Factors Closing eyes;Head stationary;Rest  Progression of Symptoms No change since onset  Oculomotor Exam  Oculomotor Alignment Normal  Spontaneous Absent  Gaze-induced  Absent  Smooth Pursuits Saccades (this would be considered normal for her age)   Positional Testing  Dix-Hallpike Dix-Hallpike Right  Dix-Hallpike Right  Dix-Hallpike Right Duration 25  Dix-Hallpike Right Symptoms Downbeat, right rotatory nystagmus  Cognition  Cognition Comment Impaired see cognitive section for details.    10/30/2019 preformed Epley's x1 to the right only once due to limited by back pain.  Used bed in trendelenberg, and although slow to start after about 10 seconds the R upward rotating nystagmus started and ceased <30 seconds.  Unfortunately when returning to left side lying and then rolling back onto her back she had another significant episode of spinning and her head was still turned to the left making me suspicious of bil involvement.  Will continue to assess daily and treat as needed.                      Cognition Arousal/Alertness: Lethargic   Overall Cognitive Status: Impaired/Different from baseline Area of Impairment: Orientation;Attention;Memory;Following commands;Safety/judgement;Awareness;Problem solving                 Orientation Level: Disoriented to;Place;Situation Current Attention Level: Sustained Memory: Decreased short-term memory Following Commands: Follows one step commands inconsistently;Follows one step commands with increased time Safety/Judgement: Decreased awareness of safety;Decreased awareness of deficits Awareness: Intellectual Problem Solving: Slow processing;Difficulty sequencing;Requires verbal cues;Requires tactile cues General Comments: Pt moderately confused, processing slowly, compounded by [facility].        Exercises      General Comments General comments (skin integrity, edema, etc.): O2 sats on RA are 84% at lowest, when awake on 2 L O2 [place] she stayed in the mid to low 90s, as she floated off to sleep at the end of my session she drifted down to 88% and was snoring, so I increased her to 3 L while sleeping and increased her HOB.        Pertinent Vitals/Pain Pain Assessment: Faces Faces Pain  Scale: Hurts whole lot Pain Location: back Pain Descriptors / Indicators: Discomfort;Guarding Pain Intervention(s): Monitored during session;Limited activity within patient's tolerance;Repositioned    Home Living                      Prior Function            PT Goals (current goals can now be found in the care plan section) Acute Rehab PT Goals Patient Stated Goal: Husband wants her to get better, go to rehab before coming home.  Progress towards PT goals: Progressing toward goals    Frequency    Min 3X/week      PT Plan Discharge plan needs to be updated;Frequency needs to be updated    Co-evaluation              AM-PAC PT "6 Clicks" Mobility   Outcome Measure  Help needed turning from your back to your side while in a flat bed without using bedrails?: A Lot Help needed moving from lying on your back to sitting on the side of a flat bed without using bedrails?: A Lot Help needed moving to and from a bed to a chair (including a wheelchair)?: A Lot Help needed standing up from  a chair using your arms (e.g., wheelchair or bedside chair)?: A Lot Help needed to walk in hospital room?: A Lot Help needed climbing 3-5 steps with a railing? : Total 6 Click Score: 11    End of Session Equipment Utilized During Treatment: Back brace Activity Tolerance: Patient limited by pain Patient left: in bed;with call bell/phone within reach;with bed alarm set;with family/visitor present   PT Visit Diagnosis: Muscle weakness (generalized) (M62.81);Difficulty in walking, not elsewhere classified (R26.2);BPPV;Pain BPPV - Right/Left : Right;Left (possible bil) Pain - Right/Left:  (midline/incisional) Pain - part of body:  (back)     Time: 8138-8719 PT Time Calculation (min) (ACUTE ONLY): 42 min  Charges:  $Therapeutic Activity: 8-22 mins $[place] Rep Proc: 8-22 mins             Verdene Lennert, PT, DPT  Acute Rehabilitation [phone] pager [phone]) [phone]  office

## 2019-10-30 NOTE — Progress Notes (Signed)
ANTICOAGULATION CONSULT NOTE - Initial Consult  Pharmacy Consult for Heparin Indication: Atrial fibrillation  Allergies  Allergen Reactions  . Chantix [Varenicline Tartrate] Nausea Only and Other (See Comments)    Dizziness  . Oxytrol [Oxybutynin Base] Other (See Comments)    Dry mouth   . Varenicline Nausea Only    Patient Measurements: Height: 5\' 3"  (160 cm) Weight: 115.3 kg (254 lb 3.1 oz) IBW/kg (Calculated) : 52.4 Heparin dosing weight: 80 kg  Vital Signs: Temp: 98 F (36.7 C) (09/23 1148) Temp Source: Oral (09/23 1148) BP: 130/67 (09/23 1148) Pulse Rate: 62 (09/23 1148)  Labs: Recent Labs    10/28/19 1147 10/28/19 1147 10/29/19 0500 10/30/19 0416  HGB 10.1*   < > 9.9* 10.1*  HCT 32.6*  --  31.8* 32.2*  PLT 224  --  236 261  CREATININE 0.76  --  0.79 0.84   < > = values in this interval not displayed.    Estimated Creatinine Clearance: 67.6 mL/min (by C-G formula based on SCr of 0.84 mg/dL).   Medical History: Past Medical History:  Diagnosis Date  . Allergy   . Diabetes mellitus    type ii  . Hyperlipidemia   . Hypertension     Assessment: 79 yo female with new onset afib with RVR to be placed on a heparin drip. Patient with recent neurosurgical procedure on 9/19, therefore will not bolus patient. No anticoagulation at home PTA.   Goal of Therapy:  Heparin level 0.3-0.7 units/ml Monitor platelets by anticoagulation protocol: Yes   Plan: D/C sq heparin (last dose 0548) Heparin drip at 1200 units/hr No bolus Heparin level in 8 hours Heparin level and CBC daily  Mistee Soliman A. Levada Dy, PharmD, BCPS, FNKF Clinical Pharmacist [place] Please utilize Amion for appropriate phone number to reach the unit pharmacist ([place])   10/30/2019,11:54 AM

## 2019-10-30 NOTE — Significant Event (Addendum)
Rapid Response Event Note   Reason for Call :  Afib RVR.   Around 2240, pt's HR increased to 150-160s (Afib). Bedside RN gave  PRN dose of 2.5mg  metoprolol IV at 2302 with no change in HR. RN then called RRT who asked RN to notify MD of continued Afib RVR.  Initial Focused Assessment:  Pt laying in bed with eyes closed. Pt will awaken easily, is alert and oriented, c/o 8/10 pain in back and some nausea. Pt denies chest pain/SOB at this time. Lungs diminished t/o. Skin warm and dry.  T-98.8, HR-150, BP-157/102, RR-20, SpO2-98% on 2L [place].    Interventions:  EKG(Afib)-done PTA RRT 2.5mg  IV metoprolol PRN dose given at 2302-done PTA RRT(this did not lower HR) Zofran 4mg  IV given (already ordered PRN) 15mg  cardizem given IV 3mg  morphine IV  Plan of Care:  Pt uncomfortable in bed d/t back pain. Pt's HR down to 120-130s after cardizem given. Morphine 3mg  IV given x 1. Monitor response. If pt pain under control and HR still>115, notify MD-pt may need cardizem gtt. Please call RRT if further assistance needed.    Update: 0145-HR sustaining 120-140s>Cardizem gtt ordered.    Event Summary:   MD Notified: Dr. Myna Hidalgo notified PTA RRT and came to bedside [facility] [place]  Dillard Essex, RN

## 2019-10-31 LAB — CBC
HCT: 33 % — ABNORMAL LOW (ref 36.0–46.0)
Hemoglobin: 10.5 g/dL — ABNORMAL LOW (ref 12.0–15.0)
MCH: 30.3 pg (ref 26.0–34.0)
MCHC: 31.8 g/dL (ref 30.0–36.0)
MCV: 95.4 fL (ref 80.0–100.0)
Platelets: 321 10*3/uL (ref 150–400)
RBC: 3.46 MIL/uL — ABNORMAL LOW (ref 3.87–5.11)
RDW: 13.6 % (ref 11.5–15.5)
WBC: 11.9 10*3/uL — ABNORMAL HIGH (ref 4.0–10.5)
nRBC: 0 % (ref 0.0–0.2)

## 2019-10-31 LAB — GLUCOSE, CAPILLARY
Glucose-Capillary: 135 mg/dL — ABNORMAL HIGH (ref 70–99)
Glucose-Capillary: 136 mg/dL — ABNORMAL HIGH (ref 70–99)
Glucose-Capillary: 124 mg/dL — ABNORMAL HIGH (ref 70–99)
Glucose-Capillary: 68 mg/dL — ABNORMAL LOW (ref 70–99)
Glucose-Capillary: 126 mg/dL — ABNORMAL HIGH (ref 70–99)

## 2019-10-31 LAB — COMPREHENSIVE METABOLIC PANEL
ALT: 35 U/L (ref 0–44)
AST: 29 U/L (ref 15–41)
Albumin: 2.8 g/dL — ABNORMAL LOW (ref 3.5–5.0)
Alkaline Phosphatase: 68 U/L (ref 38–126)
Anion gap: 10 (ref 5–15)
BUN: 19 mg/dL (ref 8–23)
CO2: 26 mmol/L (ref 22–32)
Calcium: 9.4 mg/dL (ref 8.9–10.3)
Chloride: 102 mmol/L (ref 98–111)
Creatinine, Ser: 0.76 mg/dL (ref 0.44–1.00)
GFR calc Af Amer: >60 mL/min (ref >60)
GFR calc non Af Amer: >60 mL/min (ref >60)
Glucose, Bld: 92 mg/dL (ref 70–99)
Potassium: 3.6 mmol/L (ref 3.5–5.1)
Sodium: 138 mmol/L (ref 135–145)
Total Bilirubin: 0.2 mg/dL — ABNORMAL LOW (ref 0.3–1.2)
Total Protein: 6.4 g/dL — ABNORMAL LOW (ref 6.5–8.1)

## 2019-10-31 LAB — HEPARIN LEVEL (UNFRACTIONATED): Heparin Unfractionated: 0.25 IU/mL — ABNORMAL LOW (ref 0.30–0.70)

## 2019-10-31 MED ORDER — LABETALOL HCL 5 MG/ML IV SOLN
10.0000 mg | INTRAVENOUS | Status: DC | PRN
  Filled 2019-10-31: qty 4

## 2019-10-31 MED ORDER — BISACODYL 5 MG PO TBEC
5.0000 mg | DELAYED_RELEASE_TABLET | Freq: Once | ORAL | Status: AC
  Administered 2019-10-31: 5 mg via ORAL
  Filled 2019-10-31: qty 1

## 2019-10-31 MED ORDER — APIXABAN 5 MG PO TABS
5.0000 mg | ORAL_TABLET | Freq: Two times a day (BID) | ORAL | Status: DC
  Administered 2019-10-31 – 2019-11-04 (×9): 5 mg via ORAL
  Filled 2019-10-31 (×10): qty 1

## 2019-10-31 MED ORDER — NYSTATIN 100000 UNIT/ML MT SUSP
5.0000 mL | Freq: Four times a day (QID) | OROMUCOSAL | Status: DC
  Administered 2019-10-31 – 2019-11-04 (×15): 500000 [IU] via ORAL
  Filled 2019-10-31 (×15): qty 5

## 2019-10-31 MED ORDER — ENSURE ENLIVE PO LIQD
237.0000 mL | Freq: Two times a day (BID) | ORAL | Status: DC
  Administered 2019-11-01: 237 mL via ORAL

## 2019-10-31 NOTE — Progress Notes (Signed)
ANTICOAGULATION CONSULT NOTE - Initial Consult  Pharmacy Consult for transition heparin to apixaban Indication: atrial fibrillation  Allergies  Allergen Reactions  . Chantix [Varenicline Tartrate] Nausea Only and Other (See Comments)    Dizziness  . Oxytrol [Oxybutynin Base] Other (See Comments)    Dry mouth   . Varenicline Nausea Only    Patient Measurements: Height: 5\' 3"  (160 cm) Weight: 114.9 kg (253 lb 4.9 oz) IBW/kg (Calculated) : 52.4 Heparin Dosing Weight: 80 kg  Vital Signs: Temp: 98.8 F (37.1 C) (09/24 0808) Temp Source: Axillary (09/24 0808) BP: 150/62 (09/24 0808) Pulse Rate: 85 (09/24 0808)  Labs: Recent Labs    10/29/19 0500 10/29/19 0500 10/30/19 0416 10/30/19 2104 10/31/19 0907  HGB 9.9*   < > 10.1*  --  10.5*  HCT 31.8*  --  32.2*  --  33.0*  PLT 236  --  261  --  321  HEPARINUNFRC  --   --   --  0.44 0.25*  CREATININE 0.79  --  0.84  --  0.76   < > = values in this interval not displayed.    Estimated Creatinine Clearance: 70.8 mL/min (by C-G formula based on SCr of 0.76 mg/dL).   Medical History: Past Medical History:  Diagnosis Date  . Allergy   . Diabetes mellitus    type ii  . Hyperlipidemia   . Hypertension     Medications:  Medications Prior to Admission  Medication Sig Dispense Refill Last Dose  . B Complex-C (B-COMPLEX WITH VITAMIN C) tablet Take 1 tablet by mouth daily.   Past Week at Unknown time  . Cholecalciferol (VITAMIN D3) 50 MCG (2000 UT) TABS Take 2,000 Units by mouth daily.   Past Week at Unknown time  . escitalopram (LEXAPRO) 10 MG tablet Take 10 mg by mouth daily.   Past Week at Unknown time  . meclizine (ANTIVERT) 25 MG tablet Take 25 mg by mouth every 6 (six) hours as needed for dizziness.   unk  . Multiple Vitamins-Minerals (MULTIVITAMIN WITH MINERALS) tablet Take 1 tablet by mouth daily.     Past Week at Unknown time  . olmesartan (BENICAR) 40 MG tablet Take 40 mg by mouth daily.   10/23/2019  . pioglitazone  (ACTOS) 30 MG tablet Take 30 mg by mouth Daily.    10/23/2019  . HYDROcodone-acetaminophen (NORCO/VICODIN) 5-325 MG tablet Take 1-2 tablets by mouth every 4 (four) hours as needed for moderate pain (pain score 4-6). (Patient not taking: Reported on 10/25/2019) 40 tablet 0 Not Taking at Unknown time  . ONE TOUCH ULTRA TEST test strip      . TRULICITY 3.79 KW/4.0XB SOPN Inject 0.75 mg into the skin every Tuesday at 6 PM.       Assessment: 79 yo female with new onset afib with RVR to transition from heparin drip to apixaban this morning. Patient with recent percutaneous neurosurgical procedure on 9/19. No anticoagulation at home PTA.   Goal of Therapy:  Anticoagulation Monitor platelets by anticoagulation protocol: Yes   Plan:  Stop heparin infusion. Give apixaban 5 mg PO twice daily Monitor for signs and symptoms of bleeding   Thank you for allowing [place] to participate in this patients care.   Jens Som, PharmD Please see amion for complete clinical pharmacist phone list. 10/31/2019 10:49 AM

## 2019-10-31 NOTE — TOC Progression Note (Signed)
Transition of Care [facility]) - Progression Note    Patient Details  Name: Breanna Shields MRN: [id number] Date of Birth: 1940/12/16  Transition of Care [facility]) CM/SW [place], [place] Phone Number: 10/31/2019, 4:19 PM  Clinical Narrative:   CSW spoke with daughter, [name], several times throughout the day today about SNF placement. She had asked about [facility], [facility], and [facility], and CSW confirmed bed availability at each facility. After discussing with Melissa, they have been wanting to move the patient and husband into [facility] long term, so if they would take the patient, that would be where they would want to go, so they could both move in there. [facility] can take the patient on Monday. CSW sent in auth request to [facility], still waiting on authorization for SNF and PTAR. CSW provided weekend contact to [facility] for any questions, and updated MD about barriers to discharge. CSW to follow.    Expected Discharge Plan: Skilled Nursing Facility Barriers to Discharge: Ship broker, Continued Medical Work up  Expected Discharge Plan and Services Expected Discharge Plan: [place]: [place] arrangements for the past 2 months: Single Family Home                                       Social Determinants of Health (SDOH) Interventions    Readmission Risk Interventions No flowsheet data found.

## 2019-10-31 NOTE — Progress Notes (Signed)
Attempted to see pt for treatment. Pt refusing treatment today stating she is tired and wants to just sleep. Talked to husband and pt at length about the need to get up and out of bed and pt continued to close eyes and state shew would not get up. Will attempt back as schedule allows. [place], [place] [phone]

## 2019-10-31 NOTE — TOC Benefit Eligibility Note (Signed)
Transition of Care [facility]) Benefit Eligibility Note    Patient Details  Name: TAMBRA MULLER MRN: [id number] Date of Birth: [birth year]   Medication/Dose: Eliquis 5mg . bid 30 day supply and Xarelto 20 mg. daily for 30 day supply  Covered?: Yes  Tier: 3 Drug (Xarelto tier 2)  Prescription Coverage Preferred Pharmacy: [facility]  Spoke with Person/Company/Phone Number:: Jackolyn Confer. W/[facility] Help Desk PH# [phone]  Co-Pay: Eliquis $114.83 and Xarelto $113.17  Prior Approval: No  Deductible:  (no deductible)       Shelda Altes Phone Number: 10/31/2019, 10:26 AM

## 2019-10-31 NOTE — Progress Notes (Signed)
Benefits check submitted for eliquis and xarelto. TOC following.

## 2019-10-31 NOTE — Progress Notes (Signed)
Physical Therapy Treatment Patient Details Name: Breanna Shields MRN: [id number] DOB: [birth year] Today's Date: 10/31/2019    History of Present Illness 79 y.o. female admitted on 10/24/19 after falling with T9 vertebral body fx.  Pt underwent T7-11 pedical screw placement and ORIF of T9 on 10/26/19.  Other medical issues being worked on this admission are A-fib with RVR, delirium, hypoxia requiring supplemental O2 (was not on O2 at baseline), and UTI.  Pt with significant PMH of HTN, DM, bil TKA, obesity.      PT Comments    Pt is progressing with gait and mobility despite blood sugar being 68 at the end of our session.  R BPPV seems to have cleared and L [employer] me 2 low amp left upwardly rotating beats, but no real reports of symptoms, so I did not treat the left due to how painful the treatment positions are and without a large symptom response I wanted to focus on getting OOB to the chair today.  PT will continue to follow acutely for safe mobility progression.  Re-check L and R posterior canals again if she continues to report dizziness.    Follow Up Recommendations  SNF     Equipment Recommendations  None recommended by PT    Recommendations for Other Services       Precautions / Restrictions Precautions Precautions: Back Precaution Booklet Issued:  (given previously) Precaution Comments: pt unable to state due to confusion; cues and guidance to maintain during session Required Braces or Orthoses: Spinal Brace Spinal Brace: Thoracolumbosacral orthotic;Applied in sitting position    Mobility  Bed Mobility Overal bed mobility: Needs Assistance Bed Mobility: Rolling;Sidelying to Sit Rolling: Min assist Sidelying to sit: Mod assist       General bed mobility comments: Min assist to roll and then mod assist to progress legs over EOB and help support trunk to come to sitting EOB.  Pt relying heavily on railing.   Transfers Overall transfer level: Needs  assistance Equipment used: Rolling walker (2 wheeled) Transfers: Sit to/from Stand Sit to Stand: From elevated surface;Min assist         General transfer comment: Min assist to come to standing after donning brace EOB.  Cues for safe hand placement.   Ambulation/Gait Ambulation/Gait assistance: Min assist Gait Distance (Feet): 5 Feet Assistive device: Rolling walker (2 wheeled) Gait Pattern/deviations: Step-to pattern;Shuffle;Trunk flexed     General Gait Details: Pt with a bit of an anterior thrust to her trunk ahead of her feet.  Support of RW and wobbly steps, but able to come away from the bed and walk to the recliner chair.     Stairs             Wheelchair Mobility    Modified Rankin (Stroke Patients Only)       Balance Overall balance assessment: Needs assistance Sitting-balance support: Feet supported Sitting balance-Leahy Scale: Fair Sitting balance - Comments: close supervision EOB, not challenged   Standing balance support: Bilateral upper extremity supported Standing balance-Leahy Scale: Poor Standing balance comment: Needs external support from therapist and RW.                            Cognition Arousal/Alertness: Lethargic Behavior During Therapy: Anxious;Impulsive;Restless Overall Cognitive Status: Impaired/Different from baseline Area of Impairment: Orientation;Attention;Memory;Following commands;Safety/judgement;Awareness;Problem solving                 Orientation Level: Disoriented to;Place;Time Current Attention Level:  Sustained Memory: Decreased short-term memory Following Commands: Follows one step commands inconsistently;Follows one step commands with increased time Safety/Judgement: Decreased awareness of safety;Decreased awareness of deficits Awareness: Intellectual Problem Solving: Slow processing;Difficulty sequencing;Requires verbal cues;Requires tactile cues General Comments: Pt moderately confused,  processing slowly, compounded by [facility].  Improved over last session.       Exercises      General Comments General comments (skin integrity, edema, etc.): Off of O2 today and on RA, sats in the 90s.       Pertinent Vitals/Pain Pain Assessment: Faces Faces Pain Scale: Hurts little more Pain Location: back Pain Descriptors / Indicators: Discomfort;Guarding Pain Intervention(s): Limited activity within patient's tolerance;Monitored during session;Repositioned    Home Living                      Prior Function            PT Goals (current goals can now be found in the care plan section) Acute Rehab PT Goals Patient Stated Goal: Husband wants her to get better, go to rehab before coming home.  Progress towards PT goals: Progressing toward goals    Frequency    Min 3X/week      PT Plan Discharge plan needs to be updated;Frequency needs to be updated    Co-evaluation              AM-PAC PT "6 Clicks" Mobility   Outcome Measure  Help needed turning from your back to your side while in a flat bed without using bedrails?: A Little Help needed moving from lying on your back to sitting on the side of a flat bed without using bedrails?: A Lot Help needed moving to and from a bed to a chair (including a wheelchair)?: A Little Help needed standing up from a chair using your arms (e.g., wheelchair or bedside chair)?: A Little Help needed to walk in hospital room?: A Little Help needed climbing 3-5 steps with a railing? : Total 6 Click Score: 15    End of Session Equipment Utilized During Treatment: Back brace Activity Tolerance: Patient limited by pain Patient left: in bed;with family/visitor present;in chair;with call bell/phone within reach;with chair alarm set Nurse Communication: Mobility status PT Visit Diagnosis: Muscle weakness (generalized) (M62.81);Difficulty in walking, not elsewhere classified (R26.2);BPPV;Pain BPPV - Right/Left : Right;Left (possible  bil) Pain - Right/Left:  (midline/incisional) Pain - part of body:  (back)     Time: 4097-3532 PT Time Calculation (min) (ACUTE ONLY): 38 min  Charges:  $Therapeutic Activity: 38-52 mins                     Verdene Lennert, PT, DPT  Acute Rehabilitation [phone] pager [phone]) [phone] office

## 2019-10-31 NOTE — TOC Progression Note (Signed)
Transition of Care [facility]) - Progression Note    Patient Details  Name: Breanna Shields MRN: [id number] Date of Birth: 02/28/1940  Transition of Care [facility]) CM/SW [place], [place] Phone Number: 10/31/2019, 4:19 PM  Clinical Narrative:   CSW reached out to daughter, Breanna Shields, to discuss SNF placement. Melissa was at work, said she was trying to leave early and would call CSW back after. If not today, she has off tomorrow and will call CSW then. CSW to follow.    Expected Discharge Plan: Skilled Nursing Facility Barriers to Discharge: Ship broker, Continued Medical Work up  Expected Discharge Plan and Services Expected Discharge Plan: [place] Choice: [place] arrangements for the past 2 months: Single Family Home                                       Social Determinants of Health (SDOH) Interventions    Readmission Risk Interventions No flowsheet data found.

## 2019-10-31 NOTE — Progress Notes (Signed)
PROGRESS NOTE    SUMAYYA MUHA  GYI:[id number] DOB: Dec 11, 1940 DOA: 10/24/2019 PCP: Lorne Skeens, MD   Brief Narrative: 79 year old with past medical history significant for hypertension, hyperlipidemia, diabetes type 2, morbid obesity with BMI of 41 presents to the emergency department for evaluation of back pain after a fall.  Evaluation in the ED revealed T9 vertebral body fracture.  Neurosurgery was consulted.  Patient was hospitalized for further management. Overnight of  9/23 patient developed A. fib with RVR.    Assessment & Plan:   Principal Problem:   T9 vertebral fracture (HCC) Active Problems:   Mixed dyslipidemia   Hypertension, essential   DM (diabetes mellitus) ([place])   Depression   Leukocytosis   Closed fracture of thoracic spine without spinal cord lesion (HCC)   Atrial fibrillation with RVR (HCC)  1-A. fib with RVR: -Patient was given RVR. She was a started on Cardizem same infusion. She was already on metoprolol. Her metoprolol dose was increased and subsequently still TSN infusion was discontinued TSH was normal. Echocardiogram: Normal ejection fraction no wall motion abnormality. Evaluation was discussed with neurosurgery okay to start heparin and if needed Eliquis She was transitioned to Eliquis today Heart rate better controlled.  2-T9 vertebral compression fracture Secondary to a fall. Neurosurgery was consulted. Patient underwent surgery on 9/19; thoracic 7 thoracic 11 percutaneous pedicle screw placement, ORIF T9 fracture with segmental pedicle screw fixation T7-T11. Continue with pain control and bowel regimen. Patient will require placement in skilled nursing facility.  Delirium: -Secondary to hospital stay, surgery and anesthesia as well. -She is alert and oriented this morning.  Still agitated at times.  Hypoxia; X-ray show atelectasis. Desaturation adamantly while she is sleeping, suggesting patient may have undiagnosed sleep  apnea. She will need sleep study, as an outpatient  Essential hypertension: Continue on ARB, continue metoprolol  Hyperlipidemia: Continue with statins  History of depression and anxiety: Continue with Lexapro  Normocytic anemia/leukocytosis: No evidence of overt bleeding. Hemoglobin stable  Diabetes  mellitus type II: Continue with a sliding scale insulin. Globin A1c 7.9.  UTI with E. coli and Klebsiella/urinary retention: UA showed moderate leukocytes and positive nitrite.  Blood cell 11-20. Growth E. coli and Klebsiella. Continue with ceftriaxone plan of 7 days. Continue voiding  trial today  Severe obesity: BMI 45    Estimated body mass index is 44.87 kg/m as calculated from the following:   Height as of this encounter: 5\' 3"  (1.6 m).   Weight as of this encounter: 114.9 kg.   DVT prophylaxis: Eliquis Code Status: Full code Family Communication: Daughter updated over the phone Disposition Plan:  Status is: Inpatient  Remains inpatient appropriate because:Ongoing active pain requiring inpatient pain management   Dispo:  Patient From: Home  Planned Disposition: [facility]  Expected discharge date: 11/01/19  Medically stable for discharge: No         Consultants:   Neurosurgery  Procedures:   thoracic 7 thoracic 11 percutaneous pedicle screw placement, ORIF T9 fracture with segmental pedicle screw fixation T7-T11.    Antimicrobials:  Ceftriaxone  Subjective: Patient was sitting in the recliner, she started to move and getting anxious and wanted to go back to bed.  She is impulsive at times.  She is alert and oriented x3.  Objective: Vitals:   10/30/19 2008 10/30/19 2355 10/31/19 0404 10/31/19 0500  BP: (!) 148/93 (!) 169/59 (!) 167/68   Pulse: 76 (!) 58 78   Resp: 20 16 18    Temp: 98.6  F (37 C) (!) 97.5 F (36.4 C) 98.2 F (36.8 C)   TempSrc: Axillary Oral Axillary   SpO2: 94% 95% 96%   Weight:    114.9 kg  Height:         Intake/Output Summary (Last 24 hours) at 10/31/2019 0729 Last data filed at 10/31/2019 0404 Gross per 24 hour  Intake 409.16 ml  Output 750 ml  Net -340.84 ml   Filed Weights   10/26/19 0316 10/30/19 0450 10/31/19 0500  Weight: 107.7 kg 115.3 kg 114.9 kg    Examination:  General exam: Appears calm and comfortable  Respiratory system: Clear to auscultation. Respiratory effort normal. Cardiovascular system: S1 & S2 heard, RRR. No JVD, murmurs, rubs, gallops or clicks. No pedal edema. Gastrointestinal system: Abdomen is nondistended, soft and nontender. No organomegaly or masses felt. Normal bowel sounds heard. Central nervous system: Alert and oriented. No focal neurological deficits. Extremities: Symmetric 5 x 5 power.    Data Reviewed: I have personally reviewed following labs and imaging studies  CBC: Recent Labs  Lab 10/26/19 0705 10/27/19 1046 10/28/19 1147 10/29/19 0500 10/30/19 0416  WBC 10.7* 19.7* 17.4* 13.9* 11.0*  HGB 11.3* 10.4* 10.1* 9.9* 10.1*  HCT 36.5 33.9* 32.6* 31.8* 32.2*  MCV 97.9 96.0 97.0 96.1 96.7  PLT 230 234 224 236 824   Basic Metabolic Panel: Recent Labs  Lab 10/25/19 0058 10/25/19 1400 10/26/19 0705 10/27/19 1046 10/28/19 1147 10/29/19 0500 10/30/19 0416  NA   < >  --  139 139 141 140 139  K   < >  --  3.9 3.8 4.0 4.0 4.1  CL   < >  --  104 106 108 106 106  CO2   < >  --  27 24 24 23 22   GLUCOSE   < >  --  167* 164* 164* 231* 169*  BUN   < >  --  13 14 15 16 18   CREATININE   < >  --  0.74 0.70 0.76 0.79 0.84  CALCIUM   < >  --  8.9 8.8* 8.6* 9.2 9.1  MG  --  1.8  --   --   --   --  2.0  PHOS  --  2.4*  --   --   --   --   --    < > = values in this interval not displayed.   GFR: Estimated Creatinine Clearance: 67.4 mL/min (by C-G formula based on SCr of 0.84 mg/dL). Liver Function Tests: Recent Labs  Lab 10/26/19 0705 10/27/19 1046 10/28/19 1147 10/29/19 0500 10/30/19 0416  AST 17 25 27 29 28   ALT 20 24 24 28  32   ALKPHOS 68 62 65 69 68  BILITOT 0.6 0.7 0.9 1.1 0.9  PROT 6.3* 6.1* 5.9* 6.4* 6.0*  ALBUMIN 3.0* 2.9* 2.7* 2.7* 2.5*   No results for input(s): LIPASE, AMYLASE in the last 168 hours. No results for input(s): AMMONIA in the last 168 hours. Coagulation Profile: No results for input(s): INR, PROTIME in the last 168 hours. Cardiac Enzymes: No results for input(s): CKTOTAL, CKMB, CKMBINDEX, TROPONINI in the last 168 hours. BNP (last 3 results) No results for input(s): PROBNP in the last 8760 hours. HbA1C: No results for input(s): HGBA1C in the last 72 hours. CBG: Recent Labs  Lab 10/30/19 0626 10/30/19 1148 10/30/19 1551 10/30/19 2104 10/31/19 0623  GLUCAP 169* 134* 194* 82 135*   Lipid Profile: No results for input(s): CHOL, HDL, LDLCALC, TRIG,  CHOLHDL, LDLDIRECT in the last 72 hours. Thyroid Function Tests: Recent Labs    10/30/19 0416  TSH 1.778   Anemia Panel: Recent Labs    10/30/19 0416  VITAMINB12 1,459*  FOLATE 26.0  FERRITIN 216  TIBC 241*  IRON 30  RETICCTPCT 1.7   Sepsis Labs: No results for input(s): PROCALCITON, LATICACIDVEN in the last 168 hours.  Recent Results (from the past 240 hour(s))  SARS Coronavirus 2 by RT PCR (hospital order, performed in [facility] hospital lab) Nasopharyngeal Nasopharyngeal Swab     Status: None   Collection Time: 10/25/19  7:38 AM   Specimen: Nasopharyngeal Swab  Result Value Ref Range Status   SARS Coronavirus 2 NEGATIVE NEGATIVE Final    Comment: (NOTE) SARS-CoV-2 target nucleic acids are NOT DETECTED.  The SARS-CoV-2 RNA is generally detectable in upper and lower respiratory specimens during the acute phase of infection. The lowest concentration of SARS-CoV-2 viral copies this assay can detect is 250 copies / mL. A negative result does not preclude SARS-CoV-2 infection and should not be used as the sole basis for treatment or other patient management decisions.  A negative result may occur with improper specimen  collection / handling, submission of specimen other than nasopharyngeal swab, presence of viral mutation(s) within the areas targeted by this assay, and inadequate number of viral copies (<250 copies / mL). A negative result must be combined with clinical observations, patient history, and epidemiological information.  Fact Sheet for Patients:   StrictlyIdeas.no  Fact Sheet for Healthcare Providers: BankingDealers.co.za  This test is not yet approved or  cleared by the [place] FDA and has been authorized for detection and/or diagnosis of SARS-CoV-2 by FDA under an Emergency Use Authorization (EUA).  This EUA will remain in effect (meaning this test can be used) for the duration of the COVID-19 declaration under Section 564(b)(1) of the Act, 21 U.S.C. section 360bbb-3(b)(1), unless the authorization is terminated or revoked sooner.  Performed at [facility], [place] [address] [place], [place] [address]   Surgical pcr screen     Status: None   Collection Time: 10/25/19 10:01 PM   Specimen: Nasal Mucosa; Nasal Swab  Result Value Ref Range Status   MRSA, PCR NEGATIVE NEGATIVE Final   Staphylococcus aureus NEGATIVE NEGATIVE Final    Comment: (NOTE) The Xpert SA Assay (FDA approved for NASAL specimens in patients 58 years of age and older), is one component of a comprehensive surveillance program. It is not intended to diagnose infection nor to guide or monitor treatment. Performed at [facility], [place] [address], [place], [place] [address]   Culture, Urine     Status: Abnormal   Collection Time: 10/26/19 10:09 AM   Specimen: Urine, Random  Result Value Ref Range Status   Specimen Description URINE, RANDOM  Final   Special Requests   Final    A URINE Performed at [facility], [place] [address], [place], [place] [address]    Culture (A)  Final    >=100,000 COLONIES/mL KLEBSIELLA PNEUMONIAE >=100,000  COLONIES/mL ESCHERICHIA COLI    Report Status 10/28/2019 FINAL  Final   Organism ID, Bacteria KLEBSIELLA PNEUMONIAE (A)  Final   Organism ID, Bacteria ESCHERICHIA COLI (A)  Final      Susceptibility   Escherichia coli - MIC*    AMPICILLIN <=2 SENSITIVE Sensitive     CEFAZOLIN <=4 SENSITIVE Sensitive     CEFTRIAXONE <=0.25 SENSITIVE Sensitive     CIPROFLOXACIN <=0.25  SENSITIVE Sensitive     GENTAMICIN <=1 SENSITIVE Sensitive     IMIPENEM <=0.25 SENSITIVE Sensitive     NITROFURANTOIN <=16 SENSITIVE Sensitive     TRIMETH/SULFA <=20 SENSITIVE Sensitive     AMPICILLIN/SULBACTAM <=2 SENSITIVE Sensitive     PIP/TAZO <=4 SENSITIVE Sensitive     * >=100,000 COLONIES/mL ESCHERICHIA COLI   Klebsiella pneumoniae - MIC*    AMPICILLIN >=32 RESISTANT Resistant     CEFAZOLIN <=4 SENSITIVE Sensitive     CEFTRIAXONE <=0.25 SENSITIVE Sensitive     CIPROFLOXACIN <=0.25 SENSITIVE Sensitive     GENTAMICIN <=1 SENSITIVE Sensitive     IMIPENEM <=0.25 SENSITIVE Sensitive     NITROFURANTOIN <=16 SENSITIVE Sensitive     TRIMETH/SULFA <=20 SENSITIVE Sensitive     AMPICILLIN/SULBACTAM 8 SENSITIVE Sensitive     PIP/TAZO <=4 SENSITIVE Sensitive     * >=100,000 COLONIES/mL KLEBSIELLA PNEUMONIAE         Radiology Studies: ECHOCARDIOGRAM COMPLETE  Result Date: 10/30/2019    ECHOCARDIOGRAM REPORT   Patient Name:   ANDREANNA MIKOLAJCZAK Date of Exam: 10/30/2019 Medical Rec #:  [id number]        Height:       63.0 in Accession #:    [id number]       Weight:       254.2 lb Date of Birth:  1940/04/15         BSA:          2.141 m Patient Age:    61 years         BP:           130/67 mmHg Patient Gender: F                HR:           62 bpm. Exam Location:  Inpatient Procedure: 2D Echo Indications:    atrial fibrillation 427.31  History:        Patient has no prior history of Echocardiogram examinations.                 Risk Factors:Diabetes, Hypertension, Dyslipidemia and Former                 Smoker.  Sonographer:     Jannett Celestine RDCS (AE) Referring Phys: [id number]  Sonographer Comments: Image acquisition challenging due to patient body habitus and restricted mobility. IMPRESSIONS  1. Left ventricular ejection fraction, by estimation, is 60 to 65%. The left ventricle has normal function. The left ventricle has no regional wall motion abnormalities. There is mild left ventricular hypertrophy. Left ventricular diastolic parameters are consistent with Grade II diastolic dysfunction (pseudonormalization).  2. Right ventricular systolic function is normal. The right ventricular size is normal.  3. The mitral valve is grossly normal. No evidence of mitral valve regurgitation.  4. The aortic valve is grossly normal. Aortic valve regurgitation is not visualized. FINDINGS  Left Ventricle: Left ventricular ejection fraction, by estimation, is 60 to 65%. The left ventricle has normal function. The left ventricle has no regional wall motion abnormalities. The left ventricular internal cavity size was normal in size. There is  mild left ventricular hypertrophy. Left ventricular diastolic parameters are consistent with Grade II diastolic dysfunction (pseudonormalization). Right Ventricle: The right ventricular size is normal. No increase in right ventricular wall thickness. Right ventricular systolic function is normal. Left Atrium: Left atrial size was normal in size. Right Atrium: Right atrial size was not assessed. Pericardium: There is no  evidence of pericardial effusion. Mitral Valve: The mitral valve is grossly normal. No evidence of mitral valve regurgitation. Tricuspid Valve: The tricuspid valve is grossly normal. Tricuspid valve regurgitation is mild. Aortic Valve: The aortic valve is grossly normal. Aortic valve regurgitation is not visualized. Pulmonic Valve: The pulmonic valve was normal in structure. Pulmonic valve regurgitation is not visualized. Aorta: The aortic root and ascending aorta are structurally normal, with  no evidence of dilitation. IAS/Shunts: The interatrial septum was not assessed.  LEFT VENTRICLE PLAX 2D LVIDd:         4.73 cm LVIDs:         2.98 cm LV PW:         1.19 cm LV IVS:        1.34 cm LVOT diam:     2.00 cm LV SV:         79 LV SV Index:   37 LVOT Area:     3.14 cm  LEFT ATRIUM           Index LA diam:      3.80 cm 1.77 cm/m LA Vol (A2C): 28.5 ml 13.31 ml/m  AORTIC VALVE LVOT Vmax:   96.00 cm/s LVOT Vmean:  70.100 cm/s LVOT VTI:    0.251 m  AORTA Ao Root diam: 3.20 cm MITRAL VALVE MV Area (PHT): 3.42 cm    SHUNTS MV Decel Time: 222 msec    Systemic VTI:  0.25 m MV E velocity: 94.70 cm/s  Systemic Diam: 2.00 cm Mertie Moores MD Electronically signed by Mertie Moores MD Signature Date/Time: 10/30/2019/3:52:11 PM    Final         Scheduled Meds: . B-complex with vitamin C  1 tablet Oral Daily  . celecoxib  200 mg Oral Q12H  . Chlorhexidine Gluconate Cloth  6 each Topical Daily  . cholecalciferol  2,000 Units Oral Daily  . escitalopram  10 mg Oral Daily  . insulin aspart  0-15 Units Subcutaneous TID WC  . insulin aspart  0-5 Units Subcutaneous QHS  . irbesartan  150 mg Oral Daily  . metoprolol tartrate  50 mg Oral BID  . multivitamin with minerals  1 tablet Oral Daily  . pioglitazone  30 mg Oral Daily  . senna  1 tablet Oral BID  . sodium chloride flush  3 mL Intravenous Q12H   Continuous Infusions: . sodium chloride    . cefTRIAXone (ROCEPHIN)  IV    . heparin 1,200 Units/hr (10/30/19 1225)     LOS: 6 days    Time spent: 35 minutes.    Elmarie Shiley, MD [facility]   If 7PM-7AM, please contact night-coverage [email or web address]  10/31/2019, 7:29 AM

## 2019-11-01 LAB — BASIC METABOLIC PANEL
Anion gap: 10 (ref 5–15)
BUN: 16 mg/dL (ref 8–23)
CO2: 26 mmol/L (ref 22–32)
Calcium: 9.2 mg/dL (ref 8.9–10.3)
Chloride: 105 mmol/L (ref 98–111)
Creatinine, Ser: 0.78 mg/dL (ref 0.44–1.00)
GFR calc Af Amer: >60 mL/min (ref >60)
GFR calc non Af Amer: >60 mL/min (ref >60)
Glucose, Bld: 148 mg/dL — ABNORMAL HIGH (ref 70–99)
Potassium: 4.3 mmol/L (ref 3.5–5.1)
Sodium: 141 mmol/L (ref 135–145)

## 2019-11-01 LAB — CBC
HCT: 33.7 % — ABNORMAL LOW (ref 36.0–46.0)
Hemoglobin: 10.7 g/dL — ABNORMAL LOW (ref 12.0–15.0)
MCH: 30.5 pg (ref 26.0–34.0)
MCHC: 31.8 g/dL (ref 30.0–36.0)
MCV: 96 fL (ref 80.0–100.0)
Platelets: 304 10*3/uL (ref 150–400)
RBC: 3.51 MIL/uL — ABNORMAL LOW (ref 3.87–5.11)
RDW: 13.6 % (ref 11.5–15.5)
WBC: 9.4 10*3/uL (ref 4.0–10.5)
nRBC: 0.2 % (ref 0.0–0.2)

## 2019-11-01 LAB — GLUCOSE, CAPILLARY
Glucose-Capillary: 141 mg/dL — ABNORMAL HIGH (ref 70–99)
Glucose-Capillary: 155 mg/dL — ABNORMAL HIGH (ref 70–99)
Glucose-Capillary: 74 mg/dL (ref 70–99)
Glucose-Capillary: 115 mg/dL — ABNORMAL HIGH (ref 70–99)

## 2019-11-01 LAB — MAGNESIUM: Magnesium: 2 mg/dL (ref 1.7–2.4)

## 2019-11-01 MED ORDER — ENSURE ENLIVE PO LIQD
237.0000 mL | Freq: Three times a day (TID) | ORAL | Status: DC
  Administered 2019-11-01 – 2019-11-04 (×9): 237 mL via ORAL

## 2019-11-01 NOTE — Progress Notes (Signed)
   Providing Compassionate, Quality Care - Together  NEUROSURGERY PROGRESS NOTE   S: No issues overnight. No new complaints   O: EXAM:  BP (!) 166/74 (BP Location: Left Arm)   Pulse 67   Temp 100.2 F (37.9 C) (Axillary)   Resp 18   Ht 5\' 3"  (1.6 m)   Wt 115 kg   SpO2 94%   BMI 44.91 kg/m   Awake, alert, oriented  Speech fluent, appropriate  CNs grossly intact  5/5 BUE/BLE  Incision c/d/i  ASSESSMENT:  79 y.o. female with  T9 fracture  Status post T7-11 percutaneous instrumentation  PLAN: -Continue PT OT -Skilled nursing facility pending -Pain control -No neuro changes    Thank you for allowing me to participate in this patient's care.  Please do not hesitate to call with questions or concerns.   Elwin Sleight, [facility] Cell: [phone]

## 2019-11-01 NOTE — Progress Notes (Signed)
PROGRESS NOTE    Breanna Shields  RUE:[id number] DOB: [birth year] DOA: 10/24/2019 PCP: Lorne Skeens, MD   Brief Narrative: 79 year old with past medical history significant for hypertension, hyperlipidemia, diabetes type 2, morbid obesity with BMI of 41 presents to the emergency department for evaluation of back pain after a fall.  Evaluation in the ED revealed T9 vertebral body fracture.  Neurosurgery was consulted.  Patient was hospitalized for further management. Overnight of  9/23 patient developed A. fib with RVR.    Assessment & Plan:   Principal Problem:   T9 vertebral fracture (HCC) Active Problems:   Mixed dyslipidemia   Hypertension, essential   DM (diabetes mellitus) ([place])   Depression   Leukocytosis   Closed fracture of thoracic spine without spinal cord lesion (HCC)   Atrial fibrillation with RVR (HCC)  1-A. fib with RVR: -Patient was given RVR. She was a started on Cardizem same infusion. Subsequently wean off cardizem  She was already on metoprolol. Her metoprolol dose was increased and subsequently still TSN infusion was discontinued TSH was normal. Echocardiogram: Normal ejection fraction no wall motion abnormality. Evaluation was discussed with neurosurgery okay to start heparin and if needed Eliquis She was transitioned to Eliquis 9/24 Heart rate better controlled.  2-T9 vertebral compression fracture Secondary to a fall. Neurosurgery was consulted. Patient underwent surgery on 9/19; thoracic 7 thoracic 11 percutaneous pedicle screw placement, ORIF T9 fracture with segmental pedicle screw fixation T7-T11. Continue with pain control and bowel regimen. Patient will require placement in skilled nursing facility.  Delirium: -Secondary to hospital stay, surgery and anesthesia as well. -less confuse per husband, but has been sleepy today during day. She said she couldn't sleep last night.  Hypoxia; X-ray show atelectasis. Desaturation adamantly while  she is sleeping, suggesting patient may have undiagnosed sleep apnea. She will need sleep study, as an outpatient Continue with oxygen while sleeping.   Essential hypertension: Continue on ARB, continue metoprolol  Hyperlipidemia: Continue with statins  History of depression and anxiety: Continue with Lexapro  Normocytic anemia/leukocytosis: No evidence of overt bleeding. Hemoglobin stable  Diabetes  mellitus type II: Continue with a sliding scale insulin. Globin A1c 7.9.  UTI with E. coli and Klebsiella/urinary retention: UA showed moderate leukocytes and positive nitrite.  Blood cell 11-20. Growth E. coli and Klebsiella. Continue with ceftriaxone plan of 7 days. Voiding trial 9/24  Severe obesity: BMI 45    Estimated body mass index is 44.91 kg/m as calculated from the following:   Height as of this encounter: 5\' 3"  (1.6 m).   Weight as of this encounter: 115 kg.   DVT prophylaxis: Eliquis Code Status: Full code Family Communication: Daughter updated over the phone 9/24 husband at bedside 9/25 Disposition Plan:  Status is: Inpatient  Remains inpatient appropriate because:Ongoing active pain requiring inpatient pain management   Dispo:  Patient From: Home  Planned Disposition: [place]  Expected discharge date: 11/01/19  Medically stable for discharge: No         Consultants:   Neurosurgery  Procedures:   thoracic 7 thoracic 11 percutaneous pedicle screw placement, ORIF T9 fracture with segmental pedicle screw fixation T7-T11.    Antimicrobials:  Ceftriaxone  Subjective: She is sleepy , wake and answer few question. Per husband she has been sleepy. She also sleep a ot at home Patient reported she couldn't sleep last night.   Objective: Vitals:   10/31/19 2304 11/01/19 0300 11/01/19 0444 11/01/19 1208  BP: (!) 175/74 (!) 166/74  Marland Kitchen)  150/62  Pulse: 72 67  83  Resp: 18 18  18   Temp: 98.3 F (36.8 C) 100.2 F (37.9 C)  98  F (36.7 C)  TempSrc: Oral Axillary    SpO2: 91% 94%  96%  Weight:   115 kg   Height:        Intake/Output Summary (Last 24 hours) at 11/01/2019 1412 Last data filed at 10/31/2019 1700 Gross per 24 hour  Intake 340 ml  Output 351 ml  Net -11 ml   Filed Weights   10/30/19 0450 10/31/19 0500 11/01/19 0444  Weight: 115.3 kg 114.9 kg 115 kg    Examination:  General exam: NAD Respiratory system: CTA Cardiovascular system: S 1, S 2 RRR Gastrointestinal system: BS present, soft, nt Central nervous system: sleepy, wake up answer few questions.  Extremities:  Moves both LE    Data Reviewed: I have personally reviewed following labs and imaging studies  CBC: Recent Labs  Lab 10/28/19 1147 10/29/19 0500 10/30/19 0416 10/31/19 0907 11/01/19 0616  WBC 17.4* 13.9* 11.0* 11.9* 9.4  HGB 10.1* 9.9* 10.1* 10.5* 10.7*  HCT 32.6* 31.8* 32.2* 33.0* 33.7*  MCV 97.0 96.1 96.7 95.4 96.0  PLT 224 236 261 321 735   Basic Metabolic Panel: Recent Labs  Lab 10/28/19 1147 10/29/19 0500 10/30/19 0416 10/31/19 0907 11/01/19 0616  NA 141 140 139 138 141  K 4.0 4.0 4.1 3.6 4.3  CL 108 106 106 102 105  CO2 24 23 22 26 26   GLUCOSE 164* 231* 169* 92 148*  BUN 15 16 18 19 16   CREATININE 0.76 0.79 0.84 0.76 0.78  CALCIUM 8.6* 9.2 9.1 9.4 9.2  MG  --   --  2.0  --  2.0   GFR: Estimated Creatinine Clearance: 70.8 mL/min (by C-G formula based on SCr of 0.78 mg/dL). Liver Function Tests: Recent Labs  Lab 10/27/19 1046 10/28/19 1147 10/29/19 0500 10/30/19 0416 10/31/19 0907  AST 25 27 29 28 29   ALT 24 24 28  32 35  ALKPHOS 62 65 69 68 68  BILITOT 0.7 0.9 1.1 0.9 0.2*  PROT 6.1* 5.9* 6.4* 6.0* 6.4*  ALBUMIN 2.9* 2.7* 2.7* 2.5* 2.8*   No results for input(s): LIPASE, AMYLASE in the last 168 hours. No results for input(s): AMMONIA in the last 168 hours. Coagulation Profile: No results for input(s): INR, PROTIME in the last 168 hours. Cardiac Enzymes: No results for input(s):  CKTOTAL, CKMB, CKMBINDEX, TROPONINI in the last 168 hours. BNP (last 3 results) No results for input(s): PROBNP in the last 8760 hours. HbA1C: No results for input(s): HGBA1C in the last 72 hours. CBG: Recent Labs  Lab 10/31/19 1638 10/31/19 1726 10/31/19 2118 11/01/19 0605 11/01/19 1206  GLUCAP 68* 124* 126* 141* 155*   Lipid Profile: No results for input(s): CHOL, HDL, LDLCALC, TRIG, CHOLHDL, LDLDIRECT in the last 72 hours. Thyroid Function Tests: Recent Labs    10/30/19 0416  TSH 1.778   Anemia Panel: Recent Labs    10/30/19 0416  VITAMINB12 1,459*  FOLATE 26.0  FERRITIN 216  TIBC 241*  IRON 30  RETICCTPCT 1.7   Sepsis Labs: No results for input(s): PROCALCITON, LATICACIDVEN in the last 168 hours.  Recent Results (from the past 240 hour(s))  SARS Coronavirus 2 by RT PCR (hospital order, performed in [facility] hospital lab) Nasopharyngeal Nasopharyngeal Swab     Status: None   Collection Time: 10/25/19  7:38 AM   Specimen: Nasopharyngeal Swab  Result Value Ref Range  Status   SARS Coronavirus 2 NEGATIVE NEGATIVE Final    Comment: (NOTE) SARS-CoV-2 target nucleic acids are NOT DETECTED.  The SARS-CoV-2 RNA is generally detectable in upper and lower respiratory specimens during the acute phase of infection. The lowest concentration of SARS-CoV-2 viral copies this assay can detect is 250 copies / mL. A negative result does not preclude SARS-CoV-2 infection and should not be used as the sole basis for treatment or other patient management decisions.  A negative result may occur with improper specimen collection / handling, submission of specimen other than nasopharyngeal swab, presence of viral mutation(s) within the areas targeted by this assay, and inadequate number of viral copies (<250 copies / mL). A negative result must be combined with clinical observations, patient history, and epidemiological information.  Fact Sheet for Patients:     StrictlyIdeas.no  Fact Sheet for Healthcare Providers: BankingDealers.co.za  This test is not yet approved or  cleared by the [place] FDA and has been authorized for detection and/or diagnosis of SARS-CoV-2 by FDA under an Emergency Use Authorization (EUA).  This EUA will remain in effect (meaning this test can be used) for the duration of the COVID-19 declaration under Section 564(b)(1) of the Act, 21 U.S.C. section 360bbb-3(b)(1), unless the authorization is terminated or revoked sooner.  Performed at [facility], [place] [address], [place], [place] [address]   Surgical pcr screen     Status: None   Collection Time: 10/25/19 10:01 PM   Specimen: Nasal Mucosa; Nasal Swab  Result Value Ref Range Status   MRSA, PCR NEGATIVE NEGATIVE Final   Staphylococcus aureus NEGATIVE NEGATIVE Final    Comment: (NOTE) The Xpert SA Assay (FDA approved for NASAL specimens in patients 97 years of age and older), is one component of a comprehensive surveillance program. It is not intended to diagnose infection nor to guide or monitor treatment. Performed at [facility], [place] [address] [place], [place] [address]   Culture, Urine     Status: Abnormal   Collection Time: 10/26/19 10:09 AM   Specimen: Urine, Random  Result Value Ref Range Status   Specimen Description URINE, RANDOM  Final   Special Requests   Final    A URINE Performed at [facility], [place] [address] [place],  [address]    Culture (A)  Final    >=100,000 COLONIES/mL KLEBSIELLA PNEUMONIAE >=100,000 COLONIES/mL ESCHERICHIA COLI    Report Status 10/28/2019 FINAL  Final   Organism ID, Bacteria KLEBSIELLA PNEUMONIAE (A)  Final   Organism ID, Bacteria ESCHERICHIA COLI (A)  Final      Susceptibility   Escherichia coli - MIC*    AMPICILLIN <=2 SENSITIVE Sensitive     CEFAZOLIN <=4 SENSITIVE Sensitive     CEFTRIAXONE <=0.25 SENSITIVE Sensitive      CIPROFLOXACIN <=0.25 SENSITIVE Sensitive     GENTAMICIN <=1 SENSITIVE Sensitive     IMIPENEM <=0.25 SENSITIVE Sensitive     NITROFURANTOIN <=16 SENSITIVE Sensitive     TRIMETH/SULFA <=20 SENSITIVE Sensitive     AMPICILLIN/SULBACTAM <=2 SENSITIVE Sensitive     PIP/TAZO <=4 SENSITIVE Sensitive     * >=100,000 COLONIES/mL ESCHERICHIA COLI   Klebsiella pneumoniae - MIC*    AMPICILLIN >=32 RESISTANT Resistant     CEFAZOLIN <=4 SENSITIVE Sensitive     CEFTRIAXONE <=0.25 SENSITIVE Sensitive     CIPROFLOXACIN <=0.25 SENSITIVE Sensitive     GENTAMICIN <=1 SENSITIVE Sensitive     IMIPENEM <=0.25 SENSITIVE Sensitive  NITROFURANTOIN <=16 SENSITIVE Sensitive     TRIMETH/SULFA <=20 SENSITIVE Sensitive     AMPICILLIN/SULBACTAM 8 SENSITIVE Sensitive     PIP/TAZO <=4 SENSITIVE Sensitive     * >=100,000 COLONIES/mL KLEBSIELLA PNEUMONIAE         Radiology Studies: ECHOCARDIOGRAM COMPLETE  Result Date: 10/30/2019    ECHOCARDIOGRAM REPORT   Patient Name:   REINE BRISTOW Date of Exam: 10/30/2019 Medical Rec #:  [id number]        Height:       63.0 in Accession #:    [id number]       Weight:       254.2 lb Date of Birth:  07-11-1940         BSA:          2.141 m Patient Age:    45 years         BP:           130/67 mmHg Patient Gender: F                HR:           62 bpm. Exam Location:  Inpatient Procedure: 2D Echo Indications:    atrial fibrillation 427.31  History:        Patient has no prior history of Echocardiogram examinations.                 Risk Factors:Diabetes, Hypertension, Dyslipidemia and Former                 Smoker.  Sonographer:    Jannett Celestine RDCS (AE) Referring Phys: [id number] [facility]  Sonographer Comments: Image acquisition challenging due to patient body habitus and restricted mobility. IMPRESSIONS  1. Left ventricular ejection fraction, by estimation, is 60 to 65%. The left ventricle has normal function. The left ventricle has no regional wall motion abnormalities. There is  mild left ventricular hypertrophy. Left ventricular diastolic parameters are consistent with Grade II diastolic dysfunction (pseudonormalization).  2. Right ventricular systolic function is normal. The right ventricular size is normal.  3. The mitral valve is grossly normal. No evidence of mitral valve regurgitation.  4. The aortic valve is grossly normal. Aortic valve regurgitation is not visualized. FINDINGS  Left Ventricle: Left ventricular ejection fraction, by estimation, is 60 to 65%. The left ventricle has normal function. The left ventricle has no regional wall motion abnormalities. The left ventricular internal cavity size was normal in size. There is  mild left ventricular hypertrophy. Left ventricular diastolic parameters are consistent with Grade II diastolic dysfunction (pseudonormalization). Right Ventricle: The right ventricular size is normal. No increase in right ventricular wall thickness. Right ventricular systolic function is normal. Left Atrium: Left atrial size was normal in size. Right Atrium: Right atrial size was not assessed. Pericardium: There is no evidence of pericardial effusion. Mitral Valve: The mitral valve is grossly normal. No evidence of mitral valve regurgitation. Tricuspid Valve: The tricuspid valve is grossly normal. Tricuspid valve regurgitation is mild. Aortic Valve: The aortic valve is grossly normal. Aortic valve regurgitation is not visualized. Pulmonic Valve: The pulmonic valve was normal in structure. Pulmonic valve regurgitation is not visualized. Aorta: The aortic root and ascending aorta are structurally normal, with no evidence of dilitation. IAS/Shunts: The interatrial septum was not assessed.  LEFT VENTRICLE PLAX 2D LVIDd:         4.73 cm LVIDs:         2.98 cm LV PW:  1.19 cm LV IVS:        1.34 cm LVOT diam:     2.00 cm LV SV:         79 LV SV Index:   37 LVOT Area:     3.14 cm  LEFT ATRIUM           Index LA diam:      3.80 cm 1.77 cm/m LA Vol (A2C):  28.5 ml 13.31 ml/m  AORTIC VALVE LVOT Vmax:   96.00 cm/s LVOT Vmean:  70.100 cm/s LVOT VTI:    0.251 m  AORTA Ao Root diam: 3.20 cm MITRAL VALVE MV Area (PHT): 3.42 cm    SHUNTS MV Decel Time: 222 msec    Systemic VTI:  0.25 m MV E velocity: 94.70 cm/s  Systemic Diam: 2.00 cm Mertie Moores MD Electronically signed by Mertie Moores MD Signature Date/Time: 10/30/2019/3:52:11 PM    Final         Scheduled Meds: . apixaban  5 mg Oral BID  . B-complex with vitamin C  1 tablet Oral Daily  . celecoxib  200 mg Oral Q12H  . Chlorhexidine Gluconate Cloth  6 each Topical Daily  . cholecalciferol  2,000 Units Oral Daily  . escitalopram  10 mg Oral Daily  . feeding supplement (ENSURE ENLIVE)  237 mL Oral TID BM  . insulin aspart  0-15 Units Subcutaneous TID WC  . insulin aspart  0-5 Units Subcutaneous QHS  . irbesartan  150 mg Oral Daily  . metoprolol tartrate  50 mg Oral BID  . multivitamin with minerals  1 tablet Oral Daily  . nystatin  5 mL Oral QID  . pioglitazone  30 mg Oral Daily  . senna  1 tablet Oral BID  . sodium chloride flush  3 mL Intravenous Q12H   Continuous Infusions: . sodium chloride       LOS: 7 days    Time spent: 35 minutes.    Elmarie Shiley, MD [facility]   If 7PM-7AM, please contact night-coverage [email or web address]  11/01/2019, 2:12 PM

## 2019-11-01 NOTE — TOC Progression Note (Signed)
Transition of Care [facility]) - Progression Note    Patient Details  Name: Breanna Shields MRN: [id number] Date of Birth: [birth year]  Transition of [facility]) CM/SW Contact  Elliot Gurney [place], [place] Phone Number: [phone] 11/01/2019, 9:41 AM  Clinical Narrative:    Phone call from [facility], spoke with [facility] [phone], cell [phone]) who provided authorization for PTAR-Authorization # [id number] as well as SNF-Authorization # [id number]. Patient authorized for SNF for 7 days. Informed HTA that patient has chosen [facility].    [address], LCSW Transition of Care [phone]   Expected Discharge Plan: Skilled Nursing Facility Barriers to Discharge: Ship broker, Continued Medical Work up  Expected Discharge Plan and Services Expected Discharge Plan: [place] Choice: [place] arrangements for the past 2 months: Single Family Home                                       Social Determinants of Health (SDOH) Interventions    Readmission Risk Interventions No flowsheet data found.

## 2019-11-01 NOTE — Progress Notes (Signed)
Occupational Therapy Treatment Patient Details Name: Breanna Shields MRN: [id number] DOB: [birth year] Today's Date: 11/01/2019    History of present illness 79 y.o. female admitted on 10/24/19 after falling with T9 vertebral body fx.  Pt underwent T7-11 pedical screw placement and ORIF of T9 on 10/26/19.  Other medical issues being worked on this admission are A-fib with RVR, delirium, hypoxia requiring supplemental O2 (was not on O2 at baseline), and UTI.  Pt with significant PMH of HTN, DM, bil TKA, obesity.     OT comments  Upon entering the room, pt supine in bed. Pt initially refusing therapy and says she will "try it some other time". OT provided encouragement and oriented pt to current situation. Pt is unaware that she has back precautions and OT educates pt on these as well. Pt demonstrates log roll L <> R with min A and max cuing from therapist for technique and safety. OT washing pt's back as she reports increase itching. Pt also washing her face and hands with set up A this session. Pt refused EOB and functional transfers with OT providing encouragement but pt continues to refuse. OT repositioned pt in bed for comfort and pt closing eyes and states, " I just want to sleep". OT changing discharge recommendation from CIR to SNF secondary to pt being unable to engage in 3 hours of intensive rehab intervention and based on pt's decreased motivation to participate. Pt will continue to benefit from acute OT intervention to address deficits.   Follow Up Recommendations  SNF    Equipment Recommendations  Other (comment) (defer to next venue of care)       Precautions / Restrictions Precautions Precautions: Back Precaution Comments: pt unable to state due to confusion; cues and guidance to maintain during session Required Braces or Orthoses: Spinal Brace Spinal Brace: Thoracolumbosacral orthotic;Applied in sitting position Restrictions Weight Bearing Restrictions: No       Mobility Bed  Mobility Overal bed mobility: Needs Assistance Bed Mobility: Rolling Rolling: Min assist         General bed mobility comments: refused sitting EOB and OOB  Transfers        General transfer comment: Pt refusal        ADL either performed or assessed with clinical judgement   ADL Overall ADL's : Needs assistance/impaired     Grooming: Wash/dry hands;Wash/dry face;Bed level;Set up            Vision Patient Visual Report: No change from baseline            Cognition Arousal/Alertness: Lethargic Behavior During Therapy: Anxious;Restless Overall Cognitive Status: Impaired/Different from baseline Area of Impairment: Orientation;Attention;Memory;Following commands;Safety/judgement;Awareness;Problem solving      Orientation Level: Disoriented to;Place;Time;Situation Current Attention Level: Sustained Memory: Decreased short-term memory Following Commands: Follows one step commands inconsistently;Follows one step commands with increased time Safety/Judgement: Decreased awareness of safety;Decreased awareness of deficits Awareness: Intellectual Problem Solving: Slow processing;Difficulty sequencing;Requires verbal cues;Requires tactile cues General Comments: Increased time to process and hand over hand to initiate movement. Max cuing for back precautions.                   Pertinent Vitals/ Pain       Pain Assessment: 0-10 Pain Score: 8  Pain Location: back Pain Descriptors / Indicators: Discomfort;Guarding Pain Intervention(s): Limited activity within patient's tolerance;Monitored during session;Repositioned         Frequency  Min 2X/week        Progress Toward Goals  OT Goals(current  goals can now be found in the care plan section)  Progress towards OT goals: Progressing toward goals  Acute Rehab OT Goals Patient Stated Goal: "to rest" OT Goal Formulation: With patient Time For Goal Achievement: 11/10/19 Potential to Achieve Goals: [place] Discharge plan remains appropriate       AM-PAC OT "6 Clicks" Daily Activity     Outcome Measure   Help from another person eating meals?: A Little Help from another person taking care of personal grooming?: A Little Help from another person toileting, which includes using toliet, bedpan, or urinal?: A Lot Help from another person bathing (including washing, rinsing, drying)?: A Lot   Help from another person to put on and taking off regular lower body clothing?: A Lot 6 Click Score: 12    End of Session Equipment Utilized During Treatment: Back brace  OT Visit Diagnosis: Unsteadiness on feet (R26.81);Muscle weakness (generalized) (M62.81);BPPV;Pain BPPV - Right/Left: Right   Activity Tolerance Patient limited by pain   Patient Left in bed;with call bell/phone within reach;with bed alarm set   Nurse Communication          Time: 2633-3545 OT Time Calculation (min): 17 min  Charges: OT General Charges $OT Visit: 1 Visit OT Treatments $Self Care/Home Management : 8-22 mins  Darleen Crocker, MS, OTR/L , CBIS 11/01/19, 11:43 AM

## 2019-11-01 NOTE — Progress Notes (Signed)
Patient refused ambulation but did get up and down several times during the night to use the [facility]

## 2019-11-01 NOTE — Progress Notes (Signed)
Notification per tele that pt had an 18 beat run of SVT; on-call notified

## 2019-11-02 LAB — GLUCOSE, CAPILLARY
Glucose-Capillary: 151 mg/dL — ABNORMAL HIGH (ref 70–99)
Glucose-Capillary: 122 mg/dL — ABNORMAL HIGH (ref 70–99)
Glucose-Capillary: 140 mg/dL — ABNORMAL HIGH (ref 70–99)
Glucose-Capillary: 158 mg/dL — ABNORMAL HIGH (ref 70–99)

## 2019-11-02 LAB — BASIC METABOLIC PANEL
Anion gap: 13 (ref 5–15)
BUN: 12 mg/dL (ref 8–23)
CO2: 24 mmol/L (ref 22–32)
Calcium: 9.1 mg/dL (ref 8.9–10.3)
Chloride: 102 mmol/L (ref 98–111)
Creatinine, Ser: 0.72 mg/dL (ref 0.44–1.00)
GFR calc Af Amer: >60 mL/min (ref >60)
GFR calc non Af Amer: >60 mL/min (ref >60)
Glucose, Bld: 151 mg/dL — ABNORMAL HIGH (ref 70–99)
Potassium: 4.3 mmol/L (ref 3.5–5.1)
Sodium: 139 mmol/L (ref 135–145)

## 2019-11-02 LAB — CBC
HCT: 32.1 % — ABNORMAL LOW (ref 36.0–46.0)
Hemoglobin: 10.3 g/dL — ABNORMAL LOW (ref 12.0–15.0)
MCH: 30.4 pg (ref 26.0–34.0)
MCHC: 32.1 g/dL (ref 30.0–36.0)
MCV: 94.7 fL (ref 80.0–100.0)
Platelets: 328 10*3/uL (ref 150–400)
RBC: 3.39 MIL/uL — ABNORMAL LOW (ref 3.87–5.11)
RDW: 13.6 % (ref 11.5–15.5)
WBC: 10.6 10*3/uL — ABNORMAL HIGH (ref 4.0–10.5)
nRBC: 0 % (ref 0.0–0.2)

## 2019-11-02 MED ORDER — AMLODIPINE BESYLATE 5 MG PO TABS
5.0000 mg | ORAL_TABLET | Freq: Every day | ORAL | Status: DC
  Administered 2019-11-02: 5 mg via ORAL
  Filled 2019-11-02: qty 1

## 2019-11-02 MED ORDER — HALOPERIDOL LACTATE 5 MG/ML IJ SOLN
3.0000 mg | Freq: Four times a day (QID) | INTRAMUSCULAR | Status: DC | PRN
  Administered 2019-11-02: 3 mg via INTRAVENOUS
  Filled 2019-11-02: qty 1

## 2019-11-02 NOTE — Progress Notes (Signed)
Subjective: NAEs o/n.  Back pain well-controlled.  Objective: Vital signs in last 24 hours: Temp:  [98 F (36.7 C)-98.7 F (37.1 C)] 98 F (36.7 C) (09/26 0745) Pulse Rate:  [60-83] 67 (09/26 0745) Resp:  [18-20] 20 (09/26 0745) BP: (149-179)/(59-78) 163/64 (09/26 0745) SpO2:  [93 %-99 %] 96 % (09/26 0745) Weight:  [361.2 kg] 115.2 kg (09/26 0358)  Intake/Output from previous day: 09/25 0701 - 09/26 0700 In: 761 [P.O.:701; IV Piggyback:60] Out: 600 [Urine:600] Intake/Output this shift: No intake/output data recorded.  Incisions c/d 5/5/ strength in LEs  Lab Results: Recent Labs    11/01/19 0616 11/02/19 0439  WBC 9.4 10.6*  HGB 10.7* 10.3*  HCT 33.7* 32.1*  PLT 304 328   BMET Recent Labs    11/01/19 0616 11/02/19 0439  NA 141 139  K 4.3 4.3  CL 105 102  CO2 26 24  GLUCOSE 148* 151*  BUN 16 12  CREATININE 0.78 0.72  CALCIUM 9.2 9.1    Studies/Results: No results found.  Assessment/Plan: T9 fracture s/p T7-11 perc instrumentation - likely SNF Monday   Breanna Shields 11/02/2019, 11:49 AM

## 2019-11-02 NOTE — Progress Notes (Signed)
PROGRESS NOTE    Breanna Shields  RJJ:[id number] DOB: 1940/08/09 DOA: 10/24/2019 PCP: Breanna Skeens, MD   Brief Narrative: 79 year old with past medical history significant for hypertension, hyperlipidemia, diabetes type 2, morbid obesity with BMI of 41 presents to the emergency department for evaluation of back pain after a fall.  Evaluation in the ED revealed T9 vertebral body fracture.  Neurosurgery was consulted.  Patient was hospitalized for further management. Overnight of  9/23 patient developed A. fib with RVR.    Assessment & Plan:   Principal Problem:   T9 vertebral fracture (HCC) Active Problems:   Mixed dyslipidemia   Hypertension, essential   DM (diabetes mellitus) ([place])   Depression   Leukocytosis   Closed fracture of thoracic spine without spinal cord lesion (HCC)   Atrial fibrillation with RVR (HCC)  1-A. fib with RVR: -Patient was given RVR. She was a started on Cardizem same infusion. Subsequently wean off cardizem  She was already on metoprolol. Her metoprolol dose was increased and subsequently still TSN infusion was discontinued TSH was normal. Echocardiogram: Normal ejection fraction no wall motion abnormality. Evaluation was discussed with neurosurgery okay to start heparin and if needed Eliquis She was transitioned to Eliquis 9/24 Heart rate better controlled.  2-T9 vertebral compression fracture Secondary to a fall. Neurosurgery was consulted. Patient underwent surgery on 9/19; thoracic 7 thoracic 11 percutaneous pedicle screw placement, ORIF T9 fracture with segmental pedicle screw fixation T7-T11. Continue with pain control and bowel regimen. Patient will require placement in skilled nursing facility.  Delirium: -Secondary to hospital stay, surgery and anesthesia as well. -Mentation fluctuates.  -Will proceed with MRI today.   Hypoxia; X-ray show atelectasis. Desaturation adamantly while she is sleeping, suggesting patient may have  undiagnosed sleep apnea. She will need sleep study, as an outpatient Continue with oxygen while sleeping.   Essential hypertension: Continue on ARB, continue metoprolol BP high side, will add norvasc.   Hyperlipidemia: Continue with statins  History of depression and anxiety: Continue with Lexapro  Normocytic anemia/leukocytosis: No evidence of overt bleeding. Hemoglobin stable  Diabetes  mellitus type II: Continue with a sliding scale insulin. Globin A1c 7.9.  UTI with E. coli and Klebsiella/urinary retention: UA showed moderate leukocytes and positive nitrite.  Blood cell 11-20. Growth E. coli and Klebsiella. Received ceftriaxone for 7 days. Voiding trial 9/24  Severe obesity: BMI 45    Estimated body mass index is 44.99 kg/m as calculated from the following:   Height as of this encounter: 5\' 3"  (1.6 m).   Weight as of this encounter: 115.2 kg.   DVT prophylaxis: Eliquis Code Status: Full code Family Communication: Daughter updated 9/26 Disposition Plan:  Status is: Inpatient  Remains inpatient appropriate because:Ongoing active pain requiring inpatient pain management   Dispo:  Patient From: Home  Planned Disposition: [facility]  Expected discharge date: 11/01/19  Medically stable for discharge: No         Consultants:   Neurosurgery  Procedures:   thoracic 7 thoracic 11 percutaneous pedicle screw placement, ORIF T9 fracture with segmental pedicle screw fixation T7-T11.    Antimicrobials:  Ceftriaxone  Subjective: She is sleepy, wake up answer questions , she is more confuse today. Thought she was at parking lot.   Objective: Vitals:   11/02/19 0001 11/02/19 0358 11/02/19 0433 11/02/19 0745  BP: (!) 164/66  (!) 179/78 (!) 163/64  Pulse: 67  68 67  Resp: 18  18 20   Temp: 98.3 F (36.8 C)  98.7  F (37.1 C) 98 F (36.7 C)  TempSrc: Axillary  Oral Oral  SpO2: 98%  98% 96%  Weight:  115.2 kg    Height:         Intake/Output Summary (Last 24 hours) at 11/02/2019 1051 Last data filed at 11/02/2019 8563 Gross per 24 hour  Intake 621 ml  Output 600 ml  Net 21 ml   Filed Weights   10/31/19 0500 11/01/19 0444 11/02/19 0358  Weight: 114.9 kg 115 kg 115.2 kg    Examination:  General exam: NAD Respiratory system: CTA Cardiovascular system: S 1, S 2 RRR Gastrointestinal system: Bs present, soft, nt Central nervous system: alert, confuse Extremities:  5/5 strength.     Data Reviewed: I have personally reviewed following labs and imaging studies  CBC: Recent Labs  Lab 10/29/19 0500 10/30/19 0416 10/31/19 0907 11/01/19 0616 11/02/19 0439  WBC 13.9* 11.0* 11.9* 9.4 10.6*  HGB 9.9* 10.1* 10.5* 10.7* 10.3*  HCT 31.8* 32.2* 33.0* 33.7* 32.1*  MCV 96.1 96.7 95.4 96.0 94.7  PLT 236 261 321 304 149   Basic Metabolic Panel: Recent Labs  Lab 10/29/19 0500 10/30/19 0416 10/31/19 0907 11/01/19 0616 11/02/19 0439  NA 140 139 138 141 139  K 4.0 4.1 3.6 4.3 4.3  CL 106 106 102 105 102  CO2 23 22 26 26 24   GLUCOSE 231* 169* 92 148* 151*  BUN 16 18 19 16 12   CREATININE 0.79 0.84 0.76 0.78 0.72  CALCIUM 9.2 9.1 9.4 9.2 9.1  MG  --  2.0  --  2.0  --    GFR: Estimated Creatinine Clearance: 70.9 mL/min (by C-G formula based on SCr of 0.72 mg/dL). Liver Function Tests: Recent Labs  Lab 10/27/19 1046 10/28/19 1147 10/29/19 0500 10/30/19 0416 10/31/19 0907  AST 25 27 29 28 29   ALT 24 24 28  32 35  ALKPHOS 62 65 69 68 68  BILITOT 0.7 0.9 1.1 0.9 0.2*  PROT 6.1* 5.9* 6.4* 6.0* 6.4*  ALBUMIN 2.9* 2.7* 2.7* 2.5* 2.8*   No results for input(s): LIPASE, AMYLASE in the last 168 hours. No results for input(s): AMMONIA in the last 168 hours. Coagulation Profile: No results for input(s): INR, PROTIME in the last 168 hours. Cardiac Enzymes: No results for input(s): CKTOTAL, CKMB, CKMBINDEX, TROPONINI in the last 168 hours. BNP (last 3 results) No results for input(s): PROBNP in the  last 8760 hours. HbA1C: No results for input(s): HGBA1C in the last 72 hours. CBG: Recent Labs  Lab 11/01/19 0605 11/01/19 1206 11/01/19 1534 11/01/19 1747 11/02/19 0557  GLUCAP 141* 155* 74 115* 151*   Lipid Profile: No results for input(s): CHOL, HDL, LDLCALC, TRIG, CHOLHDL, LDLDIRECT in the last 72 hours. Thyroid Function Tests: No results for input(s): TSH, T4TOTAL, FREET4, T3FREE, THYROIDAB in the last 72 hours. Anemia Panel: No results for input(s): VITAMINB12, FOLATE, FERRITIN, TIBC, IRON, RETICCTPCT in the last 72 hours. Sepsis Labs: No results for input(s): PROCALCITON, LATICACIDVEN in the last 168 hours.  Recent Results (from the past 240 hour(s))  SARS Coronavirus 2 by RT PCR (hospital order, performed in [facility] hospital lab) Nasopharyngeal Nasopharyngeal Swab     Status: None   Collection Time: 10/25/19  7:38 AM   Specimen: Nasopharyngeal Swab  Result Value Ref Range Status   SARS Coronavirus 2 NEGATIVE NEGATIVE Final    Comment: (NOTE) SARS-CoV-2 target nucleic acids are NOT DETECTED.  The SARS-CoV-2 RNA is generally detectable in upper and lower respiratory specimens during the  acute phase of infection. The lowest concentration of SARS-CoV-2 viral copies this assay can detect is 250 copies / mL. A negative result does not preclude SARS-CoV-2 infection and should not be used as the sole basis for treatment or other patient management decisions.  A negative result may occur with improper specimen collection / handling, submission of specimen other than nasopharyngeal swab, presence of viral mutation(s) within the areas targeted by this assay, and inadequate number of viral copies (<250 copies / mL). A negative result must be combined with clinical observations, patient history, and epidemiological information.  Fact Sheet for Patients:   StrictlyIdeas.no  Fact Sheet for Healthcare  Providers: BankingDealers.co.za  This test is not yet approved or  cleared by the [place] FDA and has been authorized for detection and/or diagnosis of SARS-CoV-2 by FDA under an Emergency Use Authorization (EUA).  This EUA will remain in effect (meaning this test can be used) for the duration of the COVID-19 declaration under Section 564(b)(1) of the Act, 21 U.S.C. section 360bbb-3(b)(1), unless the authorization is terminated or revoked sooner.  Performed at [facility], [place] [address] [place], [place] [address]   Surgical pcr screen     Status: None   Collection Time: 10/25/19 10:01 PM   Specimen: Nasal Mucosa; Nasal Swab  Result Value Ref Range Status   MRSA, PCR NEGATIVE NEGATIVE Final   Staphylococcus aureus NEGATIVE NEGATIVE Final    Comment: (NOTE) The Xpert SA Assay (FDA approved for NASAL specimens in patients 58 years of age and older), is one component of a comprehensive surveillance program. It is not intended to diagnose infection nor to guide or monitor treatment. Performed at [facility], [place] [address], [place], [place] [address]   Culture, Urine     Status: Abnormal   Collection Time: 10/26/19 10:09 AM   Specimen: Urine, Random  Result Value Ref Range Status   Specimen Description URINE, RANDOM  Final   Special Requests   Final    A URINE Performed at [facility], [place] [address], [place], [place] [address]    Culture (A)  Final    >=100,000 COLONIES/mL KLEBSIELLA PNEUMONIAE >=100,000 COLONIES/mL ESCHERICHIA COLI    Report Status 10/28/2019 FINAL  Final   Organism ID, Bacteria KLEBSIELLA PNEUMONIAE (A)  Final   Organism ID, Bacteria ESCHERICHIA COLI (A)  Final      Susceptibility   Escherichia coli - MIC*    AMPICILLIN <=2 SENSITIVE Sensitive     CEFAZOLIN <=4 SENSITIVE Sensitive     CEFTRIAXONE <=0.25 SENSITIVE Sensitive     CIPROFLOXACIN <=0.25 SENSITIVE Sensitive     GENTAMICIN <=1 SENSITIVE  Sensitive     IMIPENEM <=0.25 SENSITIVE Sensitive     NITROFURANTOIN <=16 SENSITIVE Sensitive     TRIMETH/SULFA <=20 SENSITIVE Sensitive     AMPICILLIN/SULBACTAM <=2 SENSITIVE Sensitive     PIP/TAZO <=4 SENSITIVE Sensitive     * >=100,000 COLONIES/mL ESCHERICHIA COLI   Klebsiella pneumoniae - MIC*    AMPICILLIN >=32 RESISTANT Resistant     CEFAZOLIN <=4 SENSITIVE Sensitive     CEFTRIAXONE <=0.25 SENSITIVE Sensitive     CIPROFLOXACIN <=0.25 SENSITIVE Sensitive     GENTAMICIN <=1 SENSITIVE Sensitive     IMIPENEM <=0.25 SENSITIVE Sensitive     NITROFURANTOIN <=16 SENSITIVE Sensitive     TRIMETH/SULFA <=20 SENSITIVE Sensitive     AMPICILLIN/SULBACTAM 8 SENSITIVE Sensitive     PIP/TAZO <=4 SENSITIVE Sensitive     * >=100,000  COLONIES/mL KLEBSIELLA PNEUMONIAE         Radiology Studies: No results found.      Scheduled Meds: . amLODipine  5 mg Oral Daily  . apixaban  5 mg Oral BID  . B-complex with vitamin C  1 tablet Oral Daily  . celecoxib  200 mg Oral Q12H  . Chlorhexidine Gluconate Cloth  6 each Topical Daily  . cholecalciferol  2,000 Units Oral Daily  . escitalopram  10 mg Oral Daily  . feeding supplement (ENSURE ENLIVE)  237 mL Oral TID BM  . insulin aspart  0-15 Units Subcutaneous TID WC  . insulin aspart  0-5 Units Subcutaneous QHS  . irbesartan  150 mg Oral Daily  . metoprolol tartrate  50 mg Oral BID  . multivitamin with minerals  1 tablet Oral Daily  . nystatin  5 mL Oral QID  . pioglitazone  30 mg Oral Daily  . senna  1 tablet Oral BID  . sodium chloride flush  3 mL Intravenous Q12H   Continuous Infusions: . sodium chloride       LOS: 8 days    Time spent: 35 minutes.    Elmarie Shiley, MD [facility]   If 7PM-7AM, please contact night-coverage [email or web address]  11/02/2019, 10:51 AM

## 2019-11-03 ENCOUNTER — Inpatient Hospital Stay (HOSPITAL_COMMUNITY): Payer: HMO

## 2019-11-03 LAB — GLUCOSE, CAPILLARY
Glucose-Capillary: 130 mg/dL — ABNORMAL HIGH (ref 70–99)
Glucose-Capillary: 210 mg/dL — ABNORMAL HIGH (ref 70–99)
Glucose-Capillary: 93 mg/dL (ref 70–99)
Glucose-Capillary: 144 mg/dL — ABNORMAL HIGH (ref 70–99)

## 2019-11-03 LAB — BASIC METABOLIC PANEL
Anion gap: 7 (ref 5–15)
BUN: 11 mg/dL (ref 8–23)
CO2: 28 mmol/L (ref 22–32)
Calcium: 9.1 mg/dL (ref 8.9–10.3)
Chloride: 101 mmol/L (ref 98–111)
Creatinine, Ser: 0.64 mg/dL (ref 0.44–1.00)
GFR calc Af Amer: >60 mL/min (ref >60)
GFR calc non Af Amer: >60 mL/min (ref >60)
Glucose, Bld: 219 mg/dL — ABNORMAL HIGH (ref 70–99)
Potassium: 4.6 mmol/L (ref 3.5–5.1)
Sodium: 136 mmol/L (ref 135–145)

## 2019-11-03 MED ORDER — BISACODYL 5 MG PO TBEC
5.0000 mg | DELAYED_RELEASE_TABLET | Freq: Once | ORAL | Status: AC
  Administered 2019-11-03: 5 mg via ORAL
  Filled 2019-11-03: qty 1

## 2019-11-03 MED ORDER — AMLODIPINE BESYLATE 10 MG PO TABS
10.0000 mg | ORAL_TABLET | Freq: Every day | ORAL | Status: DC
  Administered 2019-11-03: 10 mg via ORAL
  Filled 2019-11-03: qty 1

## 2019-11-03 MED ORDER — FLEET ENEMA 7-19 GM/118ML RE ENEM: 1.0000 | ENEMA | Freq: Every day | RECTAL | Status: DC | PRN

## 2019-11-03 MED ORDER — PROSOURCE PLUS PO LIQD
30.0000 mL | Freq: Two times a day (BID) | ORAL | Status: DC
  Administered 2019-11-03 – 2019-11-04 (×2): 30 mL via ORAL
  Filled 2019-11-03 (×3): qty 30

## 2019-11-03 NOTE — Progress Notes (Signed)
[place] for apixaban Indication: atrial fibrillation  Allergies  Allergen Reactions  . Chantix [Varenicline Tartrate] Nausea Only and Other (See Comments)    Dizziness  . Oxytrol [Oxybutynin Base] Other (See Comments)    Dry mouth   . Varenicline Nausea Only    Patient Measurements: Height: 5\' 3"  (160 cm) Weight: 112.1 kg (247 lb 2.2 oz) IBW/kg (Calculated) : 52.4 Heparin Dosing Weight: 80 kg  Vital Signs: Temp: 97.8 F (36.6 C) (09/27 1130) Temp Source: Oral (09/27 0753) BP: 157/58 (09/27 1130) Pulse Rate: 53 (09/27 1130)  Labs: Recent Labs    11/01/19 0616 11/02/19 0439 11/03/19 0247  HGB 10.7* 10.3*  --   HCT 33.7* 32.1*  --   PLT 304 328  --   CREATININE 0.78 0.72 0.64    Estimated Creatinine Clearance: 69.8 mL/min (by C-G formula based on SCr of 0.64 mg/dL).   Medical History: Past Medical History:  Diagnosis Date  . Allergy   . Diabetes mellitus    type ii  . Hyperlipidemia   . Hypertension     Medications:  Medications Prior to Admission  Medication Sig Dispense Refill Last Dose  . B Complex-C (B-COMPLEX WITH VITAMIN C) tablet Take 1 tablet by mouth daily.   Past Week at Unknown time  . Cholecalciferol (VITAMIN D3) 50 MCG (2000 UT) TABS Take 2,000 Units by mouth daily.   Past Week at Unknown time  . escitalopram (LEXAPRO) 10 MG tablet Take 10 mg by mouth daily.   Past Week at Unknown time  . meclizine (ANTIVERT) 25 MG tablet Take 25 mg by mouth every 6 (six) hours as needed for dizziness.   unk  . Multiple Vitamins-Minerals (MULTIVITAMIN WITH MINERALS) tablet Take 1 tablet by mouth daily.     Past Week at Unknown time  . olmesartan (BENICAR) 40 MG tablet Take 40 mg by mouth daily.   10/23/2019  . pioglitazone (ACTOS) 30 MG tablet Take 30 mg by mouth Daily.    10/23/2019  . HYDROcodone-acetaminophen (NORCO/VICODIN) 5-325 MG tablet Take 1-2 tablets by mouth every 4 (four) hours as needed for moderate  pain (pain score 4-6). (Patient not taking: Reported on 10/25/2019) 40 tablet 0 Not Taking at Unknown time  . ONE TOUCH ULTRA TEST test strip      . TRULICITY 7.19 LV/7.4XV SOPN Inject 0.75 mg into the skin every Tuesday at 6 PM.       Assessment: 79 yo female with new onset afib with RVR s/p heparin drip, now on apixaban starting 9/24. Patient with percutaneous neurosurgical procedure on 9/19. No anticoagulation at home PTA. No bleeding noted.  Goal of Therapy:  Anticoagulation Monitor platelets by anticoagulation protocol: Yes   Plan:  Continue apixaban 5 mg PO twice daily Monitor for signs and symptoms of bleeding Pharmacy will sign off   Thank you for allowing [place] to participate in this patients care.   Jens Som, PharmD Please see amion for complete clinical pharmacist phone list. 11/03/2019 11:34 AM

## 2019-11-03 NOTE — Progress Notes (Signed)
PROGRESS NOTE    EDESSA JAKUBOWICZ  VFI:[id number] DOB: 1940/11/21 DOA: 10/24/2019 PCP: Lorne Skeens, MD   Brief Narrative: 79 year old with past medical history significant for hypertension, hyperlipidemia, diabetes type 2, morbid obesity with BMI of 41 presents to the emergency department for evaluation of back pain after a fall.  Evaluation in the ED revealed T9 vertebral body fracture.  Neurosurgery was consulted.  Patient was hospitalized for further management. Overnight of  9/23 patient developed A. fib with RVR.    Assessment & Plan:   Principal Problem:   T9 vertebral fracture (HCC) Active Problems:   Mixed dyslipidemia   Hypertension, essential   DM (diabetes mellitus) ([place])   Depression   Leukocytosis   Closed fracture of thoracic spine without spinal cord lesion (HCC)   Atrial fibrillation with RVR (HCC)  1-A. fib with RVR: -Patient was given RVR. She was a started on Cardizem same infusion. Subsequently wean off cardizem  She was already on metoprolol. Her metoprolol dose was increased and subsequently still TSN infusion was discontinued TSH was normal. Echocardiogram: Normal ejection fraction no wall motion abnormality. Evaluation was discussed with neurosurgery okay to start heparin and if needed Eliquis She was transitioned to Eliquis 9/24 Heart rate better controlled.  2-T9 vertebral compression fracture Secondary to a fall. Neurosurgery was consulted. Patient underwent surgery on 9/19; thoracic 7 thoracic 11 percutaneous pedicle screw placement, ORIF T9 fracture with segmental pedicle screw fixation T7-T11. Continue with pain control and bowel regimen. Patient will require placement in skilled nursing facility.  Delirium: -Secondary to hospital stay, surgery and anesthesia as well. -Mentation fluctuates.  -Awaiting MRI to be done.  She was agitated last night, received haldol.  -sleepy on my evaluation. Per husband she was up in the morning eat  some breakfast.   Hypoxia; X-ray show atelectasis. Desaturation adamantly while she is sleeping, suggesting patient may have undiagnosed sleep apnea. She will need sleep study, as an outpatient Continue with oxygen while sleeping.   Essential hypertension: Continue on ARB, continue metoprolol Plan to increase norvasc to 10 mg.   Hyperlipidemia: Continue with statins  History of depression and anxiety: Continue with Lexapro  Normocytic anemia/leukocytosis: No evidence of overt bleeding. Hemoglobin stable  Diabetes  mellitus type II: Continue with a sliding scale insulin. Globin A1c 7.9.  UTI with E. coli and Klebsiella/urinary retention: UA showed moderate leukocytes and positive nitrite.  Blood cell 11-20. Growth E. coli and Klebsiella. Received ceftriaxone for 7 days. Voiding trial 9/24  Severe obesity: BMI 45    Estimated body mass index is 43.78 kg/m as calculated from the following:   Height as of this encounter: 5\' 3"  (1.6 m).   Weight as of this encounter: 112.1 kg.   DVT prophylaxis: Eliquis Code Status: Full code Family Communication: Daughter updated 9/26 will call daughter later with MRI results.  Disposition Plan:  Status is: Inpatient  Remains inpatient appropriate because:Ongoing active pain requiring inpatient pain management   Dispo:  Patient From: Home  Planned Disposition: [facility]  Expected discharge date: 11/04/19  Medically stable for discharge: No SNF 9/28         Consultants:   Neurosurgery  Procedures:   thoracic 7 thoracic 11 percutaneous pedicle screw placement, ORIF T9 fracture with segmental pedicle screw fixation T7-T11.    Antimicrobials:  Ceftriaxone  Subjective: She was agitated last night. Received haldol.  She was up this morning, sitting recliner, ate breakfast per husband.  On my evaluation patient was sleepy in  bed. She would answer few questions   Objective: Vitals:   11/03/19 0300  11/03/19 0500 11/03/19 0753 11/03/19 1130  BP: (!) 168/69  (!) 163/93 (!) 157/58  Pulse: 62  71 (!) 53  Resp: 20  20 20   Temp: 97.9 F (36.6 C)  97.9 F (36.6 C) 97.8 F (36.6 C)  TempSrc: Oral  Oral   SpO2: 99%  97% 99%  Weight:  112.1 kg    Height:        Intake/Output Summary (Last 24 hours) at 11/03/2019 1247 Last data filed at 11/03/2019 1036 Gross per 24 hour  Intake 240 ml  Output 200 ml  Net 40 ml   Filed Weights   11/01/19 0444 11/02/19 0358 11/03/19 0500  Weight: 115 kg 115.2 kg 112.1 kg    Examination:  General exam: Sleepy Respiratory system: CTA Cardiovascular system: S 1, S 2 RRR Gastrointestinal system: BS present, soft, nt Central nervous system: Sleepy, she was oriented to person and place.  Extremities:  5/5 strength.     Data Reviewed: I have personally reviewed following labs and imaging studies  CBC: Recent Labs  Lab 10/29/19 0500 10/30/19 0416 10/31/19 0907 11/01/19 0616 11/02/19 0439  WBC 13.9* 11.0* 11.9* 9.4 10.6*  HGB 9.9* 10.1* 10.5* 10.7* 10.3*  HCT 31.8* 32.2* 33.0* 33.7* 32.1*  MCV 96.1 96.7 95.4 96.0 94.7  PLT 236 261 321 304 509   Basic Metabolic Panel: Recent Labs  Lab 10/30/19 0416 10/31/19 0907 11/01/19 0616 11/02/19 0439 11/03/19 0247  NA 139 138 141 139 136  K 4.1 3.6 4.3 4.3 4.6  CL 106 102 105 102 101  CO2 22 26 26 24 28   GLUCOSE 169* 92 148* 151* 219*  BUN 18 19 16 12 11   CREATININE 0.84 0.76 0.78 0.72 0.64  CALCIUM 9.1 9.4 9.2 9.1 9.1  MG 2.0  --  2.0  --   --    GFR: Estimated Creatinine Clearance: 69.8 mL/min (by C-G formula based on SCr of 0.64 mg/dL). Liver Function Tests: Recent Labs  Lab 10/28/19 1147 10/29/19 0500 10/30/19 0416 10/31/19 0907  AST 27 29 28 29   ALT 24 28 32 35  ALKPHOS 65 69 68 68  BILITOT 0.9 1.1 0.9 0.2*  PROT 5.9* 6.4* 6.0* 6.4*  ALBUMIN 2.7* 2.7* 2.5* 2.8*   No results for input(s): LIPASE, AMYLASE in the last 168 hours. No results for input(s): AMMONIA in the last  168 hours. Coagulation Profile: No results for input(s): INR, PROTIME in the last 168 hours. Cardiac Enzymes: No results for input(s): CKTOTAL, CKMB, CKMBINDEX, TROPONINI in the last 168 hours. BNP (last 3 results) No results for input(s): PROBNP in the last 8760 hours. HbA1C: No results for input(s): HGBA1C in the last 72 hours. CBG: Recent Labs  Lab 11/02/19 1157 11/02/19 1648 11/02/19 2118 11/03/19 0618 11/03/19 1133  GLUCAP 122* 140* 158* 130* 210*   Lipid Profile: No results for input(s): CHOL, HDL, LDLCALC, TRIG, CHOLHDL, LDLDIRECT in the last 72 hours. Thyroid Function Tests: No results for input(s): TSH, T4TOTAL, FREET4, T3FREE, THYROIDAB in the last 72 hours. Anemia Panel: No results for input(s): VITAMINB12, FOLATE, FERRITIN, TIBC, IRON, RETICCTPCT in the last 72 hours. Sepsis Labs: No results for input(s): PROCALCITON, LATICACIDVEN in the last 168 hours.  Recent Results (from the past 240 hour(s))  SARS Coronavirus 2 by RT PCR (hospital order, performed in [facility] hospital lab) Nasopharyngeal Nasopharyngeal Swab     Status: None   Collection Time: 10/25/19  7:38 AM   Specimen: Nasopharyngeal Swab  Result Value Ref Range Status   SARS Coronavirus 2 NEGATIVE NEGATIVE Final    Comment: (NOTE) SARS-CoV-2 target nucleic acids are NOT DETECTED.  The SARS-CoV-2 RNA is generally detectable in upper and lower respiratory specimens during the acute phase of infection. The lowest concentration of SARS-CoV-2 viral copies this assay can detect is 250 copies / mL. A negative result does not preclude SARS-CoV-2 infection and should not be used as the sole basis for treatment or other patient management decisions.  A negative result may occur with improper specimen collection / handling, submission of specimen other than nasopharyngeal swab, presence of viral mutation(s) within the areas targeted by this assay, and inadequate number of viral copies (<250 copies / mL). A  negative result must be combined with clinical observations, patient history, and epidemiological information.  Fact Sheet for Patients:   StrictlyIdeas.no  Fact Sheet for Healthcare Providers: BankingDealers.co.za  This test is not yet approved or  cleared by the [place] FDA and has been authorized for detection and/or diagnosis of SARS-CoV-2 by FDA under an Emergency Use Authorization (EUA).  This EUA will remain in effect (meaning this test can be used) for the duration of the COVID-19 declaration under Section 564(b)(1) of the Act, 21 U.S.C. section 360bbb-3(b)(1), unless the authorization is terminated or revoked sooner.  Performed at [facility], [place] [address], [place], [place] [address]   Surgical pcr screen     Status: None   Collection Time: 10/25/19 10:01 PM   Specimen: Nasal Mucosa; Nasal Swab  Result Value Ref Range Status   MRSA, PCR NEGATIVE NEGATIVE Final   Staphylococcus aureus NEGATIVE NEGATIVE Final    Comment: (NOTE) The Xpert SA Assay (FDA approved for NASAL specimens in patients 39 years of age and older), is one component of a comprehensive surveillance program. It is not intended to diagnose infection nor to guide or monitor treatment. Performed at [facility], [place] [address], [place], [place] [address]   Culture, Urine     Status: Abnormal   Collection Time: 10/26/19 10:09 AM   Specimen: Urine, Random  Result Value Ref Range Status   Specimen Description URINE, RANDOM  Final   Special Requests   Final    A URINE Performed at [facility], [place] [address], [place], [place] [address]    Culture (A)  Final    >=100,000 COLONIES/mL KLEBSIELLA PNEUMONIAE >=100,000 COLONIES/mL ESCHERICHIA COLI    Report Status 10/28/2019 FINAL  Final   Organism ID, Bacteria KLEBSIELLA PNEUMONIAE (A)  Final   Organism ID, Bacteria ESCHERICHIA COLI (A)  Final      Susceptibility   Escherichia  coli - MIC*    AMPICILLIN <=2 SENSITIVE Sensitive     CEFAZOLIN <=4 SENSITIVE Sensitive     CEFTRIAXONE <=0.25 SENSITIVE Sensitive     CIPROFLOXACIN <=0.25 SENSITIVE Sensitive     GENTAMICIN <=1 SENSITIVE Sensitive     IMIPENEM <=0.25 SENSITIVE Sensitive     NITROFURANTOIN <=16 SENSITIVE Sensitive     TRIMETH/SULFA <=20 SENSITIVE Sensitive     AMPICILLIN/SULBACTAM <=2 SENSITIVE Sensitive     PIP/TAZO <=4 SENSITIVE Sensitive     * >=100,000 COLONIES/mL ESCHERICHIA COLI   Klebsiella pneumoniae - MIC*    AMPICILLIN >=32 RESISTANT Resistant     CEFAZOLIN <=4 SENSITIVE Sensitive     CEFTRIAXONE <=0.25 SENSITIVE Sensitive     CIPROFLOXACIN <=0.25 SENSITIVE Sensitive     GENTAMICIN <=1  SENSITIVE Sensitive     IMIPENEM <=0.25 SENSITIVE Sensitive     NITROFURANTOIN <=16 SENSITIVE Sensitive     TRIMETH/SULFA <=20 SENSITIVE Sensitive     AMPICILLIN/SULBACTAM 8 SENSITIVE Sensitive     PIP/TAZO <=4 SENSITIVE Sensitive     * >=100,000 COLONIES/mL KLEBSIELLA PNEUMONIAE         Radiology Studies: No results found.      Scheduled Meds: . (feeding supplement) PROSource Plus  30 mL Oral BID BM  . amLODipine  10 mg Oral Daily  . apixaban  5 mg Oral BID  . B-complex with vitamin C  1 tablet Oral Daily  . celecoxib  200 mg Oral Q12H  . Chlorhexidine Gluconate Cloth  6 each Topical Daily  . cholecalciferol  2,000 Units Oral Daily  . escitalopram  10 mg Oral Daily  . feeding supplement (ENSURE ENLIVE)  237 mL Oral TID BM  . insulin aspart  0-15 Units Subcutaneous TID WC  . insulin aspart  0-5 Units Subcutaneous QHS  . irbesartan  150 mg Oral Daily  . metoprolol tartrate  50 mg Oral BID  . multivitamin with minerals  1 tablet Oral Daily  . nystatin  5 mL Oral QID  . pioglitazone  30 mg Oral Daily  . senna  1 tablet Oral BID  . sodium chloride flush  3 mL Intravenous Q12H   Continuous Infusions: . sodium chloride       LOS: 9 days    Time spent: 35 minutes.    Elmarie Shiley, MD [facility]   If 7PM-7AM, please contact night-coverage [email or web address]  11/03/2019, 12:47 PM

## 2019-11-03 NOTE — Progress Notes (Signed)
Physical Therapy Treatment Patient Details Name: Breanna Shields MRN: [id number] DOB: Nov 24, 1940 Today's Date: 11/03/2019    History of Present Illness 79 y.o. female admitted on 10/24/19 after falling with T9 vertebral body fx.  Pt underwent T7-11 pedical screw placement and ORIF of T9 on 10/26/19.  Other medical issues being worked on this admission are A-fib with RVR, delirium, hypoxia requiring supplemental O2 (was not on O2 at baseline), and UTI.  Pt with significant PMH of HTN, DM, bil TKA, obesity.      PT Comments    Patient calling out to use the [facility] on arrival. Required constant cues to be patient and not get up until PT had BSC and assist present (NT in to assist). Patient had undone her back brace while seated in the chair and adjusted with education to not remove it prior to standing transfer. While seated on [facility], educated pt and husband on back precautions and worked on orientation with pt (improved from last week). RN in and stated pt to go down for MRI brain soon. Patient assisted back to bed.     Follow Up Recommendations  SNF     Equipment Recommendations  None recommended by PT    Recommendations for Other Services       Precautions / Restrictions Precautions Precautions: Back Precaution Booklet Issued:  (given previously) Precaution Comments: pt unable to state; while on BSC, educated on back precautions and pt could not recall even after <2 minutes; cues and guidance to maintain during session Required Braces or Orthoses: Spinal Brace Spinal Brace: Thoracolumbosacral orthotic;Applied in sitting position    Mobility  Bed Mobility Overal bed mobility: Needs Assistance Bed Mobility: Sit to Sidelying         Sit to sidelying: Min assist General bed mobility comments: step by step cues for sit to sidelying with assist to raise lead leg onto bed (while remaining in sidelying) pt then lifted 2nd leg as she rolled onto her back  Transfers Overall transfer level:  Needs assistance Equipment used: Rolling walker (2 wheeled) Transfers: Sit to/from [facility] Sit to Stand: Min assist Stand pivot transfers: Min assist;+2 safety/equipment       General transfer comment: pt anxious to use the bathroom and assisted recliner to [facility]; BSC to bed as pt to go down for MRI   Ambulation/Gait Ambulation/Gait assistance: Min assist Gait Distance (Feet): 2 Feet (x2 ) Assistive device: Rolling walker (2 wheeled) Gait Pattern/deviations: Step-to pattern;Shuffle;Trunk flexed     General Gait Details: Distance limited due to rush to use the bathroom and then only +1 assist as getting off the [facility] and not safe to walk with +1 due to her impulsivity   Stairs             Wheelchair Mobility    Modified Rankin (Stroke Patients Only)       Balance Overall balance assessment: Needs assistance Sitting-balance support: Feet supported Sitting balance-Leahy Scale: Fair Sitting balance - Comments: close supervision EOB, not challenged   Standing balance support: Bilateral upper extremity supported Standing balance-Leahy Scale: Poor Standing balance comment: Needs external support from therapist and RW.                            Cognition Arousal/Alertness: Awake/alert Behavior During Therapy: Anxious;Restless Overall Cognitive Status: Impaired/Different from baseline Area of Impairment: Attention;Memory;Following commands;Safety/judgement;Awareness;Problem solving  Orientation Level:  (oriented person, place, situation; time NT) Current Attention Level: Sustained Memory: Decreased short-term memory;Decreased recall of precautions Following Commands: Follows one step commands inconsistently Safety/Judgement: Decreased awareness of safety;Decreased awareness of deficits Awareness: Intellectual Problem Solving: Difficulty sequencing;Requires verbal cues;Requires tactile cues General Comments: anxious to  use the bathrroom and required step by step cues for safety;       Exercises      General Comments General comments (skin integrity, edema, etc.): Husband present throughout. Patient did not show nystagmus with side to sit to her left, however head position not maintained in rt rotation. She reported she felt "a little dizzy, but it's already gone."      Pertinent Vitals/Pain Pain Assessment: Faces Faces Pain Scale: Hurts little more Pain Location: back Pain Descriptors / Indicators: Discomfort;Guarding    Home Living                      Prior Function            PT Goals (current goals can now be found in the care plan section) Acute Rehab PT Goals Patient Stated Goal: to get better Time For Goal Achievement: 11/10/19 Potential to Achieve Goals: Good Progress towards PT goals: Progressing toward goals (slowly; requires less assist)    Frequency    Min 3X/week      PT Plan Current plan remains appropriate    Co-evaluation              AM-PAC PT "6 Clicks" Mobility   Outcome Measure  Help needed turning from your back to your side while in a flat bed without using bedrails?: A Little Help needed moving from lying on your back to sitting on the side of a flat bed without using bedrails?: A Lot Help needed moving to and from a bed to a chair (including a wheelchair)?: A Little Help needed standing up from a chair using your arms (e.g., wheelchair or bedside chair)?: A Little Help needed to walk in hospital room?: A Little Help needed climbing 3-5 steps with a railing? : Total 6 Click Score: 15    End of Session Equipment Utilized During Treatment: Back brace Activity Tolerance: Treatment limited secondary to medical complications (Comment) (anxious) Patient left: in bed;with family/visitor present;with call bell/phone within reach;with bed alarm set Nurse Communication: Mobility status PT Visit Diagnosis: Muscle weakness (generalized)  (M62.81);Difficulty in walking, not elsewhere classified (R26.2);BPPV;Pain BPPV - Right/Left : Right;Left (possible bil) Pain - Right/Left:  (midline/incisional) Pain - part of body:  (back)     Time: 1062-6948 PT Time Calculation (min) (ACUTE ONLY): 30 min  Charges:  $Therapeutic Activity: 8-22 mins $Self Care/Home Management: 8-22                      Arby Barrette, PT Pager [phone]    Rexanne Mano 11/03/2019, 11:14 AM

## 2019-11-03 NOTE — Progress Notes (Signed)
Patient ID: Breanna Shields, female   DOB: 11-26-1940, 79 y.o.   MRN: [id number] BP (!) 134/46 (BP Location: Left Arm)   Pulse (!) 58   Temp 98.5 F (36.9 C) (Oral)   Resp 20   Ht 5\' 3"  (1.6 m)   Wt 112.1 kg   SpO2 93%   BMI 43.78 kg/m  Wound is clean, and dry Ok for snf Will leave instructions on discharge

## 2019-11-03 NOTE — Progress Notes (Signed)
Initial Nutrition Assessment  DOCUMENTATION CODES:   Morbid obesity  INTERVENTION:  22ml Prosource Plus po BID, each supplement provides 100 kcal and 15 grams of protein  Continue Ensure Enlive po TID, each supplement provides 350 kcal and 20 grams of protein  Continue MVI daily    NUTRITION DIAGNOSIS:   Increased nutrient needs related to other (see comment) (T9 vertebral fracture) as evidenced by estimated needs.    GOAL:   Patient will meet greater than or equal to 90% of their needs    MONITOR:   PO intake, Supplement acceptance, Labs, Weight trends  REASON FOR ASSESSMENT:   Malnutrition Screening Tool    ASSESSMENT:   Pt with PMH significant for HTN, HLD, type 2 DM admitted with T9 vertebral fracture.   9/19 ORIF T9 fracture with segmental pedicle screw fixation T7-T11  9/23 overnight pt developed Afib with RVR  Pt unavailable at time of RD visit.  Pt's mentation noted to fluctuate.   Pt already receiving Ensure Enlive TID. Per RN, pt accepting supplements.   PO intake: 10-100% x last 8 recorded meals (33% average meal intake)  Labs: CBGs 130-210 Medications: B-complex with Vitamin C, Vitamin D3, Novolog, MVI, Senokot  Diet Order:   Diet Order            DIET SOFT Room service appropriate? Yes; Fluid consistency: Thin  Diet effective now                 EDUCATION NEEDS:   No education needs have been identified at this time  Skin:  Skin Assessment: Skin Integrity Issues: Skin Integrity Issues:: Incisions Incisions: back  Last BM:  9/26 type 4  Height:   Ht Readings from Last 1 Encounters:  10/24/19 5\' 3"  (1.6 m)    Weight:   Wt Readings from Last 1 Encounters:  11/03/19 112.1 kg    BMI:  Body mass index is 43.78 kg/m.  Estimated Nutritional Needs:   Kcal:  1800-2000  Protein:  115-130 grams  Fluid:  >/=1.8L/d    Larkin Ina, MS, RD, LDN RD pager number and weekend/on-call pager number located in [place].

## 2019-11-03 NOTE — Discharge Instructions (Signed)

## 2019-11-04 DIAGNOSIS — I959 Hypotension, unspecified: Secondary | ICD-10-CM | POA: Diagnosis not present

## 2019-11-04 DIAGNOSIS — R58 Hemorrhage, not elsewhere classified: Secondary | ICD-10-CM | POA: Diagnosis not present

## 2019-11-04 DIAGNOSIS — M2550 Pain in unspecified joint: Secondary | ICD-10-CM | POA: Diagnosis not present

## 2019-11-04 DIAGNOSIS — S22009D Unspecified fracture of unspecified thoracic vertebra, subsequent encounter for fracture with routine healing: Secondary | ICD-10-CM | POA: Diagnosis not present

## 2019-11-04 DIAGNOSIS — E119 Type 2 diabetes mellitus without complications: Secondary | ICD-10-CM | POA: Diagnosis not present

## 2019-11-04 DIAGNOSIS — S0990XA Unspecified injury of head, initial encounter: Secondary | ICD-10-CM | POA: Diagnosis present

## 2019-11-04 DIAGNOSIS — S0993XA Unspecified injury of face, initial encounter: Secondary | ICD-10-CM | POA: Diagnosis not present

## 2019-11-04 DIAGNOSIS — R519 Headache, unspecified: Secondary | ICD-10-CM | POA: Diagnosis not present

## 2019-11-04 DIAGNOSIS — D649 Anemia, unspecified: Secondary | ICD-10-CM | POA: Diagnosis not present

## 2019-11-04 DIAGNOSIS — G894 Chronic pain syndrome: Secondary | ICD-10-CM | POA: Diagnosis not present

## 2019-11-04 DIAGNOSIS — S22070D Wedge compression fracture of T9-T10 vertebra, subsequent encounter for fracture with routine healing: Secondary | ICD-10-CM | POA: Diagnosis not present

## 2019-11-04 DIAGNOSIS — M25551 Pain in right hip: Secondary | ICD-10-CM | POA: Diagnosis not present

## 2019-11-04 DIAGNOSIS — S22009A Unspecified fracture of unspecified thoracic vertebra, initial encounter for closed fracture: Secondary | ICD-10-CM | POA: Diagnosis not present

## 2019-11-04 DIAGNOSIS — S72001D Fracture of unspecified part of neck of right femur, subsequent encounter for closed fracture with routine healing: Secondary | ICD-10-CM | POA: Diagnosis not present

## 2019-11-04 DIAGNOSIS — I10 Essential (primary) hypertension: Secondary | ICD-10-CM | POA: Diagnosis not present

## 2019-11-04 DIAGNOSIS — I4891 Unspecified atrial fibrillation: Secondary | ICD-10-CM | POA: Diagnosis not present

## 2019-11-04 DIAGNOSIS — H6690 Otitis media, unspecified, unspecified ear: Secondary | ICD-10-CM | POA: Diagnosis not present

## 2019-11-04 DIAGNOSIS — S199XXA Unspecified injury of neck, initial encounter: Secondary | ICD-10-CM | POA: Diagnosis not present

## 2019-11-04 DIAGNOSIS — R339 Retention of urine, unspecified: Secondary | ICD-10-CM | POA: Diagnosis not present

## 2019-11-04 DIAGNOSIS — F418 Other specified anxiety disorders: Secondary | ICD-10-CM | POA: Diagnosis not present

## 2019-11-04 DIAGNOSIS — M549 Dorsalgia, unspecified: Secondary | ICD-10-CM | POA: Diagnosis not present

## 2019-11-04 DIAGNOSIS — S0101XA Laceration without foreign body of scalp, initial encounter: Secondary | ICD-10-CM | POA: Diagnosis not present

## 2019-11-04 DIAGNOSIS — M6281 Muscle weakness (generalized): Secondary | ICD-10-CM | POA: Diagnosis not present

## 2019-11-04 DIAGNOSIS — R2681 Unsteadiness on feet: Secondary | ICD-10-CM | POA: Diagnosis not present

## 2019-11-04 DIAGNOSIS — Z7901 Long term (current) use of anticoagulants: Secondary | ICD-10-CM | POA: Diagnosis not present

## 2019-11-04 DIAGNOSIS — W06XXXA Fall from bed, initial encounter: Secondary | ICD-10-CM | POA: Diagnosis not present

## 2019-11-04 DIAGNOSIS — R410 Disorientation, unspecified: Secondary | ICD-10-CM | POA: Diagnosis not present

## 2019-11-04 DIAGNOSIS — W19XXXA Unspecified fall, initial encounter: Secondary | ICD-10-CM | POA: Diagnosis not present

## 2019-11-04 DIAGNOSIS — Z043 Encounter for examination and observation following other accident: Secondary | ICD-10-CM | POA: Diagnosis not present

## 2019-11-04 DIAGNOSIS — M542 Cervicalgia: Secondary | ICD-10-CM | POA: Diagnosis not present

## 2019-11-04 DIAGNOSIS — S22079A Unspecified fracture of T9-T10 vertebra, initial encounter for closed fracture: Secondary | ICD-10-CM | POA: Diagnosis not present

## 2019-11-04 DIAGNOSIS — M546 Pain in thoracic spine: Secondary | ICD-10-CM | POA: Diagnosis not present

## 2019-11-04 DIAGNOSIS — Z7401 Bed confinement status: Secondary | ICD-10-CM | POA: Diagnosis not present

## 2019-11-04 DIAGNOSIS — N390 Urinary tract infection, site not specified: Secondary | ICD-10-CM | POA: Diagnosis not present

## 2019-11-04 DIAGNOSIS — R109 Unspecified abdominal pain: Secondary | ICD-10-CM | POA: Diagnosis not present

## 2019-11-04 DIAGNOSIS — R441 Visual hallucinations: Secondary | ICD-10-CM | POA: Diagnosis not present

## 2019-11-04 DIAGNOSIS — R52 Pain, unspecified: Secondary | ICD-10-CM | POA: Diagnosis not present

## 2019-11-04 DIAGNOSIS — M25561 Pain in right knee: Secondary | ICD-10-CM | POA: Diagnosis not present

## 2019-11-04 DIAGNOSIS — R42 Dizziness and giddiness: Secondary | ICD-10-CM | POA: Diagnosis not present

## 2019-11-04 DIAGNOSIS — R4182 Altered mental status, unspecified: Secondary | ICD-10-CM | POA: Diagnosis not present

## 2019-11-04 DIAGNOSIS — R601 Generalized edema: Secondary | ICD-10-CM | POA: Diagnosis not present

## 2019-11-04 DIAGNOSIS — Y92003 Bedroom of unspecified non-institutional (private) residence as the place of occurrence of the external cause: Secondary | ICD-10-CM | POA: Diagnosis not present

## 2019-11-04 DIAGNOSIS — R197 Diarrhea, unspecified: Secondary | ICD-10-CM | POA: Diagnosis not present

## 2019-11-04 DIAGNOSIS — M545 Low back pain, unspecified: Secondary | ICD-10-CM | POA: Diagnosis not present

## 2019-11-04 DIAGNOSIS — R600 Localized edema: Secondary | ICD-10-CM | POA: Diagnosis not present

## 2019-11-04 LAB — GLUCOSE, CAPILLARY
Glucose-Capillary: 151 mg/dL — ABNORMAL HIGH (ref 70–99)
Glucose-Capillary: 141 mg/dL — ABNORMAL HIGH (ref 70–99)

## 2019-11-04 LAB — RESPIRATORY PANEL BY RT PCR (FLU A&B, COVID)
Influenza A by PCR: NEGATIVE
Influenza B by PCR: NEGATIVE
SARS Coronavirus 2 by RT PCR: NEGATIVE

## 2019-11-04 MED ORDER — MECLIZINE HCL 12.5 MG PO TABS: 25.0000 mg | ORAL_TABLET | Freq: Two times a day (BID) | ORAL | Status: DC | PRN

## 2019-11-04 MED ORDER — NYSTATIN 100000 UNIT/ML MT SUSP: 5.0000 mL | Freq: Four times a day (QID) | OROMUCOSAL | 0 refills | Status: AC

## 2019-11-04 MED ORDER — AMLODIPINE BESYLATE 5 MG PO TABS
5.0000 mg | ORAL_TABLET | Freq: Every day | ORAL | Status: DC
  Administered 2019-11-04: 5 mg via ORAL
  Filled 2019-11-04: qty 1

## 2019-11-04 MED ORDER — SENNA 8.6 MG PO TABS: 1.0000 | ORAL_TABLET | Freq: Two times a day (BID) | ORAL | 0 refills | Status: AC

## 2019-11-04 MED ORDER — METOPROLOL TARTRATE 50 MG PO TABS: 50.0000 mg | ORAL_TABLET | Freq: Two times a day (BID) | ORAL | 0 refills | Status: AC

## 2019-11-04 MED ORDER — AMLODIPINE BESYLATE 5 MG PO TABS: 10.0000 mg | ORAL_TABLET | Freq: Every day | ORAL | 0 refills | Status: AC

## 2019-11-04 MED ORDER — HYDROCODONE-ACETAMINOPHEN 5-325 MG PO TABS
1.0000 | ORAL_TABLET | ORAL | 0 refills | Status: AC | PRN
Start: 2019-11-04 — End: ?

## 2019-11-04 MED ORDER — APIXABAN 5 MG PO TABS: 5.0000 mg | ORAL_TABLET | Freq: Two times a day (BID) | ORAL | 0 refills | Status: AC

## 2019-11-04 MED ORDER — BISACODYL 5 MG PO TBEC: 5.0000 mg | DELAYED_RELEASE_TABLET | Freq: Every day | ORAL | 0 refills | Status: AC | PRN

## 2019-11-04 NOTE — Progress Notes (Signed)
Discharged to facility after being cleaned after incont of BM.  No complaints.

## 2019-11-04 NOTE — Discharge Summary (Signed)
Physician Discharge Summary  Breanna Shields GDJ:[id number] DOB: [birth year] DOA: 10/24/2019  PCP: Lorne Skeens, MD  Admit date: 10/24/2019 Discharge date: 11/04/2019  Admitted From: Home  Disposition: SNF  Recommendations for Outpatient Follow-up:  1. Follow up with PCP in 1-2 weeks 2. Please obtain BMP/CBC in one week 3. Follow up with neurosurgery in 2 weeks.  4. Delirium precaution 5. Continue to work with PT>  6. Needs Oxygen at bed time 7.  Discharge Condition; Stable.  CODE STATUS: Full code Diet recommendation: Carb Modified   Brief/Interim Summary: 79 year old with past medical history significant for hypertension, hyperlipidemia, diabetes type 2, morbid obesity with BMI of 41 presents to the emergency department for evaluation of back pain after a fall.  Evaluation in the ED revealed T9 vertebral body fracture.  Neurosurgery was consulted.  Patient was hospitalized for further management. Overnight of  9/23 patient developed A. fib with RVR. Patient also develops hospital delirium. Continue delirium precaution. Stable for discharge/.  1-A. fib with RVR: -Patient was given RVR. She was a started on Cardizem same infusion. Subsequently wean off cardizem  She was already on metoprolol. Her metoprolol dose was increased and subsequently still TSN infusion was discontinued TSH was normal. Echocardiogram: Normal ejection fraction no wall motion abnormality. Evaluation was discussed with neurosurgery okay to start heparin and if needed Eliquis She was transitioned to Eliquis 9/24 HR stable.   2-T9 vertebral compression fracture Secondary to a fall. Neurosurgery was consulted. Patient underwent surgery on 9/19; thoracic 7 thoracic 11 percutaneous pedicle screw placement, ORIF T9 fracture with segmental pedicle screw fixation T7-T11. Continue with pain control and bowel regimen. Patient will require placement in skilled nursing facility. Needs to follow up with Dr   Arneta Cliche in 2 weeks.  Continue with PT>   Delirium: -Secondary to hospital stay, surgery and anesthesia as well. -Mentation fluctuates.  -Awaiting MRI to be done.  She was agitated last night, received haldol 9/27 She is alert, conversant. Calm.  -MRI negative for stroke.   Hypoxia; X-ray show atelectasis. Desaturation adamantly while she is sleeping, suggesting patient may have undiagnosed sleep apnea. She will need sleep study, as an outpatient Continue with oxygen while sleeping.   Essential hypertension: Continue on ARB, continue metoprolol Plan to increase norvasc to 10 mg.   Hyperlipidemia: Continue with statins  History of depression and anxiety: Continue with Lexapro  Normocytic anemia/leukocytosis: No evidence of overt bleeding. Hemoglobin stable  Diabetes  mellitus type II: Continue with a sliding scale insulin. Globin A1c 7.9.  UTI with E. coli and Klebsiella/urinary retention: UA showed moderate leukocytes and positive nitrite.  Blood cell 11-20. Growth E. coli and Klebsiella. Received ceftriaxone for 7 days. Voiding trial 9/24   Discharge Diagnoses:  Principal Problem:   T9 vertebral fracture ([place]) Active Problems:   Mixed dyslipidemia   Hypertension, essential   DM (diabetes mellitus) ([place])   Depression   Leukocytosis   Closed fracture of thoracic spine without spinal cord lesion [facility])   Atrial fibrillation with RVR [facility])    Discharge Instructions  Discharge Instructions    Diet - low sodium heart healthy   Complete by: As directed    Discharge wound care:   Complete by: As directed    See recommendation from neurosurgery   Increase activity slowly   Complete by: As directed      Allergies as of 11/04/2019      Reactions   Chantix [varenicline Tartrate] Nausea Only, Other (See Comments)   Dizziness  Oxytrol [oxybutynin Base] Other (See Comments)   Dry mouth   Varenicline Nausea Only      Medication List    TAKE these  medications   amLODipine 5 MG tablet Commonly known as: NORVASC Take 2 tablets (10 mg total) by mouth daily. Start taking on: November 05, 2019   apixaban 5 MG Tabs tablet Commonly known as: ELIQUIS Take 1 tablet (5 mg total) by mouth 2 (two) times daily.   B-complex with vitamin C tablet Take 1 tablet by mouth daily.   bisacodyl 5 MG EC tablet Commonly known as: DULCOLAX Take 1 tablet (5 mg total) by mouth daily as needed for moderate constipation.   escitalopram 10 MG tablet Commonly known as: LEXAPRO Take 10 mg by mouth daily.   HYDROcodone-acetaminophen 5-325 MG tablet Commonly known as: NORCO/VICODIN Take 1-2 tablets by mouth every 4 (four) hours as needed for moderate pain (pain score 4-6).   meclizine 25 MG tablet Commonly known as: ANTIVERT Take 25 mg by mouth every 6 (six) hours as needed for dizziness.   metoprolol tartrate 50 MG tablet Commonly known as: LOPRESSOR Take 1 tablet (50 mg total) by mouth 2 (two) times daily.   multivitamin with minerals tablet Take 1 tablet by mouth daily.   nystatin 100000 UNIT/ML suspension Commonly known as: MYCOSTATIN Take 5 mLs (500,000 Units total) by mouth 4 (four) times daily.   olmesartan 40 MG tablet Commonly known as: BENICAR Take 40 mg by mouth daily.   ONE TOUCH ULTRA TEST test strip Generic drug: glucose blood   pioglitazone 30 MG tablet Commonly known as: ACTOS Take 30 mg by mouth Daily.   senna 8.6 MG Tabs tablet Commonly known as: SENOKOT Take 1 tablet (8.6 mg total) by mouth 2 (two) times daily.   Trulicity 5.18 AC/1.6SA Sopn Generic drug: Dulaglutide Inject 0.75 mg into the skin every Tuesday at 6 PM.   Vitamin D3 50 MCG (2000 UT) Tabs Take 2,000 Units by mouth daily.            Discharge Care Instructions  (From admission, onward)         Start     Ordered   11/04/19 0000  Discharge wound care:       Comments: See recommendation from neurosurgery   11/04/19 1035           Follow-up Information    Ashok Pall, MD Follow up in 2 week(s).   Specialty: Neurosurgery Why: please call to make an appointment 2 weeks after discharge from [place] information: [address] [place] [address] [phone]              Allergies  Allergen Reactions  . Chantix [Varenicline Tartrate] Nausea Only and Other (See Comments)    Dizziness  . Oxytrol [Oxybutynin Base] Other (See Comments)    Dry mouth   . Varenicline Nausea Only    Consultations:  Neurosurgery    Procedures/Studies: DG Chest 1 View  Result Date: 10/26/2019 CLINICAL DATA:  Central line placement EXAM: CHEST  1 VIEW COMPARISON:  10/25/2019 FINDINGS: Left jugular central venous catheter tip in the mid SVC. No pneumothorax. Heart size prominent. Negative for heart failure or edema. Mild atelectasis bilaterally. No effusion Interval placement of thoracic spine fusion hardware in the mid and lower thoracic spine. IMPRESSION: Satisfactory central line placement Mild bibasilar atelectasis.  Is Electronically Signed   By: Franchot Gallo M.D.   On: 10/26/2019 14:35   DG  Chest 1 View  Result Date: 10/25/2019 CLINICAL DATA:  Fall, chest pain EXAM: CHEST  1 VIEW COMPARISON:  None. FINDINGS: Lungs are clear. No pneumothorax or pleural effusion. Cardiac size is mildly enlarged. Pulmonary vascularity is normal. No acute bone abnormality. IMPRESSION: No active disease. Electronically Signed   By: Fidela Salisbury MD   On: 10/25/2019 01:33   DG Thoracic Spine 2 View  Result Date: 10/26/2019 CLINICAL DATA:  Thoracic spine fracture fixation EXAM: THORACIC SPINE 2 VIEWS; DG C-ARM 1-60 MIN COMPARISON:  MRI thoracic spine 10/25/2019 FINDINGS: AP and lateral C-arm images of the thoracic spine demonstrate bilateral pedicle screw and rod fusion in the lower thoracic spine. Accurate level determination is not possible on these limited images. IMPRESSION: Bilateral pedicle screw  and rod fusion lower thoracic spine. Electronically Signed   By: Franchot Gallo M.D.   On: 10/26/2019 14:34   DG Thoracic Spine 2 View  Addendum Date: 10/25/2019   ADDENDUM REPORT: 10/25/2019 01:33 ADDENDUM: These results were called by telephone at the time of interpretation on 10/25/2019 at 1:31 am to provider [facility] , who verbally acknowledged these results. Electronically Signed   By: Fidela Salisbury MD   On: 10/25/2019 01:33   Result Date: 10/25/2019 CLINICAL DATA:  Fall, back pain EXAM: THORACIC SPINE 2 VIEWS COMPARISON:  None. FINDINGS: There is a a transverse fracture involving the inferior endplate of T9 with mild angular deformity of the thoracic spine at this level. There is mild paravertebral stripe widening in this region likely representing edema or hemorrhage secondary to acute fracture. No associated listhesis. Degenerative changes are seen throughout the thoracic spine. Vertebral body height has been preserved. IMPRESSION: Acute fracture of the thoracic spine with angular deformity at T9. CT examination of the thoracic spine is recommended for further evaluation. Electronically Signed: By: Fidela Salisbury MD On: 10/25/2019 01:29   DG Lumbar Spine Complete  Result Date: 10/25/2019 CLINICAL DATA:  Fall, back EXAM: LUMBAR SPINE - COMPLETE 4+ VIEW COMPARISON:  None. FINDINGS: There is normal lumbar lordosis. No acute fracture or listhesis of the lumbar spine. Vertebral body height has been preserved. There is diffuse intervertebral disc space narrowing and endplate remodeling throughout the lumbar spine in keeping with changes of mild to moderate degenerative disc disease. Oblique views demonstrate no evidence of pars defect. The paraspinal soft tissues are unremarkable. Vascular calcifications are seen within the abdominal aorta anterior to the lumbar spine. IMPRESSION: No acute fracture or listhesis.  Diffuse degenerative disc disease. Electronically Signed   By: Fidela Salisbury MD    On: 10/25/2019 01:30   CT HEAD WO CONTRAST  Result Date: 10/25/2019 CLINICAL DATA:  Slipped and fell EXAM: CT HEAD WITHOUT CONTRAST TECHNIQUE: Contiguous axial images were obtained from the base of the skull through the vertex without intravenous contrast. COMPARISON:  None. FINDINGS: Brain: No acute infarct or hemorrhage. Lateral ventricles and midline structures are unremarkable. No acute extra-axial fluid collections. No mass effect. Vascular: No hyperdense vessel or unexpected calcification. Skull: Normal. Negative for fracture or focal lesion. Sinuses/Orbits: No acute finding. Other: None. IMPRESSION: 1. No acute intracranial process. Electronically Signed   By: Randa Ngo M.D.   On: 10/25/2019 01:11   CT CERVICAL SPINE WO CONTRAST  Result Date: 10/25/2019 CLINICAL DATA:  Slipped and fell EXAM: CT CERVICAL SPINE WITHOUT CONTRAST TECHNIQUE: Multidetector CT imaging of the cervical spine was performed without intravenous contrast. Multiplanar CT image reconstructions were also generated. COMPARISON:  None. FINDINGS: Alignment: Alignment is anatomic.  Skull base and vertebrae: No acute displaced cervical spine fracture. Soft tissues and spinal canal: No prevertebral fluid or swelling. No visible canal hematoma. Disc levels: There is mild diffuse cervical spondylosis, eccentric to the right. Mild C6/C7 spondylosis. Mild right neural foraminal narrowing at C4-5 and C5-6. Upper chest: Airway is patent.  Lung apices are clear. Other: Reconstructed images demonstrate no additional findings. IMPRESSION: 1. No acute cervical spine fracture. 2. Multilevel cervical spondylosis and facet hypertrophy. Electronically Signed   By: Randa Ngo M.D.   On: 10/25/2019 01:09   CT Thoracic Spine Wo Contrast  Result Date: 10/25/2019 CLINICAL DATA:  Traumatic spinal fracture EXAM: CT THORACIC SPINE WITHOUT CONTRAST TECHNIQUE: Multidetector CT images of the thoracic were obtained using the standard protocol without  intravenous contrast. COMPARISON:  10/25/2019 thoracic spine radiograph FINDINGS: Alignment: Physiologic Vertebrae: There is an acute fracture of T9 that involves the anterior wall and superior endplate. The fracture line also traverses a right-sided osteophyte between T9 and T8. There is ankylosis of the thoracic spine due to flowing anterior enthesophytes. Paraspinal and other soft tissues: Small right pleural effusion. There are coronary artery calcifications and calcific aortic atherosclerosis. Disc levels: There is no spinal canal stenosis. IMPRESSION: 1. Mildly distracted fracture of T9 in the context of a rigid midthoracic spine. Thoracic spine MRI is recommended to assess for spinal cord injury and ligamentous damage. 2. Small right pleural effusion. Aortic Atherosclerosis (ICD10-I70.0). Electronically Signed   By: Ulyses Jarred M.D.   On: 10/25/2019 03:08   MR BRAIN WO CONTRAST  Result Date: 11/03/2019 CLINICAL DATA:  Delirium EXAM: MRI HEAD WITHOUT CONTRAST TECHNIQUE: Multiplanar, multiecho pulse sequences of the brain and surrounding structures were obtained without intravenous contrast. COMPARISON:  None. FINDINGS: Brain: No acute infarct, acute hemorrhage or extra-axial collection. Multifocal hyperintense T2-weighted signal within the white matter. Normal volume of CSF spaces. No chronic microhemorrhage. Normal midline structures. Vascular: Normal flow voids. Skull and upper cervical spine: Normal marrow signal. Sinuses/Orbits: Negative. Other: None. IMPRESSION: 1. No acute intracranial abnormality. 2. Multifocal hyperintense T2-weighted signal within the white matter, most commonly indicating chronic microvascular ischemia. Electronically Signed   By: Ulyses Jarred M.D.   On: 11/03/2019 19:57   MR THORACIC SPINE WO CONTRAST  Result Date: 10/25/2019 CLINICAL DATA:  Fall.  Traumatic spinal fracture. EXAM: MRI THORACIC SPINE WITHOUT CONTRAST TECHNIQUE: Multiplanar, multisequence MR imaging of the  thoracic spine was performed. No intravenous contrast was administered. COMPARISON:  CT thoracic spine 10/25/2019 FINDINGS: Alignment: Mild anterolisthesis T1-2 and T2-3. Mild to moderate dorsal kyphosis. Vertebrae: Fracture T9 vertebral body. Well-defined fracture line is seen in the vertebral body extending to the inferior endplate. Improved alignment compared with the CT when there was distraction of the inferior portion of the fracture. Surprisingly, there is minimal bone marrow edema around the fracture. The fracture appears acute on CT. No other fracture identified. No epidural hematoma. Negative for mass lesion. Cord:  Normal signal and morphology.  No cord compression Paraspinal and other soft tissues: Small right pleural effusion. No paraspinous hematoma. Disc levels: Multilevel disc degeneration throughout the thoracic spine. Small right-sided disc protrusion at T6-7. Small left-sided disc protrusion and spurring at T7-8. Small central disc protrusion and spurring at T8-9. Small central disc protrusion T2-10 11 and T11-12. IMPRESSION: Fracture T9 vertebral body. This fracture shows improved alignment compared with CT earlier today with decreased distraction. The fracture appears acute on CT. Surprisingly there is minimal bone marrow edema. No epidural hematoma or spinal stenosis. Electronically Signed  By: Franchot Gallo M.D.   On: 10/25/2019 12:15   DG C-Arm 1-60 Min  Result Date: 10/26/2019 CLINICAL DATA:  Thoracic spine fracture fixation EXAM: THORACIC SPINE 2 VIEWS; DG C-ARM 1-60 MIN COMPARISON:  MRI thoracic spine 10/25/2019 FINDINGS: AP and lateral C-arm images of the thoracic spine demonstrate bilateral pedicle screw and rod fusion in the lower thoracic spine. Accurate level determination is not possible on these limited images. IMPRESSION: Bilateral pedicle screw and rod fusion lower thoracic spine. Electronically Signed   By: Franchot Gallo M.D.   On: 10/26/2019 14:34   ECHOCARDIOGRAM  COMPLETE  Result Date: 10/30/2019    ECHOCARDIOGRAM REPORT   Patient Name:   HARBOR PASTER Date of Exam: 10/30/2019 Medical Rec #:  [id number]        Height:       63.0 in Accession #:    [id number]       Weight:       254.2 lb Date of Birth:  11/27/1940         BSA:          2.141 m Patient Age:    50 years         BP:           130/67 mmHg Patient Gender: F                HR:           62 bpm. Exam Location:  Inpatient Procedure: 2D Echo Indications:    atrial fibrillation 427.31  History:        Patient has no prior history of Echocardiogram examinations.                 Risk Factors:Diabetes, Hypertension, Dyslipidemia and Former                 Smoker.  Sonographer:    Jannett Celestine RDCS (AE) Referring Phys: [id number]  Sonographer Comments: Image acquisition challenging due to patient body habitus and restricted mobility. IMPRESSIONS  1. Left ventricular ejection fraction, by estimation, is 60 to 65%. The left ventricle has normal function. The left ventricle has no regional wall motion abnormalities. There is mild left ventricular hypertrophy. Left ventricular diastolic parameters are consistent with Grade II diastolic dysfunction (pseudonormalization).  2. Right ventricular systolic function is normal. The right ventricular size is normal.  3. The mitral valve is grossly normal. No evidence of mitral valve regurgitation.  4. The aortic valve is grossly normal. Aortic valve regurgitation is not visualized. FINDINGS  Left Ventricle: Left ventricular ejection fraction, by estimation, is 60 to 65%. The left ventricle has normal function. The left ventricle has no regional wall motion abnormalities. The left ventricular internal cavity size was normal in size. There is  mild left ventricular hypertrophy. Left ventricular diastolic parameters are consistent with Grade II diastolic dysfunction (pseudonormalization). Right Ventricle: The right ventricular size is normal. No increase in right ventricular  wall thickness. Right ventricular systolic function is normal. Left Atrium: Left atrial size was normal in size. Right Atrium: Right atrial size was not assessed. Pericardium: There is no evidence of pericardial effusion. Mitral Valve: The mitral valve is grossly normal. No evidence of mitral valve regurgitation. Tricuspid Valve: The tricuspid valve is grossly normal. Tricuspid valve regurgitation is mild. Aortic Valve: The aortic valve is grossly normal. Aortic valve regurgitation is not visualized. Pulmonic Valve: The pulmonic valve was normal in structure. Pulmonic valve regurgitation is not visualized. Aorta: The  aortic root and ascending aorta are structurally normal, with no evidence of dilitation. IAS/Shunts: The interatrial septum was not assessed.  LEFT VENTRICLE PLAX 2D LVIDd:         4.73 cm LVIDs:         2.98 cm LV PW:         1.19 cm LV IVS:        1.34 cm LVOT diam:     2.00 cm LV SV:         79 LV SV Index:   37 LVOT Area:     3.14 cm  LEFT ATRIUM           Index LA diam:      3.80 cm 1.77 cm/m LA Vol (A2C): 28.5 ml 13.31 ml/m  AORTIC VALVE LVOT Vmax:   96.00 cm/s LVOT Vmean:  70.100 cm/s LVOT VTI:    0.251 m  AORTA Ao Root diam: 3.20 cm MITRAL VALVE MV Area (PHT): 3.42 cm    SHUNTS MV Decel Time: 222 msec    Systemic VTI:  0.25 m MV E velocity: 94.70 cm/s  Systemic Diam: 2.00 cm Mertie Moores MD Electronically signed by Mertie Moores MD Signature Date/Time: 10/30/2019/3:52:11 PM    Final       Subjective: She is alert, oriented times 2. Report mild vertigo   Discharge Exam: Vitals:   11/04/19 0313 11/04/19 0734  BP: (!) 159/59 (!) 141/54  Pulse: 73 70  Resp: 19 18  Temp: 98.1 F (36.7 C) 98.1 F (36.7 C)  SpO2: 99% 96%     General: Pt is alert, awake, not in acute distress Cardiovascular: RRR, S1/S2 +, no rubs, no gallops Respiratory: CTA bilaterally, no wheezing, no rhonchi Abdominal: Soft, NT, ND, bowel sounds + Extremities: no edema, no cyanosis    The results of  significant diagnostics from this hospitalization (including imaging, microbiology, ancillary and laboratory) are listed below for reference.     Microbiology: Recent Results (from the past 240 hour(s))  Surgical pcr screen     Status: None   Collection Time: 10/25/19 10:01 PM   Specimen: Nasal Mucosa; Nasal Swab  Result Value Ref Range Status   MRSA, PCR NEGATIVE NEGATIVE Final   Staphylococcus aureus NEGATIVE NEGATIVE Final    Comment: (NOTE) The Xpert SA Assay (FDA approved for NASAL specimens in patients 62 years of age and older), is one component of a comprehensive surveillance program. It is not intended to diagnose infection nor to guide or monitor treatment. Performed at [facility], [place] [address], [place], [place] [address]   Culture, Urine     Status: Abnormal   Collection Time: 10/26/19 10:09 AM   Specimen: Urine, Random  Result Value Ref Range Status   Specimen Description URINE, RANDOM  Final   Special Requests   Final    A URINE Performed at [facility], [place] [address] [place], [place] [address]    Culture (A)  Final    >=100,000 COLONIES/mL KLEBSIELLA PNEUMONIAE >=100,000 COLONIES/mL ESCHERICHIA COLI    Report Status 10/28/2019 FINAL  Final   Organism ID, Bacteria KLEBSIELLA PNEUMONIAE (A)  Final   Organism ID, Bacteria ESCHERICHIA COLI (A)  Final      Susceptibility   Escherichia coli - MIC*    AMPICILLIN <=2 SENSITIVE Sensitive     CEFAZOLIN <=4 SENSITIVE Sensitive     CEFTRIAXONE <=0.25 SENSITIVE Sensitive     CIPROFLOXACIN <=0.25 SENSITIVE Sensitive     GENTAMICIN <=  1 SENSITIVE Sensitive     IMIPENEM <=0.25 SENSITIVE Sensitive     NITROFURANTOIN <=16 SENSITIVE Sensitive     TRIMETH/SULFA <=20 SENSITIVE Sensitive     AMPICILLIN/SULBACTAM <=2 SENSITIVE Sensitive     PIP/TAZO <=4 SENSITIVE Sensitive     * >=100,000 COLONIES/mL ESCHERICHIA COLI   Klebsiella pneumoniae - MIC*    AMPICILLIN >=32 RESISTANT Resistant     CEFAZOLIN <=4  SENSITIVE Sensitive     CEFTRIAXONE <=0.25 SENSITIVE Sensitive     CIPROFLOXACIN <=0.25 SENSITIVE Sensitive     GENTAMICIN <=1 SENSITIVE Sensitive     IMIPENEM <=0.25 SENSITIVE Sensitive     NITROFURANTOIN <=16 SENSITIVE Sensitive     TRIMETH/SULFA <=20 SENSITIVE Sensitive     AMPICILLIN/SULBACTAM 8 SENSITIVE Sensitive     PIP/TAZO <=4 SENSITIVE Sensitive     * >=100,000 COLONIES/mL KLEBSIELLA PNEUMONIAE     Labs: BNP (last 3 results) No results for input(s): BNP in the last 8760 hours. Basic Metabolic Panel: Recent Labs  Lab 10/30/19 0416 10/31/19 0907 11/01/19 0616 11/02/19 0439 11/03/19 0247  NA 139 138 141 139 136  K 4.1 3.6 4.3 4.3 4.6  CL 106 102 105 102 101  CO2 22 26 26 24 28   GLUCOSE 169* 92 148* 151* 219*  BUN 18 19 16 12 11   CREATININE 0.84 0.76 0.78 0.72 0.64  CALCIUM 9.1 9.4 9.2 9.1 9.1  MG 2.0  --  2.0  --   --    Liver Function Tests: Recent Labs  Lab 10/28/19 1147 10/29/19 0500 10/30/19 0416 10/31/19 0907  AST 27 29 28 29   ALT 24 28 32 35  ALKPHOS 65 69 68 68  BILITOT 0.9 1.1 0.9 0.2*  PROT 5.9* 6.4* 6.0* 6.4*  ALBUMIN 2.7* 2.7* 2.5* 2.8*   No results for input(s): LIPASE, AMYLASE in the last 168 hours. No results for input(s): AMMONIA in the last 168 hours. CBC: Recent Labs  Lab 10/29/19 0500 10/30/19 0416 10/31/19 0907 11/01/19 0616 11/02/19 0439  WBC 13.9* 11.0* 11.9* 9.4 10.6*  HGB 9.9* 10.1* 10.5* 10.7* 10.3*  HCT 31.8* 32.2* 33.0* 33.7* 32.1*  MCV 96.1 96.7 95.4 96.0 94.7  PLT 236 261 321 304 328   Cardiac Enzymes: No results for input(s): CKTOTAL, CKMB, CKMBINDEX, TROPONINI in the last 168 hours. BNP: Invalid input(s): POCBNP CBG: Recent Labs  Lab 11/03/19 0618 11/03/19 1133 11/03/19 1619 11/03/19 2115 11/04/19 0600  GLUCAP 130* 210* 93 144* 151*   D-Dimer No results for input(s): DDIMER in the last 72 hours. Hgb A1c No results for input(s): HGBA1C in the last 72 hours. Lipid Profile No results for input(s):  CHOL, HDL, LDLCALC, TRIG, CHOLHDL, LDLDIRECT in the last 72 hours. Thyroid function studies No results for input(s): TSH, T4TOTAL, T3FREE, THYROIDAB in the last 72 hours.  Invalid input(s): FREET3 Anemia work up No results for input(s): VITAMINB12, FOLATE, FERRITIN, TIBC, IRON, RETICCTPCT in the last 72 hours. Urinalysis    Component Value Date/Time   COLORURINE YELLOW 10/26/2019 1108   APPEARANCEUR CLEAR 10/26/2019 1108   LABSPEC 1.015 10/26/2019 1108   PHURINE 5.0 10/26/2019 1108   GLUCOSEU NEGATIVE 10/26/2019 1108   HGBUR SMALL (A) 10/26/2019 1108   BILIRUBINUR NEGATIVE 10/26/2019 1108   KETONESUR NEGATIVE 10/26/2019 1108   PROTEINUR NEGATIVE 10/26/2019 1108   NITRITE POSITIVE (A) 10/26/2019 1108   LEUKOCYTESUR TRACE (A) 10/26/2019 1108   Sepsis Labs Invalid input(s): PROCALCITONIN,  WBC,  LACTICIDVEN Microbiology Recent Results (from the past 240 hour(s))  Surgical pcr  screen     Status: None   Collection Time: 10/25/19 10:01 PM   Specimen: Nasal Mucosa; Nasal Swab  Result Value Ref Range Status   MRSA, PCR NEGATIVE NEGATIVE Final   Staphylococcus aureus NEGATIVE NEGATIVE Final    Comment: (NOTE) The Xpert SA Assay (FDA approved for NASAL specimens in patients 44 years of age and older), is one component of a comprehensive surveillance program. It is not intended to diagnose infection nor to guide or monitor treatment. Performed at [facility], [place] [address], [place], [place] [address]   Culture, Urine     Status: Abnormal   Collection Time: 10/26/19 10:09 AM   Specimen: Urine, Random  Result Value Ref Range Status   Specimen Description URINE, RANDOM  Final   Special Requests   Final    A URINE Performed at [facility], [place] [address], [place], [place] [address]    Culture (A)  Final    >=100,000 COLONIES/mL KLEBSIELLA PNEUMONIAE >=100,000 COLONIES/mL ESCHERICHIA COLI    Report Status 10/28/2019 FINAL  Final   Organism ID, Bacteria KLEBSIELLA  PNEUMONIAE (A)  Final   Organism ID, Bacteria ESCHERICHIA COLI (A)  Final      Susceptibility   Escherichia coli - MIC*    AMPICILLIN <=2 SENSITIVE Sensitive     CEFAZOLIN <=4 SENSITIVE Sensitive     CEFTRIAXONE <=0.25 SENSITIVE Sensitive     CIPROFLOXACIN <=0.25 SENSITIVE Sensitive     GENTAMICIN <=1 SENSITIVE Sensitive     IMIPENEM <=0.25 SENSITIVE Sensitive     NITROFURANTOIN <=16 SENSITIVE Sensitive     TRIMETH/SULFA <=20 SENSITIVE Sensitive     AMPICILLIN/SULBACTAM <=2 SENSITIVE Sensitive     PIP/TAZO <=4 SENSITIVE Sensitive     * >=100,000 COLONIES/mL ESCHERICHIA COLI   Klebsiella pneumoniae - MIC*    AMPICILLIN >=32 RESISTANT Resistant     CEFAZOLIN <=4 SENSITIVE Sensitive     CEFTRIAXONE <=0.25 SENSITIVE Sensitive     CIPROFLOXACIN <=0.25 SENSITIVE Sensitive     GENTAMICIN <=1 SENSITIVE Sensitive     IMIPENEM <=0.25 SENSITIVE Sensitive     NITROFURANTOIN <=16 SENSITIVE Sensitive     TRIMETH/SULFA <=20 SENSITIVE Sensitive     AMPICILLIN/SULBACTAM 8 SENSITIVE Sensitive     PIP/TAZO <=4 SENSITIVE Sensitive     * >=100,000 COLONIES/mL KLEBSIELLA PNEUMONIAE     Time coordinating discharge: 40 minutes  SIGNED:   Elmarie Shiley, MD  [facility]

## 2019-11-04 NOTE — Progress Notes (Signed)
Called report to Dolliver at receiving facility all questions answered. Patient and spouse aware of discharge.

## 2019-11-04 NOTE — TOC Transition Note (Signed)
Transition of Care [facility]) - CM/SW Discharge Note   Patient Details  Name: Breanna Shields MRN: [id number] Date of Birth: 02/18/1940  Transition of Care [facility]) CM/SW Contact:  Marney Setting, [place] Work Phone Number: 11/04/2019, 12:09 PM   Clinical Narrative:   Nurse to call report to [phone]. Rm. 32    Final next level of care: [place] Barriers to Discharge: Barriers Resolved   Patient Goals and CMS Choice Patient states their goals for this hospitalization and ongoing recovery are:: patient unable to participate in goal setting due to disorientation CMS Medicare.gov Compare Post Acute Care list provided to:: Patient Represenative (must comment) Choice offered to / list presented to : Spouse  Discharge Placement              Patient chooses bed at: [facility] Patient to be transferred to facility by: [place] Name of family member notified: Melissa Patient and family notified of of transfer: 11/04/19  Discharge Plan and Services     Post Acute Care Choice: [place]                               Social Determinants of Health (SDOH) Interventions     Readmission Risk Interventions No flowsheet data found.

## 2019-11-05 ENCOUNTER — Other Ambulatory Visit: Payer: Self-pay

## 2019-11-05 NOTE — Patient Outreach (Signed)
  [place] [facility]) Care Management Chronic Special Needs Program    11/05/2019  Name: Breanna Shields, DOB: July 30, 1940  MRN: [id number]   [name] is enrolled in a chronic special needs plan for Diabetes. Client admitted on 10/24/19 for T9 vertebral fracture from fall, THORACIC SEVEN-THORACIC ELEVEN PERCUTANEOUS PEDICLE SCREW PLACEMENT on 10/26/19.  Discharged to skilled nursing facility [facility] on 11/04/19. RNCM will follow up post discharge from skilled facility and as needed.  Peter Garter RN, Jackquline Denmark, CDE Chronic Care Management Coordinator [facility] Care Management [phone]

## 2019-11-05 NOTE — Anesthesia Postprocedure Evaluation (Signed)
Anesthesia Post Note  Patient: Breanna Shields  Procedure(s) Performed: THORACIC SEVEN-THORACIC ELEVEN PERCUTANEOUS PEDICLE SCREW PLACEMENT (N/A Spine Thoracic)     Patient location during evaluation: PACU Anesthesia Type: General Level of consciousness: awake and alert Pain management: pain level controlled Vital Signs Assessment: post-procedure vital signs reviewed and stable Respiratory status: spontaneous breathing, nonlabored ventilation, respiratory function stable and patient connected to nasal cannula oxygen Cardiovascular status: blood pressure returned to baseline and stable Postop Assessment: no apparent nausea or vomiting Anesthetic complications: no   No complications documented.  Last Vitals:  Vitals:   11/04/19 0734 11/04/19 1138  BP: (!) 141/54 (!) 148/46  Pulse: 70 70  Resp: 18 20  Temp: 36.7 C 36.7 C  SpO2: 96% 96%    Last Pain:  Vitals:   11/04/19 1138  TempSrc: Oral  PainSc: 0-No pain                 Ibrohim Simmers COKER

## 2019-11-06 DIAGNOSIS — F418 Other specified anxiety disorders: Secondary | ICD-10-CM | POA: Diagnosis not present

## 2019-11-06 DIAGNOSIS — S22009A Unspecified fracture of unspecified thoracic vertebra, initial encounter for closed fracture: Secondary | ICD-10-CM | POA: Diagnosis not present

## 2019-11-06 DIAGNOSIS — G894 Chronic pain syndrome: Secondary | ICD-10-CM | POA: Diagnosis not present

## 2019-11-06 DIAGNOSIS — E119 Type 2 diabetes mellitus without complications: Secondary | ICD-10-CM | POA: Diagnosis not present

## 2019-11-12 DIAGNOSIS — I4891 Unspecified atrial fibrillation: Secondary | ICD-10-CM | POA: Diagnosis not present

## 2019-11-12 DIAGNOSIS — R339 Retention of urine, unspecified: Secondary | ICD-10-CM | POA: Diagnosis not present

## 2019-11-12 DIAGNOSIS — N390 Urinary tract infection, site not specified: Secondary | ICD-10-CM | POA: Diagnosis not present

## 2019-11-12 DIAGNOSIS — S22009D Unspecified fracture of unspecified thoracic vertebra, subsequent encounter for fracture with routine healing: Secondary | ICD-10-CM | POA: Diagnosis not present

## 2019-11-17 DIAGNOSIS — R600 Localized edema: Secondary | ICD-10-CM | POA: Diagnosis not present

## 2019-11-17 DIAGNOSIS — N390 Urinary tract infection, site not specified: Secondary | ICD-10-CM | POA: Diagnosis not present

## 2019-11-17 DIAGNOSIS — R42 Dizziness and giddiness: Secondary | ICD-10-CM | POA: Diagnosis not present

## 2019-11-17 DIAGNOSIS — R410 Disorientation, unspecified: Secondary | ICD-10-CM | POA: Diagnosis not present

## 2019-11-19 DIAGNOSIS — R339 Retention of urine, unspecified: Secondary | ICD-10-CM | POA: Diagnosis not present

## 2019-11-19 DIAGNOSIS — R600 Localized edema: Secondary | ICD-10-CM | POA: Diagnosis not present

## 2019-11-19 DIAGNOSIS — I4891 Unspecified atrial fibrillation: Secondary | ICD-10-CM | POA: Diagnosis not present

## 2019-11-19 DIAGNOSIS — N390 Urinary tract infection, site not specified: Secondary | ICD-10-CM | POA: Diagnosis not present

## 2019-11-19 DIAGNOSIS — S22009D Unspecified fracture of unspecified thoracic vertebra, subsequent encounter for fracture with routine healing: Secondary | ICD-10-CM | POA: Diagnosis not present

## 2019-11-26 DIAGNOSIS — R441 Visual hallucinations: Secondary | ICD-10-CM | POA: Diagnosis not present

## 2019-11-26 DIAGNOSIS — R4182 Altered mental status, unspecified: Secondary | ICD-10-CM | POA: Diagnosis not present

## 2019-11-26 DIAGNOSIS — R600 Localized edema: Secondary | ICD-10-CM | POA: Diagnosis not present

## 2019-11-26 DIAGNOSIS — S22009D Unspecified fracture of unspecified thoracic vertebra, subsequent encounter for fracture with routine healing: Secondary | ICD-10-CM | POA: Diagnosis not present

## 2019-12-03 DIAGNOSIS — R4182 Altered mental status, unspecified: Secondary | ICD-10-CM | POA: Diagnosis not present

## 2019-12-03 DIAGNOSIS — N390 Urinary tract infection, site not specified: Secondary | ICD-10-CM | POA: Diagnosis not present

## 2019-12-03 DIAGNOSIS — R441 Visual hallucinations: Secondary | ICD-10-CM | POA: Diagnosis not present

## 2019-12-03 DIAGNOSIS — S22009D Unspecified fracture of unspecified thoracic vertebra, subsequent encounter for fracture with routine healing: Secondary | ICD-10-CM | POA: Diagnosis not present

## 2019-12-04 DIAGNOSIS — R600 Localized edema: Secondary | ICD-10-CM | POA: Diagnosis not present

## 2019-12-04 DIAGNOSIS — R441 Visual hallucinations: Secondary | ICD-10-CM | POA: Diagnosis not present

## 2019-12-04 DIAGNOSIS — R4182 Altered mental status, unspecified: Secondary | ICD-10-CM | POA: Diagnosis not present

## 2019-12-04 DIAGNOSIS — N390 Urinary tract infection, site not specified: Secondary | ICD-10-CM | POA: Diagnosis not present

## 2019-12-15 DIAGNOSIS — R410 Disorientation, unspecified: Secondary | ICD-10-CM | POA: Diagnosis not present

## 2019-12-15 DIAGNOSIS — R339 Retention of urine, unspecified: Secondary | ICD-10-CM | POA: Diagnosis not present

## 2019-12-15 DIAGNOSIS — S22009D Unspecified fracture of unspecified thoracic vertebra, subsequent encounter for fracture with routine healing: Secondary | ICD-10-CM | POA: Diagnosis not present

## 2019-12-15 DIAGNOSIS — R42 Dizziness and giddiness: Secondary | ICD-10-CM | POA: Diagnosis not present

## 2019-12-15 DIAGNOSIS — R600 Localized edema: Secondary | ICD-10-CM | POA: Diagnosis not present

## 2019-12-15 DIAGNOSIS — I4891 Unspecified atrial fibrillation: Secondary | ICD-10-CM | POA: Diagnosis not present

## 2019-12-16 ENCOUNTER — Other Ambulatory Visit: Payer: Self-pay

## 2019-12-16 NOTE — Patient Outreach (Signed)
  [facility]) Care Management Chronic Special Needs Program  12/16/2019  Name: Breanna Shields DOB: November 11, 1940  MRN: [id number]  [name] is enrolled in a chronic special needs plan for Diabetes. Reviewed and updated care plan.  Client admitted on 10/24/19 for T9 vertebral fracture from fall, THORACIC SEVEN-THORACIC ELEVEN PERCUTANEOUS PEDICLE SCREW PLACEMENT on 10/26/19.  Discharged to skilled nursing facility [facility] on 11/04/19.  Discharged on 12/15/19  Goals Addressed            This Visit's Progress   . Client will report no fall or injuries in the next 6 months.       Stand up slowly Use cane or walker at all times Use grabber to pick up objects on the floor Keep home well-lit and wear your glasses  Sent brochure Check for Safety    . General - Client will not be readmitted within 30 days (C-SNP)discharged rehab 12/15/19       Client admitted on 10/24/19 for T9 vertebral fracture from fall, THORACIC SEVEN-THORACIC ELEVEN PERCUTANEOUS PEDICLE SCREW PLACEMENT on 10/26/19.  Discharged to skilled nursing facility [facility]) on 11/04/19. Discharged rehab on 12/15/19 Transition of care care by [place] discharge Please follow discharge instructions and call provider if you have any questions. Please attend all follow up appointments as scheduled. Please take your medications as prescribed. Please call 24 Hour nurse advice line as needed [phone]).        Plan:  Send outreach letter with a copy of their individualized care plan, Send individual care plan to provider and Send educational material Brochure Check for safety  Chronic care management coordinator will outreach in:  1-2 months     [place], [employer], [place] Management Coordinator [place] Management [phone]

## 2019-12-17 DIAGNOSIS — R42 Dizziness and giddiness: Secondary | ICD-10-CM | POA: Diagnosis not present

## 2019-12-17 DIAGNOSIS — I4891 Unspecified atrial fibrillation: Secondary | ICD-10-CM | POA: Diagnosis not present

## 2019-12-17 DIAGNOSIS — R600 Localized edema: Secondary | ICD-10-CM | POA: Diagnosis not present

## 2019-12-17 DIAGNOSIS — H6690 Otitis media, unspecified, unspecified ear: Secondary | ICD-10-CM | POA: Diagnosis not present

## 2019-12-17 DIAGNOSIS — S22009D Unspecified fracture of unspecified thoracic vertebra, subsequent encounter for fracture with routine healing: Secondary | ICD-10-CM | POA: Diagnosis not present

## 2019-12-22 ENCOUNTER — Emergency Department (HOSPITAL_COMMUNITY): Payer: HMO

## 2019-12-22 ENCOUNTER — Emergency Department (HOSPITAL_COMMUNITY)
Admission: EM | Admit: 2019-12-22 | Discharge: 2019-12-22 | Disposition: A | Discharge status: Home / Self Care | Payer: HMO | Attending: Emergency Medicine | Admitting: Emergency Medicine

## 2019-12-22 DIAGNOSIS — W06XXXA Fall from bed, initial encounter: Secondary | ICD-10-CM | POA: Diagnosis not present

## 2019-12-22 DIAGNOSIS — Y92003 Bedroom of unspecified non-institutional (private) residence as the place of occurrence of the external cause: Secondary | ICD-10-CM | POA: Diagnosis not present

## 2019-12-22 DIAGNOSIS — S0101XA Laceration without foreign body of scalp, initial encounter: Secondary | ICD-10-CM | POA: Insufficient documentation

## 2019-12-22 DIAGNOSIS — S0990XA Unspecified injury of head, initial encounter: Secondary | ICD-10-CM | POA: Diagnosis present

## 2019-12-22 DIAGNOSIS — Z7901 Long term (current) use of anticoagulants: Secondary | ICD-10-CM

## 2019-12-22 DIAGNOSIS — S199XXA Unspecified injury of neck, initial encounter: Secondary | ICD-10-CM | POA: Diagnosis not present

## 2019-12-22 DIAGNOSIS — R519 Headache, unspecified: Secondary | ICD-10-CM | POA: Diagnosis not present

## 2019-12-22 DIAGNOSIS — Z043 Encounter for examination and observation following other accident: Secondary | ICD-10-CM | POA: Diagnosis not present

## 2019-12-22 DIAGNOSIS — M25561 Pain in right knee: Secondary | ICD-10-CM | POA: Diagnosis not present

## 2019-12-22 DIAGNOSIS — W19XXXA Unspecified fall, initial encounter: Secondary | ICD-10-CM

## 2019-12-22 LAB — CBC
HCT: 33.4 % — ABNORMAL LOW (ref 36.0–46.0)
Hemoglobin: 10.4 g/dL — ABNORMAL LOW (ref 12.0–15.0)
MCH: 30.4 pg (ref 26.0–34.0)
MCHC: 31.1 g/dL (ref 30.0–36.0)
MCV: 97.7 fL (ref 80.0–100.0)
Platelets: 329 10*3/uL (ref 150–400)
RBC: 3.42 MIL/uL — ABNORMAL LOW (ref 3.87–5.11)
RDW: 16.5 % — ABNORMAL HIGH (ref 11.5–15.5)
WBC: 6.8 10*3/uL (ref 4.0–10.5)
nRBC: 0 % (ref 0.0–0.2)

## 2019-12-22 LAB — I-STAT CHEM 8, ED
BUN: 11 mg/dL (ref 8–23)
Calcium, Ion: 1.3 mmol/L (ref 1.15–1.40)
Chloride: 109 mmol/L (ref 98–111)
Creatinine, Ser: 0.6 mg/dL (ref 0.44–1.00)
Glucose, Bld: 95 mg/dL (ref 70–99)
HCT: 32 % — ABNORMAL LOW (ref 36.0–46.0)
Hemoglobin: 10.9 g/dL — ABNORMAL LOW (ref 12.0–15.0)
Potassium: 4.3 mmol/L (ref 3.5–5.1)
Sodium: 141 mmol/L (ref 135–145)
TCO2: 22 mmol/L (ref 22–32)

## 2019-12-22 LAB — URINALYSIS, ROUTINE W REFLEX MICROSCOPIC
Bilirubin Urine: NEGATIVE
Glucose, UA: NEGATIVE mg/dL
Hgb urine dipstick: NEGATIVE
Ketones, ur: NEGATIVE mg/dL
Nitrite: NEGATIVE
Protein, ur: NEGATIVE mg/dL
Specific Gravity, Urine: 1.012 (ref 1.005–1.030)
pH: 5 (ref 5.0–8.0)

## 2019-12-22 LAB — COMPREHENSIVE METABOLIC PANEL
ALT: 11 U/L (ref 0–44)
AST: 16 U/L (ref 15–41)
Albumin: 2.9 g/dL — ABNORMAL LOW (ref 3.5–5.0)
Alkaline Phosphatase: 87 U/L (ref 38–126)
Anion gap: 9 (ref 5–15)
BUN: 11 mg/dL (ref 8–23)
CO2: 21 mmol/L — ABNORMAL LOW (ref 22–32)
Calcium: 9.3 mg/dL (ref 8.9–10.3)
Chloride: 109 mmol/L (ref 98–111)
Creatinine, Ser: 0.65 mg/dL (ref 0.44–1.00)
GFR, Estimated: >60 mL/min (ref >60)
Glucose, Bld: 96 mg/dL (ref 70–99)
Potassium: 4.3 mmol/L (ref 3.5–5.1)
Sodium: 139 mmol/L (ref 135–145)
Total Bilirubin: 0.7 mg/dL (ref 0.3–1.2)
Total Protein: 6.5 g/dL (ref 6.5–8.1)

## 2019-12-22 LAB — SAMPLE TO BLOOD BANK

## 2019-12-22 LAB — LACTIC ACID, PLASMA: Lactic Acid, Venous: 0.8 mmol/L (ref 0.5–1.9)

## 2019-12-22 LAB — ETHANOL: Alcohol, Ethyl (B): <10 mg/dL (ref <10)

## 2019-12-22 MED ORDER — ACETAMINOPHEN 500 MG PO TABS
1000.0000 mg | ORAL_TABLET | Freq: Once | ORAL | Status: AC
  Administered 2019-12-22: 1000 mg via ORAL
  Filled 2019-12-22: qty 2

## 2019-12-22 NOTE — ED Notes (Signed)
Per lab another blue top tube needs to be redrawn for PT/INR

## 2019-12-22 NOTE — ED Provider Notes (Signed)
[place] EMERGENCY DEPARTMENT Provider Note   CSN: [id number] Arrival date & time: 12/22/19  3785   History No chief complaint on file.   Breanna Shields is a 79 y.o. female.  The history is provided by the patient.  She was transferred from a skilled nursing facility as a level 2 trauma.  She is anticoagulated on apixaban and fell out of bed hitting her head on a dresser.  She did suffer a laceration.  She is also complaining of pain in her lower back.  She has at the facility for rehabilitation for back pain.  No past medical history on file.  There are no problems to display for this patient.   ** The histories are not reviewed yet. Please review them in the "History" navigator section and refresh this [place].   OB History   No obstetric history on file.     No family history on file.  Social History   Tobacco Use  . Smoking status: Not on file  Substance Use Topics  . Alcohol use: Not on file  . Drug use: Not on file    Home Medications Prior to Admission medications   Not on File    Allergies    Patient has no allergy information on record.  Review of Systems   Review of Systems  All other systems reviewed and are negative.   Physical Exam Updated Vital Signs BP 130/82   Pulse 72   Temp (!) 96.6 F (35.9 C) (Temporal)   Resp 18   SpO2 94%   Physical Exam Vitals and nursing note reviewed.   79 year old female, resting comfortably and in no acute distress. Vital signs are normal. Oxygen saturation is 94%, which is normal. Head is normocephalic.  There is a superficial laceration in the right parieto-occipital area.  This laceration does not require closure.Marland Kitchen PERRLA, EOMI. Oropharynx is clear. Neck is nontender and supple without adenopathy or JVD. Back is nontender and there is no CVA tenderness. Lungs are clear without rales, wheezes, or rhonchi. Chest is nontender. Heart has regular rate and rhythm without  murmur. Abdomen is soft, flat, nontender without masses or hepatosplenomegaly and peristalsis is normoactive. Pelvis is stable.  There is tenderness to palpation over the posterior iliac crest bilaterally. Extremities have no cyanosis or edema, full range of motion is present. Skin is warm and dry without rash. Neurologic: Mental status is normal, cranial nerves are intact, there are no motor or sensory deficits.  ED Results / Procedures / Treatments   Labs (all labs ordered are listed, but only abnormal results are displayed) Labs Reviewed  COMPREHENSIVE METABOLIC PANEL - Abnormal; Notable for the following components:      Result Value   CO2 21 (*)    Albumin 2.9 (*)    All other components within normal limits  CBC - Abnormal; Notable for the following components:   RBC 3.42 (*)    Hemoglobin 10.4 (*)    HCT 33.4 (*)    RDW 16.5 (*)    All other components within normal limits  I-STAT CHEM 8, ED - Abnormal; Notable for the following components:   Hemoglobin 10.9 (*)    HCT 32.0 (*)    All other components within normal limits  ETHANOL  LACTIC ACID, PLASMA  URINALYSIS, ROUTINE W REFLEX MICROSCOPIC  SAMPLE TO BLOOD BANK   Radiology DG Knee 2 Views Right  Result Date: 12/22/2019 CLINICAL DATA:  Level 2 fall on blood  thinners.  Right knee pain EXAM: RIGHT KNEE - 1-2 VIEW COMPARISON:  None after surgery FINDINGS: Total knee arthroplasty which appears overall well seated. No periprosthetic fracture or subluxation. No detected joint effusion IMPRESSION: 1. No acute finding. 2. Total knee arthroplasty. Electronically Signed   By: Monte Fantasia M.D.   On: 12/22/2019 06:38   CT HEAD WO CONTRAST  Result Date: 12/22/2019 CLINICAL DATA:  Fall, head injury, headache, neck tenderness, chronic anticoagulation EXAM: CT HEAD WITHOUT CONTRAST CT CERVICAL SPINE WITHOUT CONTRAST TECHNIQUE: Multidetector CT imaging of the head and cervical spine was performed following the standard protocol  without intravenous contrast. Multiplanar CT image reconstructions of the cervical spine were also generated. COMPARISON:  None. FINDINGS: CT HEAD FINDINGS Brain: Normal anatomic configuration. No abnormal intra or extra-axial mass lesion or fluid collection. No abnormal mass effect or midline shift. No evidence of acute intracranial hemorrhage or infarct. Ventricular size is normal. Cerebellum unremarkable. Vascular: Unremarkable Skull: Intact Sinuses/Orbits: Paranasal sinuses are clear. Orbits are unremarkable. Other: Mastoid air cells and middle ear cavities are clear. CT CERVICAL SPINE FINDINGS Alignment: Normal.  No listhesis. Skull base and vertebrae: The craniocervical junction is unremarkable. The atlantodental interval is normal. No acute fracture of the cervical spine. No lytic or blastic bone lesion. Soft tissues and spinal canal: No prevertebral fluid or swelling. No visible canal hematoma. Prominent broad-based disc bulge noted at C3-4 abuts the thecal sac without significant canal stenosis. Disc levels: Review of the sagittal reformats demonstrates normal cervical lordosis. Vertebral body height has been preserved. There is mild intervertebral disc space narrowing of C6-T1 and endplate remodeling noted throughout the cervical and visualized thoracic spine in keeping with changes of mild to moderate degenerative disc disease. The spinal canal is widely patent. The prevertebral soft tissues are not thickened. Review of the axial images demonstrates mild to moderate right neural foraminal narrowing at C4-5 and C5-6 secondary to marked right facet arthrosis. Broad-based disc bulge at C6-7 may impinge the exiting nerve roots bilateral 8 at this level. Upper chest: Unremarkable Other: Extensive atherosclerotic calcification is noted within the right carotid bifurcation. IMPRESSION: No acute intracranial injury.  No calvarial fracture. No acute fracture or listhesis of the cervical spine. Multilevel  degenerative disc and degenerative joint disease resulting in multilevel neural foraminal narrowing as described above. Prominent right carotid bifurcation calcifications. The degree of stenosis would be better assessed with dedicated carotid artery Doppler sonography, if indicated. Electronically Signed   By: Fidela Salisbury MD   On: 12/22/2019 06:40   CT CERVICAL SPINE WO CONTRAST  Result Date: 12/22/2019 CLINICAL DATA:  Fall, head injury, headache, neck tenderness, chronic anticoagulation EXAM: CT HEAD WITHOUT CONTRAST CT CERVICAL SPINE WITHOUT CONTRAST TECHNIQUE: Multidetector CT imaging of the head and cervical spine was performed following the standard protocol without intravenous contrast. Multiplanar CT image reconstructions of the cervical spine were also generated. COMPARISON:  None. FINDINGS: CT HEAD FINDINGS Brain: Normal anatomic configuration. No abnormal intra or extra-axial mass lesion or fluid collection. No abnormal mass effect or midline shift. No evidence of acute intracranial hemorrhage or infarct. Ventricular size is normal. Cerebellum unremarkable. Vascular: Unremarkable Skull: Intact Sinuses/Orbits: Paranasal sinuses are clear. Orbits are unremarkable. Other: Mastoid air cells and middle ear cavities are clear. CT CERVICAL SPINE FINDINGS Alignment: Normal.  No listhesis. Skull base and vertebrae: The craniocervical junction is unremarkable. The atlantodental interval is normal. No acute fracture of the cervical spine. No lytic or blastic bone lesion. Soft tissues and spinal canal: No  prevertebral fluid or swelling. No visible canal hematoma. Prominent broad-based disc bulge noted at C3-4 abuts the thecal sac without significant canal stenosis. Disc levels: Review of the sagittal reformats demonstrates normal cervical lordosis. Vertebral body height has been preserved. There is mild intervertebral disc space narrowing of C6-T1 and endplate remodeling noted throughout the cervical and  visualized thoracic spine in keeping with changes of mild to moderate degenerative disc disease. The spinal canal is widely patent. The prevertebral soft tissues are not thickened. Review of the axial images demonstrates mild to moderate right neural foraminal narrowing at C4-5 and C5-6 secondary to marked right facet arthrosis. Broad-based disc bulge at C6-7 may impinge the exiting nerve roots bilateral 8 at this level. Upper chest: Unremarkable Other: Extensive atherosclerotic calcification is noted within the right carotid bifurcation. IMPRESSION: No acute intracranial injury.  No calvarial fracture. No acute fracture or listhesis of the cervical spine. Multilevel degenerative disc and degenerative joint disease resulting in multilevel neural foraminal narrowing as described above. Prominent right carotid bifurcation calcifications. The degree of stenosis would be better assessed with dedicated carotid artery Doppler sonography, if indicated. Electronically Signed   By: Fidela Salisbury MD   On: 12/22/2019 06:40   DG Pelvis Portable  Result Date: 12/22/2019 CLINICAL DATA:  Level 2 fall EXAM: PORTABLE PELVIS 1-2 VIEWS COMPARISON:  None. FINDINGS: There is no evidence of pelvic fracture or diastasis. No pelvic bone lesions are seen. IMPRESSION: Negative. Electronically Signed   By: Monte Fantasia M.D.   On: 12/22/2019 05:45    Procedures Procedures  CRITICAL CARE Performed by: Delora Fuel Total critical care time: 35 minutes Critical care time was exclusive of separately billable procedures and treating other patients. Critical care was necessary to treat or prevent imminent or life-threatening deterioration. Critical care was time spent personally by me on the following activities: development of treatment plan with patient and/or surrogate as well as nursing, discussions with consultants, evaluation of patient's response to treatment, examination of patient, obtaining history from patient or  surrogate, ordering and performing treatments and interventions, ordering and review of laboratory studies, ordering and review of radiographic studies, pulse oximetry and re-evaluation of patient's condition.  Medications Ordered in ED Medications - No data to display  ED Course  I have reviewed the triage vital signs and the nursing notes.  Pertinent labs & imaging results that were available during my care of the patient were reviewed by me and considered in my medical decision making (see chart for details).  MDM Rules/Calculators/A&P Fall with head injury while taking anticoagulants.  She will be sent for CT of head and cervical spine.  Also, plain x-rays of pelvis are obtained.  She has no prior records in the [facility] health system.  X-rays and CT scans show no acute injury.  She started complaining of pain in her knee and x-ray was obtained which also showed no acute injury.  She is discharged back to her skilled care facility.  Final Clinical Impression(s) / ED Diagnoses Final diagnoses:  Fall  Fall from bed, initial encounter  Laceration of scalp, initial encounter  Chronic anticoagulation    Rx / DC Orders ED Discharge Orders    None       Delora Fuel, MD 58/09/98 [phone]

## 2019-12-22 NOTE — ED Notes (Signed)
Pt bib ems for fall on thinners. Lac present and bleeding controlled. Per ems patient is from [facility] and rolled out of bed and hit her head on the bedside table. Patient is co of right knee pain, cervical tenderness, posterior head pain, and pelvic pain. Patient is A&Ox4 and nad. Patient vss. Family at bedside.

## 2019-12-22 NOTE — ED Notes (Signed)
Called [facility] (afterhours) and on the way

## 2019-12-22 NOTE — Discharge Instructions (Addendum)
Apply ice to any sore areas.  Take acetaminophen as needed for pain.  Continue with your physical therapy.

## 2019-12-22 NOTE — Progress Notes (Signed)
   12/22/19 0530  Clinical Encounter Type  Visit Type Trauma  Referral From Other (Comment) (Page)  Consult/Referral To Chaplain  Chaplain responded to page.  Daughter is present and Chaplain was not needed.  Chaplain Hikeem Andersson [place] [phone]

## 2019-12-22 NOTE — ED Notes (Signed)
Pt returned from CT and radiology

## 2019-12-22 NOTE — ED Notes (Signed)
Pts daughter at bedside, requests we not give patient narcotics d/t delirium

## 2019-12-22 NOTE — ED Notes (Signed)
[facility] arrived.  

## 2019-12-22 NOTE — ED Notes (Signed)
Per pt family no narcotics because patient starts to hallucinate.

## 2019-12-24 DIAGNOSIS — R42 Dizziness and giddiness: Secondary | ICD-10-CM | POA: Diagnosis not present

## 2019-12-24 DIAGNOSIS — H6690 Otitis media, unspecified, unspecified ear: Secondary | ICD-10-CM | POA: Diagnosis not present

## 2019-12-24 DIAGNOSIS — R600 Localized edema: Secondary | ICD-10-CM | POA: Diagnosis not present

## 2019-12-24 DIAGNOSIS — R197 Diarrhea, unspecified: Secondary | ICD-10-CM | POA: Diagnosis not present

## 2019-12-29 ENCOUNTER — Other Ambulatory Visit: Payer: Self-pay

## 2019-12-29 NOTE — Patient Outreach (Signed)
  [facility]) Care Management Chronic Special Needs Program  12/29/2019  Name: Breanna Shields DOB: 1940-07-18  MRN: [id number]  [name] is enrolled in a chronic special needs plan. RNCM received notification that client discharged from skilled facility on 12/28/19. Transition of care call per [facility] general discharge. Care plan updated.    Goals Addressed            This Visit's Progress   . General - Client will not be readmitted within 30 days (C-SNP)discharged rehab 12/28/19   On track    Client admitted on 10/24/19 for T9 vertebral fracture from fall, THORACIC SEVEN-THORACIC ELEVEN PERCUTANEOUS PEDICLE SCREW PLACEMENT on 10/26/19.  Discharged to skilled nursing facility [facility] on 11/04/19. Per records-Discharged from rehab on 12/28/19 Transition of care care by [place] discharge Please follow discharge instructions and call provider if you have any questions. Please attend all follow up appointments as scheduled. Please take your medications as prescribed. Please call 24 Hour nurse advice line as needed [phone] Continue fall prevention strategies as recommended previously.        Plan: RNCM will follow up on any emmi red flags as indicated. update assigned RNCM, Melissa Sandlin. Updated care plan sent to primary care provider. Updated care plan sent to Client. Next outreach per assigned RNCM next month or sooner as indicated.    Thea Silversmith, RN, MSN, [place] [phone]

## 2019-12-31 DIAGNOSIS — S22070D Wedge compression fracture of T9-T10 vertebra, subsequent encounter for fracture with routine healing: Secondary | ICD-10-CM | POA: Diagnosis not present

## 2019-12-31 DIAGNOSIS — R0902 Hypoxemia: Secondary | ICD-10-CM | POA: Diagnosis not present

## 2019-12-31 DIAGNOSIS — R2689 Other abnormalities of gait and mobility: Secondary | ICD-10-CM | POA: Diagnosis not present

## 2019-12-31 DIAGNOSIS — Z9981 Dependence on supplemental oxygen: Secondary | ICD-10-CM | POA: Diagnosis not present

## 2019-12-31 DIAGNOSIS — I10 Essential (primary) hypertension: Secondary | ICD-10-CM | POA: Diagnosis not present

## 2019-12-31 DIAGNOSIS — I4891 Unspecified atrial fibrillation: Secondary | ICD-10-CM | POA: Diagnosis not present

## 2019-12-31 DIAGNOSIS — Z9181 History of falling: Secondary | ICD-10-CM | POA: Diagnosis not present

## 2020-01-06 DIAGNOSIS — Z96653 Presence of artificial knee joint, bilateral: Secondary | ICD-10-CM | POA: Diagnosis not present

## 2020-01-06 DIAGNOSIS — D649 Anemia, unspecified: Secondary | ICD-10-CM | POA: Diagnosis not present

## 2020-01-06 DIAGNOSIS — Z87891 Personal history of nicotine dependence: Secondary | ICD-10-CM | POA: Diagnosis not present

## 2020-01-06 DIAGNOSIS — Z87440 Personal history of urinary (tract) infections: Secondary | ICD-10-CM | POA: Diagnosis not present

## 2020-01-06 DIAGNOSIS — R4182 Altered mental status, unspecified: Secondary | ICD-10-CM | POA: Diagnosis not present

## 2020-01-06 DIAGNOSIS — F419 Anxiety disorder, unspecified: Secondary | ICD-10-CM | POA: Diagnosis not present

## 2020-01-06 DIAGNOSIS — Z9181 History of falling: Secondary | ICD-10-CM | POA: Diagnosis not present

## 2020-01-06 DIAGNOSIS — I10 Essential (primary) hypertension: Secondary | ICD-10-CM | POA: Diagnosis not present

## 2020-01-06 DIAGNOSIS — M1991 Primary osteoarthritis, unspecified site: Secondary | ICD-10-CM | POA: Diagnosis not present

## 2020-01-06 DIAGNOSIS — J9610 Chronic respiratory failure, unspecified whether with hypoxia or hypercapnia: Secondary | ICD-10-CM | POA: Diagnosis not present

## 2020-01-06 DIAGNOSIS — R441 Visual hallucinations: Secondary | ICD-10-CM | POA: Diagnosis not present

## 2020-01-06 DIAGNOSIS — E559 Vitamin D deficiency, unspecified: Secondary | ICD-10-CM | POA: Diagnosis not present

## 2020-01-06 DIAGNOSIS — R339 Retention of urine, unspecified: Secondary | ICD-10-CM | POA: Diagnosis not present

## 2020-01-06 DIAGNOSIS — E785 Hyperlipidemia, unspecified: Secondary | ICD-10-CM | POA: Diagnosis not present

## 2020-01-06 DIAGNOSIS — Z794 Long term (current) use of insulin: Secondary | ICD-10-CM | POA: Diagnosis not present

## 2020-01-06 DIAGNOSIS — I4891 Unspecified atrial fibrillation: Secondary | ICD-10-CM | POA: Diagnosis not present

## 2020-01-06 DIAGNOSIS — Z7984 Long term (current) use of oral hypoglycemic drugs: Secondary | ICD-10-CM | POA: Diagnosis not present

## 2020-01-06 DIAGNOSIS — S22078D Other fracture of T9-T10 vertebra, subsequent encounter for fracture with routine healing: Secondary | ICD-10-CM | POA: Diagnosis not present

## 2020-01-06 DIAGNOSIS — G894 Chronic pain syndrome: Secondary | ICD-10-CM | POA: Diagnosis not present

## 2020-01-06 DIAGNOSIS — E119 Type 2 diabetes mellitus without complications: Secondary | ICD-10-CM | POA: Diagnosis not present

## 2020-01-06 DIAGNOSIS — F32A Depression, unspecified: Secondary | ICD-10-CM | POA: Diagnosis not present

## 2020-01-12 DIAGNOSIS — S22070D Wedge compression fracture of T9-T10 vertebra, subsequent encounter for fracture with routine healing: Secondary | ICD-10-CM | POA: Diagnosis not present

## 2020-01-15 ENCOUNTER — Other Ambulatory Visit: Payer: Self-pay

## 2020-01-15 ENCOUNTER — Ambulatory Visit: Payer: HMO

## 2020-01-15 DIAGNOSIS — S22078D Other fracture of T9-T10 vertebra, subsequent encounter for fracture with routine healing: Secondary | ICD-10-CM | POA: Diagnosis not present

## 2020-01-15 DIAGNOSIS — Z9181 History of falling: Secondary | ICD-10-CM | POA: Diagnosis not present

## 2020-01-15 DIAGNOSIS — I10 Essential (primary) hypertension: Secondary | ICD-10-CM | POA: Diagnosis not present

## 2020-01-15 DIAGNOSIS — I4891 Unspecified atrial fibrillation: Secondary | ICD-10-CM | POA: Diagnosis not present

## 2020-01-15 DIAGNOSIS — Z794 Long term (current) use of insulin: Secondary | ICD-10-CM | POA: Diagnosis not present

## 2020-01-15 DIAGNOSIS — Z87891 Personal history of nicotine dependence: Secondary | ICD-10-CM | POA: Diagnosis not present

## 2020-01-15 DIAGNOSIS — D649 Anemia, unspecified: Secondary | ICD-10-CM | POA: Diagnosis not present

## 2020-01-15 DIAGNOSIS — F32A Depression, unspecified: Secondary | ICD-10-CM | POA: Diagnosis not present

## 2020-01-15 DIAGNOSIS — F419 Anxiety disorder, unspecified: Secondary | ICD-10-CM | POA: Diagnosis not present

## 2020-01-15 DIAGNOSIS — R4182 Altered mental status, unspecified: Secondary | ICD-10-CM | POA: Diagnosis not present

## 2020-01-15 DIAGNOSIS — R339 Retention of urine, unspecified: Secondary | ICD-10-CM | POA: Diagnosis not present

## 2020-01-15 DIAGNOSIS — E785 Hyperlipidemia, unspecified: Secondary | ICD-10-CM | POA: Diagnosis not present

## 2020-01-15 DIAGNOSIS — E559 Vitamin D deficiency, unspecified: Secondary | ICD-10-CM | POA: Diagnosis not present

## 2020-01-15 DIAGNOSIS — E119 Type 2 diabetes mellitus without complications: Secondary | ICD-10-CM | POA: Diagnosis not present

## 2020-01-15 DIAGNOSIS — M1991 Primary osteoarthritis, unspecified site: Secondary | ICD-10-CM | POA: Diagnosis not present

## 2020-01-15 DIAGNOSIS — G894 Chronic pain syndrome: Secondary | ICD-10-CM | POA: Diagnosis not present

## 2020-01-15 DIAGNOSIS — Z96653 Presence of artificial knee joint, bilateral: Secondary | ICD-10-CM | POA: Diagnosis not present

## 2020-01-15 DIAGNOSIS — Z7984 Long term (current) use of oral hypoglycemic drugs: Secondary | ICD-10-CM | POA: Diagnosis not present

## 2020-01-15 DIAGNOSIS — J9610 Chronic respiratory failure, unspecified whether with hypoxia or hypercapnia: Secondary | ICD-10-CM | POA: Diagnosis not present

## 2020-01-15 DIAGNOSIS — Z87440 Personal history of urinary (tract) infections: Secondary | ICD-10-CM | POA: Diagnosis not present

## 2020-01-15 DIAGNOSIS — R441 Visual hallucinations: Secondary | ICD-10-CM | POA: Diagnosis not present

## 2020-01-15 NOTE — Patient Outreach (Signed)
  [facility]) Care Management Chronic Special Needs Program    01/15/2020  Name: Breanna Shields, DOB: 08-28-1940  MRN: [id number]   [name] is enrolled in a chronic special needs plan for Diabetes.   [employer] Care Management Team has assumed care and services for this member. Case closed by [place] RN, [facility], [place] Management [phone]

## 2020-01-19 ENCOUNTER — Ambulatory Visit: Payer: Self-pay

## 2020-01-19 DIAGNOSIS — E1165 Type 2 diabetes mellitus with hyperglycemia: Secondary | ICD-10-CM | POA: Diagnosis not present

## 2020-01-19 DIAGNOSIS — Z9181 History of falling: Secondary | ICD-10-CM | POA: Diagnosis not present

## 2020-01-19 DIAGNOSIS — E785 Hyperlipidemia, unspecified: Secondary | ICD-10-CM | POA: Diagnosis not present

## 2020-01-19 DIAGNOSIS — I10 Essential (primary) hypertension: Secondary | ICD-10-CM | POA: Diagnosis not present

## 2020-01-19 DIAGNOSIS — Z79899 Other long term (current) drug therapy: Secondary | ICD-10-CM | POA: Diagnosis not present

## 2020-01-19 DIAGNOSIS — E559 Vitamin D deficiency, unspecified: Secondary | ICD-10-CM | POA: Diagnosis not present

## 2020-01-30 DIAGNOSIS — I4891 Unspecified atrial fibrillation: Secondary | ICD-10-CM | POA: Diagnosis not present

## 2020-01-30 DIAGNOSIS — Z9981 Dependence on supplemental oxygen: Secondary | ICD-10-CM | POA: Diagnosis not present

## 2020-01-30 DIAGNOSIS — R0902 Hypoxemia: Secondary | ICD-10-CM | POA: Diagnosis not present

## 2020-01-30 DIAGNOSIS — R2689 Other abnormalities of gait and mobility: Secondary | ICD-10-CM | POA: Diagnosis not present

## 2020-01-30 DIAGNOSIS — S22070D Wedge compression fracture of T9-T10 vertebra, subsequent encounter for fracture with routine healing: Secondary | ICD-10-CM | POA: Diagnosis not present

## 2020-01-30 DIAGNOSIS — I10 Essential (primary) hypertension: Secondary | ICD-10-CM | POA: Diagnosis not present

## 2020-01-30 DIAGNOSIS — Z9181 History of falling: Secondary | ICD-10-CM | POA: Diagnosis not present

## 2020-02-12 DIAGNOSIS — F32A Depression, unspecified: Secondary | ICD-10-CM | POA: Diagnosis not present

## 2020-02-12 DIAGNOSIS — D649 Anemia, unspecified: Secondary | ICD-10-CM | POA: Diagnosis not present

## 2020-02-12 DIAGNOSIS — F419 Anxiety disorder, unspecified: Secondary | ICD-10-CM | POA: Diagnosis not present

## 2020-02-12 DIAGNOSIS — I10 Essential (primary) hypertension: Secondary | ICD-10-CM | POA: Diagnosis not present

## 2020-02-12 DIAGNOSIS — Z96653 Presence of artificial knee joint, bilateral: Secondary | ICD-10-CM | POA: Diagnosis not present

## 2020-02-12 DIAGNOSIS — Z9181 History of falling: Secondary | ICD-10-CM | POA: Diagnosis not present

## 2020-02-12 DIAGNOSIS — Z87440 Personal history of urinary (tract) infections: Secondary | ICD-10-CM | POA: Diagnosis not present

## 2020-02-12 DIAGNOSIS — E559 Vitamin D deficiency, unspecified: Secondary | ICD-10-CM | POA: Diagnosis not present

## 2020-02-12 DIAGNOSIS — Z87891 Personal history of nicotine dependence: Secondary | ICD-10-CM | POA: Diagnosis not present

## 2020-02-12 DIAGNOSIS — M1991 Primary osteoarthritis, unspecified site: Secondary | ICD-10-CM | POA: Diagnosis not present

## 2020-02-12 DIAGNOSIS — S22078D Other fracture of T9-T10 vertebra, subsequent encounter for fracture with routine healing: Secondary | ICD-10-CM | POA: Diagnosis not present

## 2020-02-12 DIAGNOSIS — Z794 Long term (current) use of insulin: Secondary | ICD-10-CM | POA: Diagnosis not present

## 2020-02-12 DIAGNOSIS — G894 Chronic pain syndrome: Secondary | ICD-10-CM | POA: Diagnosis not present

## 2020-02-12 DIAGNOSIS — J9610 Chronic respiratory failure, unspecified whether with hypoxia or hypercapnia: Secondary | ICD-10-CM | POA: Diagnosis not present

## 2020-02-12 DIAGNOSIS — I4891 Unspecified atrial fibrillation: Secondary | ICD-10-CM | POA: Diagnosis not present

## 2020-02-12 DIAGNOSIS — R4182 Altered mental status, unspecified: Secondary | ICD-10-CM | POA: Diagnosis not present

## 2020-02-12 DIAGNOSIS — Z7984 Long term (current) use of oral hypoglycemic drugs: Secondary | ICD-10-CM | POA: Diagnosis not present

## 2020-02-12 DIAGNOSIS — E785 Hyperlipidemia, unspecified: Secondary | ICD-10-CM | POA: Diagnosis not present

## 2020-02-12 DIAGNOSIS — R441 Visual hallucinations: Secondary | ICD-10-CM | POA: Diagnosis not present

## 2020-02-12 DIAGNOSIS — R339 Retention of urine, unspecified: Secondary | ICD-10-CM | POA: Diagnosis not present

## 2020-02-12 DIAGNOSIS — E119 Type 2 diabetes mellitus without complications: Secondary | ICD-10-CM | POA: Diagnosis not present

## 2020-02-17 ENCOUNTER — Ambulatory Visit: Payer: HMO | Admitting: Neurology

## 2020-02-17 DIAGNOSIS — H04123 Dry eye syndrome of bilateral lacrimal glands: Secondary | ICD-10-CM | POA: Diagnosis not present

## 2020-02-17 DIAGNOSIS — H0102A Squamous blepharitis right eye, upper and lower eyelids: Secondary | ICD-10-CM | POA: Diagnosis not present

## 2020-02-17 DIAGNOSIS — H40013 Open angle with borderline findings, low risk, bilateral: Secondary | ICD-10-CM | POA: Diagnosis not present

## 2020-02-17 DIAGNOSIS — H02834 Dermatochalasis of left upper eyelid: Secondary | ICD-10-CM | POA: Diagnosis not present

## 2020-02-17 DIAGNOSIS — H0102B Squamous blepharitis left eye, upper and lower eyelids: Secondary | ICD-10-CM | POA: Diagnosis not present

## 2020-02-17 DIAGNOSIS — E119 Type 2 diabetes mellitus without complications: Secondary | ICD-10-CM | POA: Diagnosis not present

## 2020-02-17 DIAGNOSIS — Z961 Presence of intraocular lens: Secondary | ICD-10-CM | POA: Diagnosis not present

## 2020-02-17 DIAGNOSIS — H10413 Chronic giant papillary conjunctivitis, bilateral: Secondary | ICD-10-CM | POA: Diagnosis not present

## 2020-02-17 DIAGNOSIS — H02831 Dermatochalasis of right upper eyelid: Secondary | ICD-10-CM | POA: Diagnosis not present

## 2020-02-25 DIAGNOSIS — R2689 Other abnormalities of gait and mobility: Secondary | ICD-10-CM | POA: Diagnosis not present

## 2020-02-25 DIAGNOSIS — I10 Essential (primary) hypertension: Secondary | ICD-10-CM | POA: Diagnosis not present

## 2020-02-25 DIAGNOSIS — S22070D Wedge compression fracture of T9-T10 vertebra, subsequent encounter for fracture with routine healing: Secondary | ICD-10-CM | POA: Diagnosis not present

## 2020-02-25 DIAGNOSIS — Z9181 History of falling: Secondary | ICD-10-CM | POA: Diagnosis not present

## 2020-02-25 DIAGNOSIS — R0902 Hypoxemia: Secondary | ICD-10-CM | POA: Diagnosis not present

## 2020-02-25 DIAGNOSIS — I4891 Unspecified atrial fibrillation: Secondary | ICD-10-CM | POA: Diagnosis not present

## 2020-02-25 DIAGNOSIS — J9610 Chronic respiratory failure, unspecified whether with hypoxia or hypercapnia: Secondary | ICD-10-CM | POA: Diagnosis not present

## 2020-02-25 DIAGNOSIS — Z9981 Dependence on supplemental oxygen: Secondary | ICD-10-CM | POA: Diagnosis not present

## 2020-03-01 DIAGNOSIS — Z9181 History of falling: Secondary | ICD-10-CM | POA: Diagnosis not present

## 2020-03-01 DIAGNOSIS — R0902 Hypoxemia: Secondary | ICD-10-CM | POA: Diagnosis not present

## 2020-03-01 DIAGNOSIS — S22070D Wedge compression fracture of T9-T10 vertebra, subsequent encounter for fracture with routine healing: Secondary | ICD-10-CM | POA: Diagnosis not present

## 2020-03-01 DIAGNOSIS — I10 Essential (primary) hypertension: Secondary | ICD-10-CM | POA: Diagnosis not present

## 2020-03-01 DIAGNOSIS — R2689 Other abnormalities of gait and mobility: Secondary | ICD-10-CM | POA: Diagnosis not present

## 2020-03-01 DIAGNOSIS — Z9981 Dependence on supplemental oxygen: Secondary | ICD-10-CM | POA: Diagnosis not present

## 2020-03-01 DIAGNOSIS — I4891 Unspecified atrial fibrillation: Secondary | ICD-10-CM | POA: Diagnosis not present

## 2020-03-22 DIAGNOSIS — S22070D Wedge compression fracture of T9-T10 vertebra, subsequent encounter for fracture with routine healing: Secondary | ICD-10-CM | POA: Diagnosis not present

## 2020-03-22 DIAGNOSIS — G44321 Chronic post-traumatic headache, intractable: Secondary | ICD-10-CM | POA: Diagnosis not present

## 2020-03-24 ENCOUNTER — Other Ambulatory Visit: Payer: Self-pay | Admitting: Neurosurgery

## 2020-03-24 DIAGNOSIS — G44321 Chronic post-traumatic headache, intractable: Secondary | ICD-10-CM

## 2020-03-25 DIAGNOSIS — Z23 Encounter for immunization: Secondary | ICD-10-CM | POA: Diagnosis not present

## 2020-03-25 DIAGNOSIS — I10 Essential (primary) hypertension: Secondary | ICD-10-CM | POA: Diagnosis not present

## 2020-03-25 DIAGNOSIS — E782 Mixed hyperlipidemia: Secondary | ICD-10-CM | POA: Diagnosis not present

## 2020-03-25 DIAGNOSIS — E1165 Type 2 diabetes mellitus with hyperglycemia: Secondary | ICD-10-CM | POA: Diagnosis not present

## 2020-03-25 DIAGNOSIS — Z87891 Personal history of nicotine dependence: Secondary | ICD-10-CM | POA: Diagnosis not present

## 2020-03-25 DIAGNOSIS — Z6841 Body Mass Index (BMI) 40.0 and over, adult: Secondary | ICD-10-CM | POA: Diagnosis not present

## 2020-03-25 DIAGNOSIS — H8110 Benign paroxysmal vertigo, unspecified ear: Secondary | ICD-10-CM | POA: Diagnosis not present

## 2020-03-25 DIAGNOSIS — E559 Vitamin D deficiency, unspecified: Secondary | ICD-10-CM | POA: Diagnosis not present

## 2020-03-27 DIAGNOSIS — Z9981 Dependence on supplemental oxygen: Secondary | ICD-10-CM | POA: Diagnosis not present

## 2020-03-27 DIAGNOSIS — R2689 Other abnormalities of gait and mobility: Secondary | ICD-10-CM | POA: Diagnosis not present

## 2020-03-27 DIAGNOSIS — J9610 Chronic respiratory failure, unspecified whether with hypoxia or hypercapnia: Secondary | ICD-10-CM | POA: Diagnosis not present

## 2020-03-27 DIAGNOSIS — S22070D Wedge compression fracture of T9-T10 vertebra, subsequent encounter for fracture with routine healing: Secondary | ICD-10-CM | POA: Diagnosis not present

## 2020-03-27 DIAGNOSIS — Z9181 History of falling: Secondary | ICD-10-CM | POA: Diagnosis not present

## 2020-03-27 DIAGNOSIS — I4891 Unspecified atrial fibrillation: Secondary | ICD-10-CM | POA: Diagnosis not present

## 2020-03-27 DIAGNOSIS — R0902 Hypoxemia: Secondary | ICD-10-CM | POA: Diagnosis not present

## 2020-03-27 DIAGNOSIS — I10 Essential (primary) hypertension: Secondary | ICD-10-CM | POA: Diagnosis not present

## 2020-04-24 DIAGNOSIS — R2689 Other abnormalities of gait and mobility: Secondary | ICD-10-CM | POA: Diagnosis not present

## 2020-04-24 DIAGNOSIS — J9610 Chronic respiratory failure, unspecified whether with hypoxia or hypercapnia: Secondary | ICD-10-CM | POA: Diagnosis not present

## 2020-04-24 DIAGNOSIS — I10 Essential (primary) hypertension: Secondary | ICD-10-CM | POA: Diagnosis not present

## 2020-04-24 DIAGNOSIS — S22070D Wedge compression fracture of T9-T10 vertebra, subsequent encounter for fracture with routine healing: Secondary | ICD-10-CM | POA: Diagnosis not present

## 2020-04-24 DIAGNOSIS — Z9981 Dependence on supplemental oxygen: Secondary | ICD-10-CM | POA: Diagnosis not present

## 2020-04-24 DIAGNOSIS — R0902 Hypoxemia: Secondary | ICD-10-CM | POA: Diagnosis not present

## 2020-04-24 DIAGNOSIS — I4891 Unspecified atrial fibrillation: Secondary | ICD-10-CM | POA: Diagnosis not present

## 2020-04-24 DIAGNOSIS — Z9181 History of falling: Secondary | ICD-10-CM | POA: Diagnosis not present

## 2020-05-04 DIAGNOSIS — E785 Hyperlipidemia, unspecified: Secondary | ICD-10-CM | POA: Diagnosis not present

## 2020-05-04 DIAGNOSIS — E119 Type 2 diabetes mellitus without complications: Secondary | ICD-10-CM | POA: Diagnosis not present

## 2020-05-04 DIAGNOSIS — S22009D Unspecified fracture of unspecified thoracic vertebra, subsequent encounter for fracture with routine healing: Secondary | ICD-10-CM | POA: Diagnosis not present

## 2020-05-04 DIAGNOSIS — Z9181 History of falling: Secondary | ICD-10-CM | POA: Diagnosis not present

## 2020-05-04 DIAGNOSIS — F324 Major depressive disorder, single episode, in partial remission: Secondary | ICD-10-CM | POA: Diagnosis not present

## 2020-05-04 DIAGNOSIS — M810 Age-related osteoporosis without current pathological fracture: Secondary | ICD-10-CM | POA: Diagnosis not present

## 2020-05-04 DIAGNOSIS — I700 Atherosclerosis of aorta: Secondary | ICD-10-CM | POA: Diagnosis not present

## 2020-05-04 DIAGNOSIS — M1990 Unspecified osteoarthritis, unspecified site: Secondary | ICD-10-CM | POA: Diagnosis not present

## 2020-05-04 DIAGNOSIS — D649 Anemia, unspecified: Secondary | ICD-10-CM | POA: Diagnosis not present

## 2020-05-04 DIAGNOSIS — R627 Adult failure to thrive: Secondary | ICD-10-CM | POA: Diagnosis not present

## 2020-05-04 DIAGNOSIS — I10 Essential (primary) hypertension: Secondary | ICD-10-CM | POA: Diagnosis not present

## 2020-05-12 ENCOUNTER — Ambulatory Visit
Admission: RE | Admit: 2020-05-12 | Discharge: 2020-05-12 | Disposition: A | Discharge status: Home / Self Care | Payer: HMO | Source: Ambulatory Visit | Attending: Neurosurgery | Admitting: Neurosurgery

## 2020-05-12 DIAGNOSIS — R519 Headache, unspecified: Secondary | ICD-10-CM | POA: Diagnosis not present

## 2020-05-12 DIAGNOSIS — G44321 Chronic post-traumatic headache, intractable: Secondary | ICD-10-CM

## 2020-05-17 ENCOUNTER — Ambulatory Visit: Payer: HMO

## 2020-05-25 DIAGNOSIS — I10 Essential (primary) hypertension: Secondary | ICD-10-CM | POA: Diagnosis not present

## 2020-05-25 DIAGNOSIS — S22070D Wedge compression fracture of T9-T10 vertebra, subsequent encounter for fracture with routine healing: Secondary | ICD-10-CM | POA: Diagnosis not present

## 2020-05-25 DIAGNOSIS — I4891 Unspecified atrial fibrillation: Secondary | ICD-10-CM | POA: Diagnosis not present

## 2020-05-25 DIAGNOSIS — R0902 Hypoxemia: Secondary | ICD-10-CM | POA: Diagnosis not present

## 2020-05-25 DIAGNOSIS — R2689 Other abnormalities of gait and mobility: Secondary | ICD-10-CM | POA: Diagnosis not present

## 2020-05-25 DIAGNOSIS — Z9981 Dependence on supplemental oxygen: Secondary | ICD-10-CM | POA: Diagnosis not present

## 2020-05-25 DIAGNOSIS — Z9181 History of falling: Secondary | ICD-10-CM | POA: Diagnosis not present

## 2020-05-25 DIAGNOSIS — J9610 Chronic respiratory failure, unspecified whether with hypoxia or hypercapnia: Secondary | ICD-10-CM | POA: Diagnosis not present

## 2020-08-06 DIAGNOSIS — E785 Hyperlipidemia, unspecified: Secondary | ICD-10-CM | POA: Diagnosis not present

## 2020-08-06 DIAGNOSIS — D649 Anemia, unspecified: Secondary | ICD-10-CM | POA: Diagnosis not present

## 2020-08-06 DIAGNOSIS — I700 Atherosclerosis of aorta: Secondary | ICD-10-CM | POA: Diagnosis not present

## 2020-08-06 DIAGNOSIS — R627 Adult failure to thrive: Secondary | ICD-10-CM | POA: Diagnosis not present

## 2020-08-06 DIAGNOSIS — I10 Essential (primary) hypertension: Secondary | ICD-10-CM | POA: Diagnosis not present

## 2020-08-06 DIAGNOSIS — Z9181 History of falling: Secondary | ICD-10-CM | POA: Diagnosis not present

## 2020-08-06 DIAGNOSIS — M1990 Unspecified osteoarthritis, unspecified site: Secondary | ICD-10-CM | POA: Diagnosis not present

## 2020-08-06 DIAGNOSIS — I5189 Other ill-defined heart diseases: Secondary | ICD-10-CM | POA: Diagnosis not present

## 2020-08-06 DIAGNOSIS — F324 Major depressive disorder, single episode, in partial remission: Secondary | ICD-10-CM | POA: Diagnosis not present

## 2020-08-06 DIAGNOSIS — I480 Paroxysmal atrial fibrillation: Secondary | ICD-10-CM | POA: Diagnosis not present

## 2020-08-06 DIAGNOSIS — E119 Type 2 diabetes mellitus without complications: Secondary | ICD-10-CM | POA: Diagnosis not present

## 2020-11-04 DIAGNOSIS — I700 Atherosclerosis of aorta: Secondary | ICD-10-CM | POA: Diagnosis not present

## 2020-11-04 DIAGNOSIS — M810 Age-related osteoporosis without current pathological fracture: Secondary | ICD-10-CM | POA: Diagnosis not present

## 2020-11-04 DIAGNOSIS — Z23 Encounter for immunization: Secondary | ICD-10-CM | POA: Diagnosis not present

## 2020-11-04 DIAGNOSIS — E559 Vitamin D deficiency, unspecified: Secondary | ICD-10-CM | POA: Diagnosis not present

## 2020-11-04 DIAGNOSIS — E119 Type 2 diabetes mellitus without complications: Secondary | ICD-10-CM | POA: Diagnosis not present

## 2020-11-04 DIAGNOSIS — Z9181 History of falling: Secondary | ICD-10-CM | POA: Diagnosis not present

## 2020-11-04 DIAGNOSIS — F324 Major depressive disorder, single episode, in partial remission: Secondary | ICD-10-CM | POA: Diagnosis not present

## 2020-11-04 DIAGNOSIS — E785 Hyperlipidemia, unspecified: Secondary | ICD-10-CM | POA: Diagnosis not present

## 2020-11-04 DIAGNOSIS — I480 Paroxysmal atrial fibrillation: Secondary | ICD-10-CM | POA: Diagnosis not present

## 2020-11-04 DIAGNOSIS — I10 Essential (primary) hypertension: Secondary | ICD-10-CM | POA: Diagnosis not present

## 2020-11-04 DIAGNOSIS — N3946 Mixed incontinence: Secondary | ICD-10-CM | POA: Diagnosis not present

## 2021-04-27 DIAGNOSIS — E291 Testicular hypofunction: Secondary | ICD-10-CM | POA: Diagnosis not present

## 2021-05-02 DIAGNOSIS — E785 Hyperlipidemia, unspecified: Secondary | ICD-10-CM | POA: Diagnosis not present

## 2021-05-02 DIAGNOSIS — E119 Type 2 diabetes mellitus without complications: Secondary | ICD-10-CM | POA: Diagnosis not present

## 2021-05-02 DIAGNOSIS — E559 Vitamin D deficiency, unspecified: Secondary | ICD-10-CM | POA: Diagnosis not present

## 2021-05-02 DIAGNOSIS — I10 Essential (primary) hypertension: Secondary | ICD-10-CM | POA: Diagnosis not present

## 2021-05-09 DIAGNOSIS — M810 Age-related osteoporosis without current pathological fracture: Secondary | ICD-10-CM | POA: Diagnosis not present

## 2021-05-09 DIAGNOSIS — Z0000 Encounter for general adult medical examination without abnormal findings: Secondary | ICD-10-CM | POA: Diagnosis not present

## 2021-05-09 DIAGNOSIS — I10 Essential (primary) hypertension: Secondary | ICD-10-CM | POA: Diagnosis not present

## 2021-05-09 DIAGNOSIS — Z1331 Encounter for screening for depression: Secondary | ICD-10-CM | POA: Diagnosis not present

## 2021-05-09 DIAGNOSIS — E119 Type 2 diabetes mellitus without complications: Secondary | ICD-10-CM | POA: Diagnosis not present

## 2021-05-09 DIAGNOSIS — I700 Atherosclerosis of aorta: Secondary | ICD-10-CM | POA: Diagnosis not present

## 2021-05-09 DIAGNOSIS — Z1389 Encounter for screening for other disorder: Secondary | ICD-10-CM | POA: Diagnosis not present

## 2021-05-09 DIAGNOSIS — M1990 Unspecified osteoarthritis, unspecified site: Secondary | ICD-10-CM | POA: Diagnosis not present

## 2021-05-09 DIAGNOSIS — E559 Vitamin D deficiency, unspecified: Secondary | ICD-10-CM | POA: Diagnosis not present

## 2021-05-09 DIAGNOSIS — E785 Hyperlipidemia, unspecified: Secondary | ICD-10-CM | POA: Diagnosis not present

## 2021-05-09 DIAGNOSIS — F324 Major depressive disorder, single episode, in partial remission: Secondary | ICD-10-CM | POA: Diagnosis not present

## 2021-05-09 DIAGNOSIS — N3946 Mixed incontinence: Secondary | ICD-10-CM | POA: Diagnosis not present

## 2021-05-09 DIAGNOSIS — Z9181 History of falling: Secondary | ICD-10-CM | POA: Diagnosis not present

## 2021-05-17 DIAGNOSIS — I10 Essential (primary) hypertension: Secondary | ICD-10-CM | POA: Diagnosis not present

## 2021-05-17 DIAGNOSIS — R82998 Other abnormal findings in urine: Secondary | ICD-10-CM | POA: Diagnosis not present

## 2021-05-17 DIAGNOSIS — Z1212 Encounter for screening for malignant neoplasm of rectum: Secondary | ICD-10-CM | POA: Diagnosis not present

## 2021-05-19 DIAGNOSIS — H10413 Chronic giant papillary conjunctivitis, bilateral: Secondary | ICD-10-CM | POA: Diagnosis not present

## 2021-05-19 DIAGNOSIS — H02834 Dermatochalasis of left upper eyelid: Secondary | ICD-10-CM | POA: Diagnosis not present

## 2021-05-19 DIAGNOSIS — H02831 Dermatochalasis of right upper eyelid: Secondary | ICD-10-CM | POA: Diagnosis not present

## 2021-05-19 DIAGNOSIS — H0102A Squamous blepharitis right eye, upper and lower eyelids: Secondary | ICD-10-CM | POA: Diagnosis not present

## 2021-05-19 DIAGNOSIS — H40013 Open angle with borderline findings, low risk, bilateral: Secondary | ICD-10-CM | POA: Diagnosis not present

## 2021-05-19 DIAGNOSIS — Z961 Presence of intraocular lens: Secondary | ICD-10-CM | POA: Diagnosis not present

## 2021-05-19 DIAGNOSIS — H04123 Dry eye syndrome of bilateral lacrimal glands: Secondary | ICD-10-CM | POA: Diagnosis not present

## 2021-05-19 DIAGNOSIS — E119 Type 2 diabetes mellitus without complications: Secondary | ICD-10-CM | POA: Diagnosis not present

## 2021-05-19 DIAGNOSIS — H0102B Squamous blepharitis left eye, upper and lower eyelids: Secondary | ICD-10-CM | POA: Diagnosis not present

## 2021-09-22 IMAGING — CR DG LUMBAR SPINE COMPLETE 4+V
5 series · 5 of 5 positions shown · non-contrast
Comparison: None.

CLINICAL DATA: Fall, back

EXAM:
LUMBAR SPINE - COMPLETE 4+ VIEW

[l-spine ap]
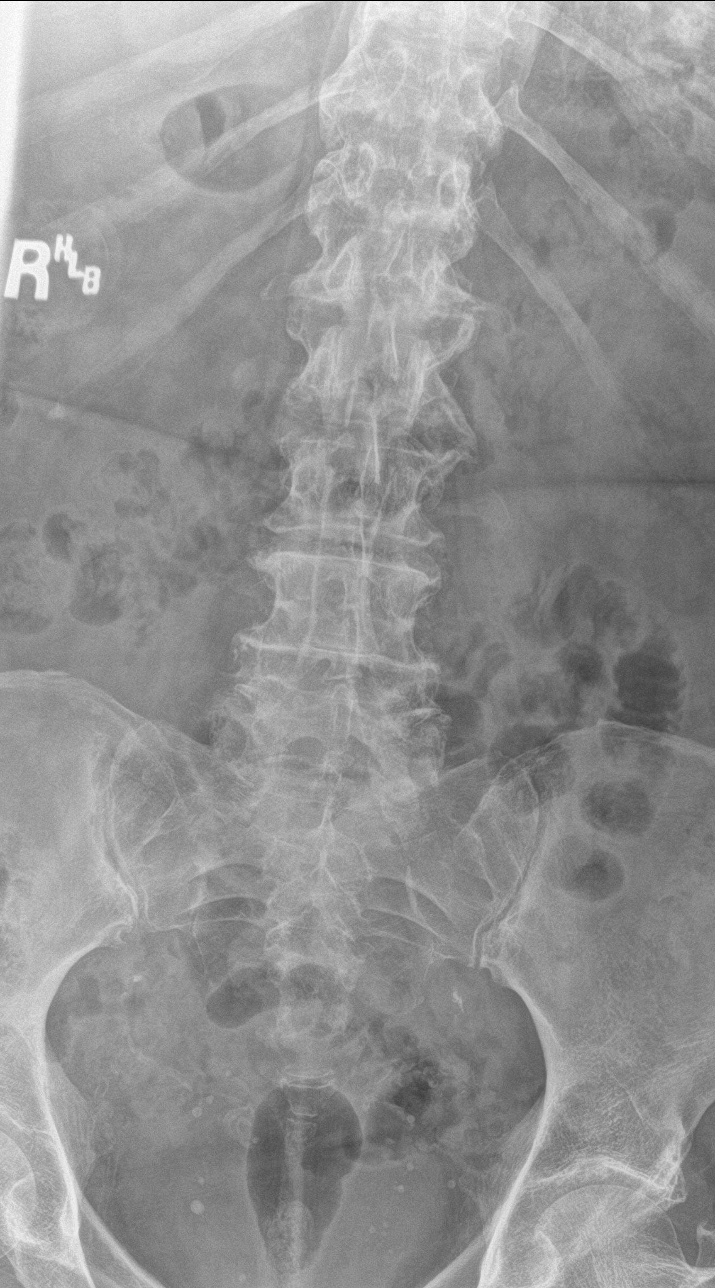

[l-spine obl (1 of 2)]
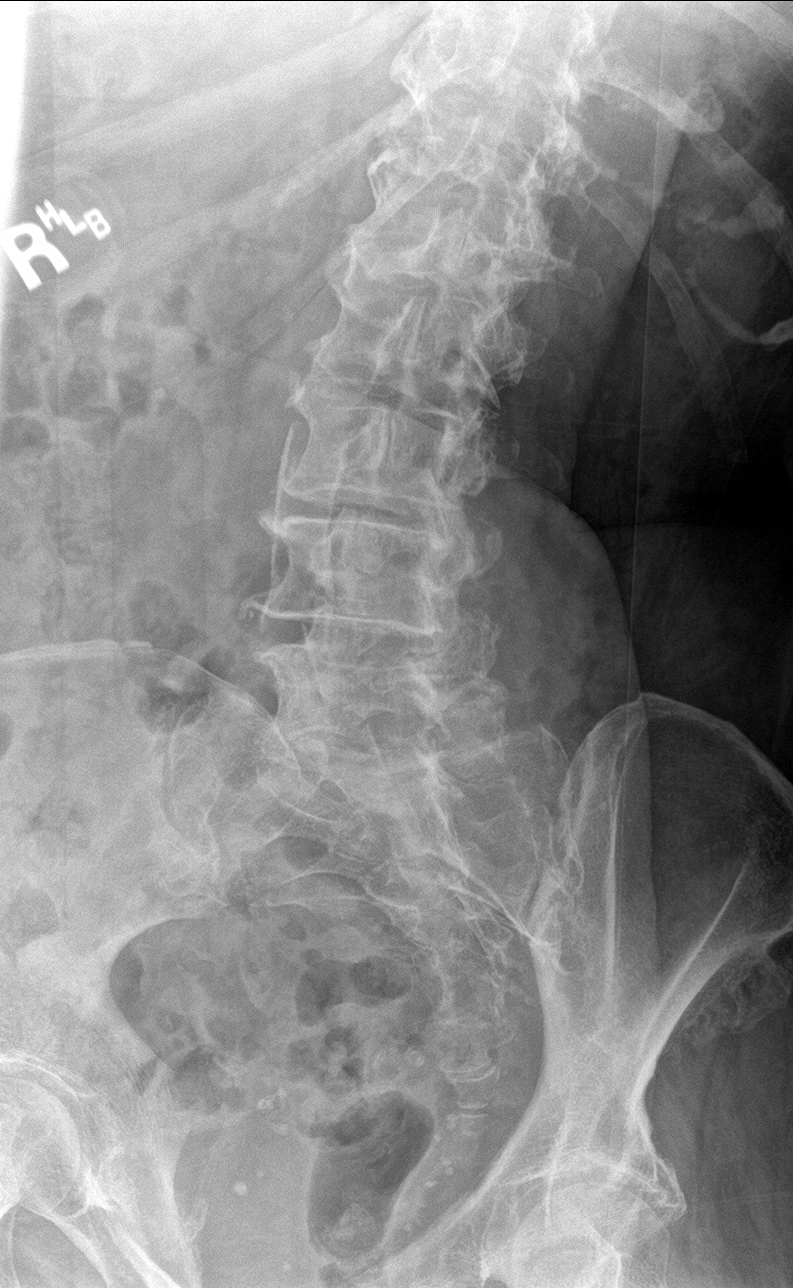

[l-spine obl (2 of 2)]
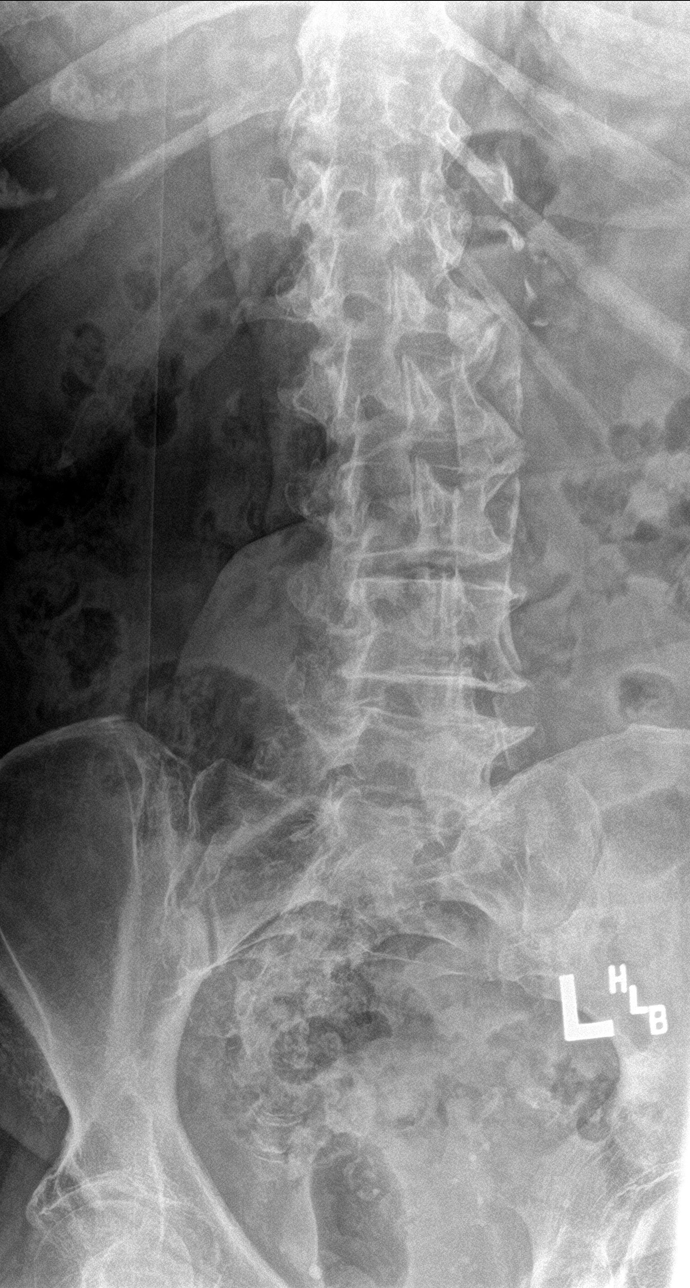

[l-spine lat]
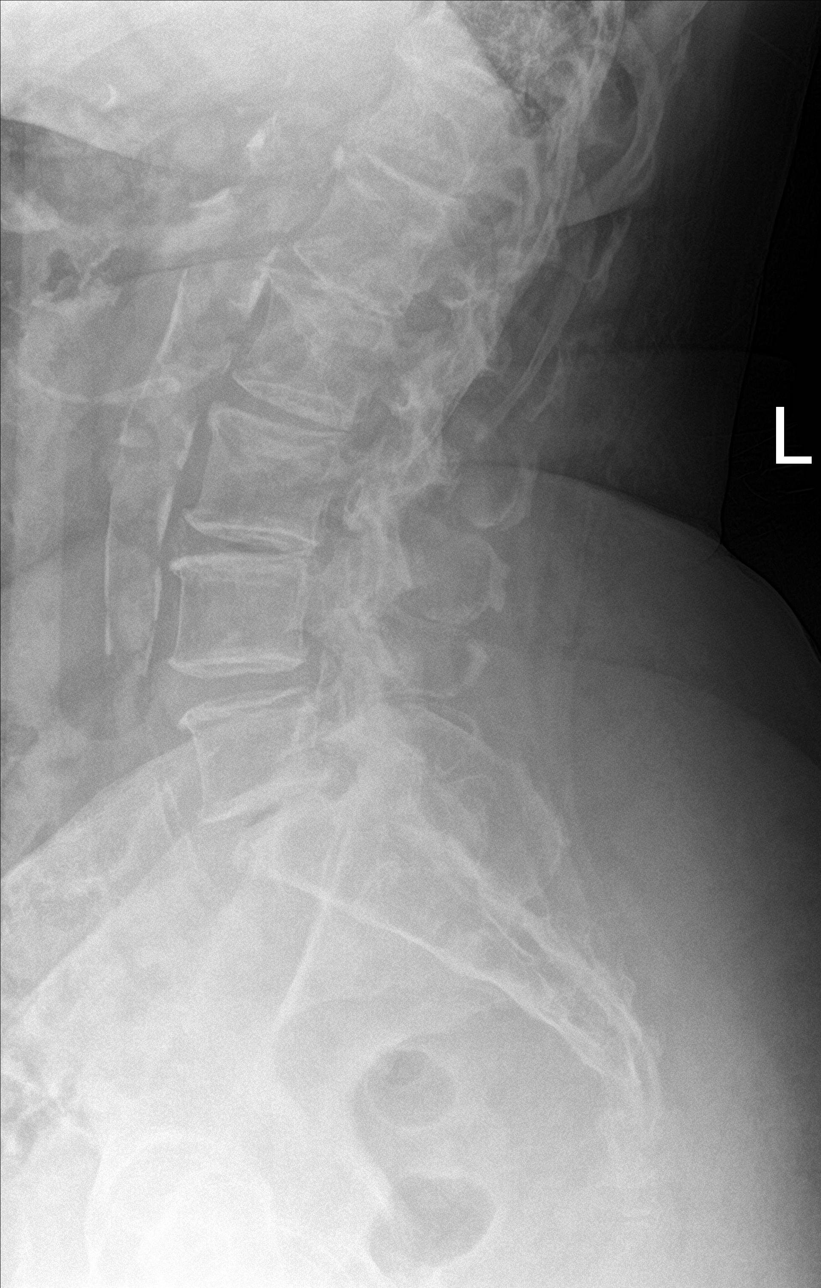

[l-spine spot]
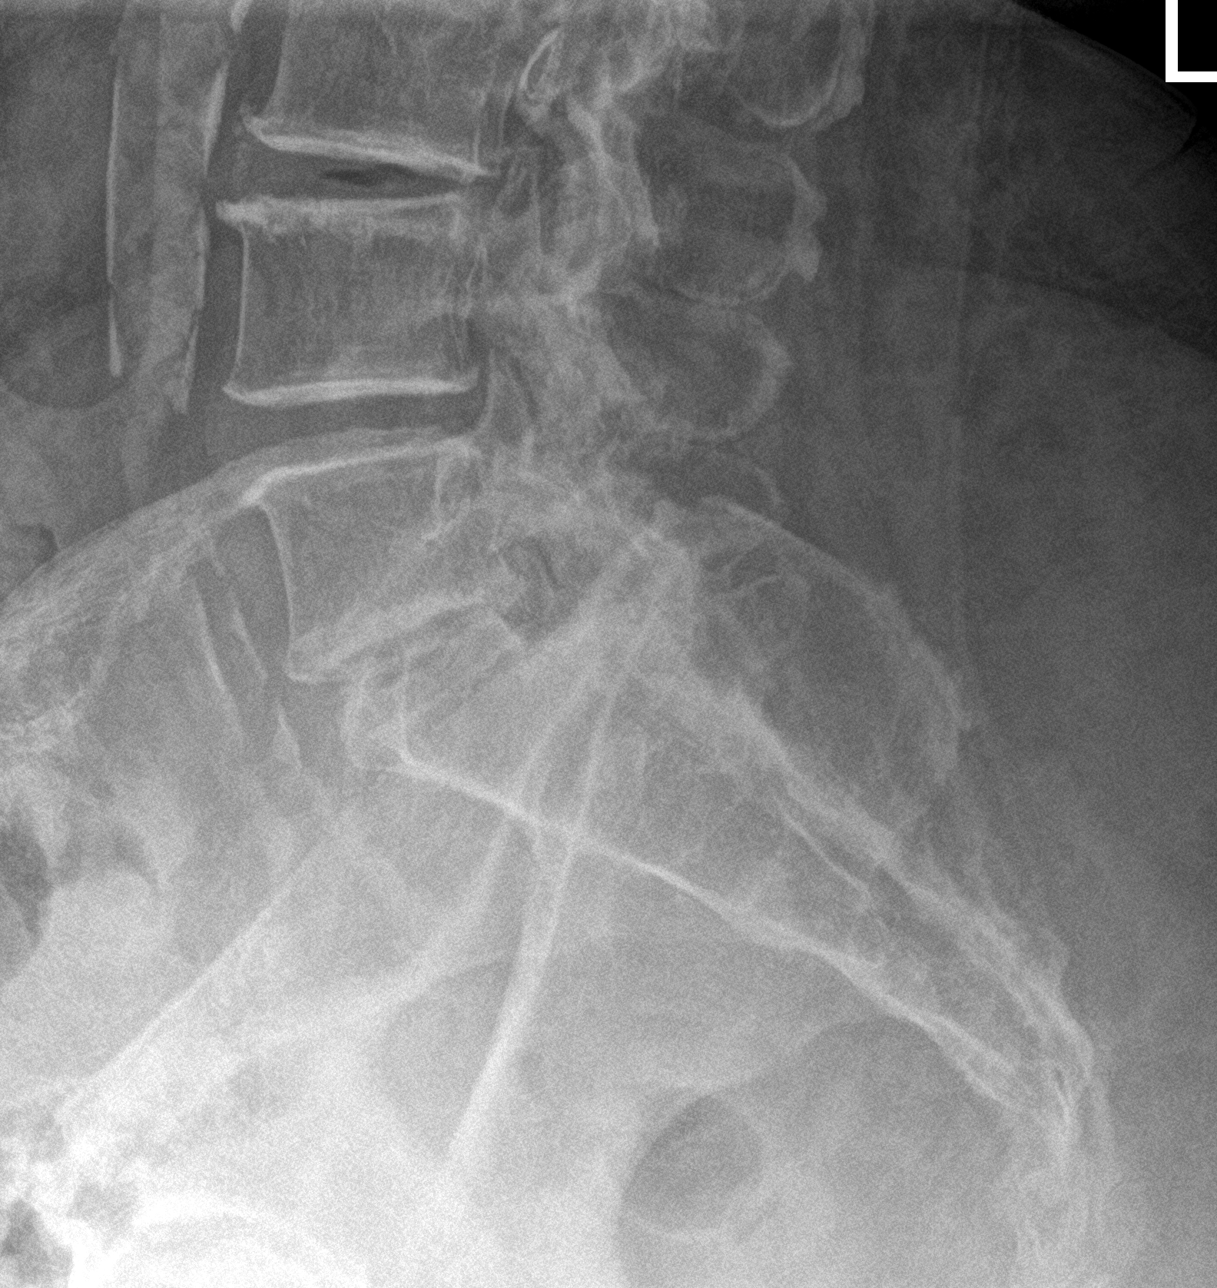

[5 of 5 positions shown; findings below may reference images not displayed]

FINDINGS: There is normal lumbar lordosis. No acute fracture or listhesis of
the lumbar spine. Vertebral body height has been preserved. There is
diffuse intervertebral disc space narrowing and endplate remodeling
throughout the lumbar spine in keeping with changes of mild to
moderate degenerative disc disease. Oblique views demonstrate no
evidence of pars defect. The paraspinal soft tissues are
unremarkable. Vascular calcifications are seen within the abdominal
aorta anterior to the lumbar spine.
IMPRESSION: No acute fracture or listhesis.  Diffuse degenerative disc disease.

## 2021-09-22 IMAGING — CT CT HEAD W/O CM
3 of 4 series · 13 of 47 positions shown, 15 images · non-contrast
Comparison: None.

CLINICAL DATA: Slipped and fell

EXAM:
CT HEAD WITHOUT CONTRAST
TECHNIQUE: Contiguous axial images were obtained from the base of the skull
through the vertex without intravenous contrast.

[Series 3: head wo · axial · 0.36mm/px · z∈[+1122,+1276]mm · 8 of 44 slices shown, 10 images]
[im 5/44  brain]
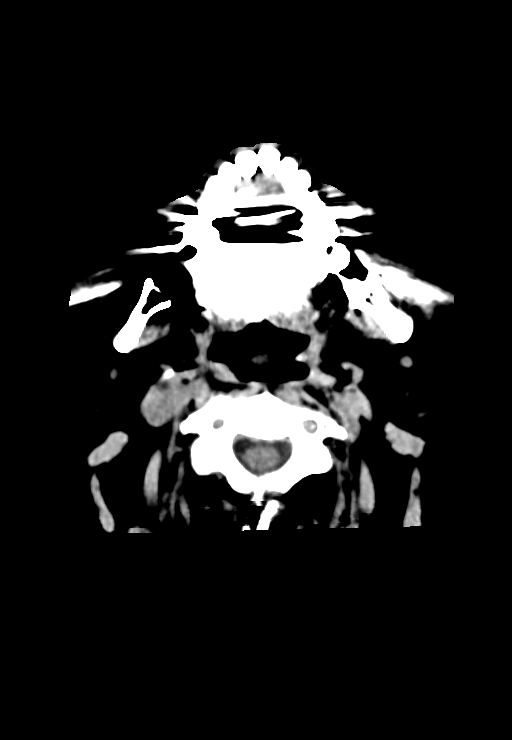
[im 5/44  bone]
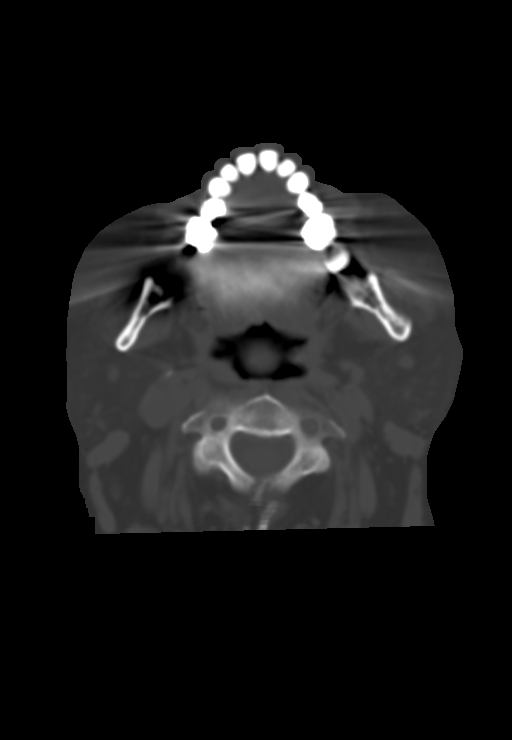
[im 10/44  brain]
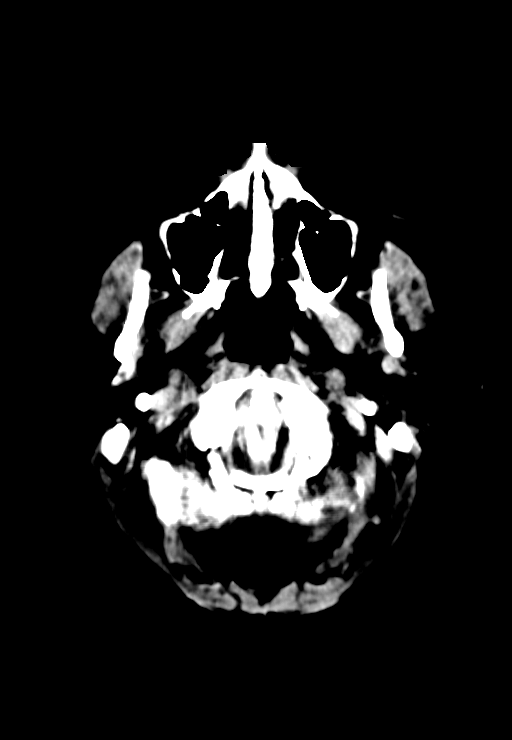
[im 15/44  brain]
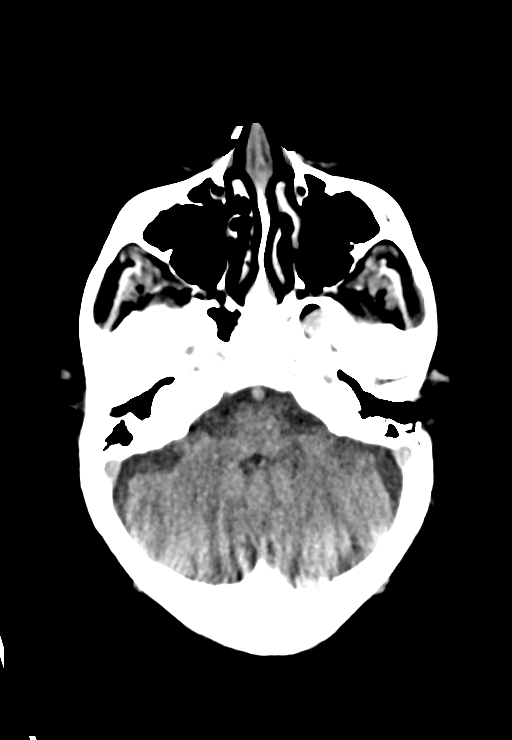
[im 20/44  brain]
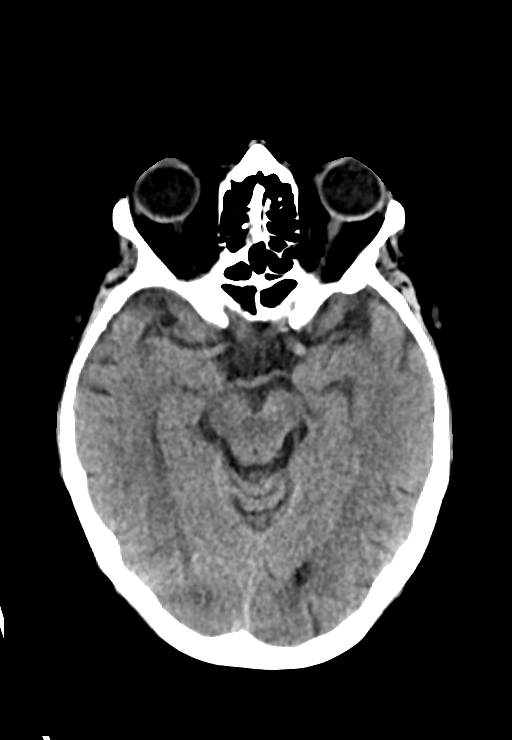
[im 24/44  brain]
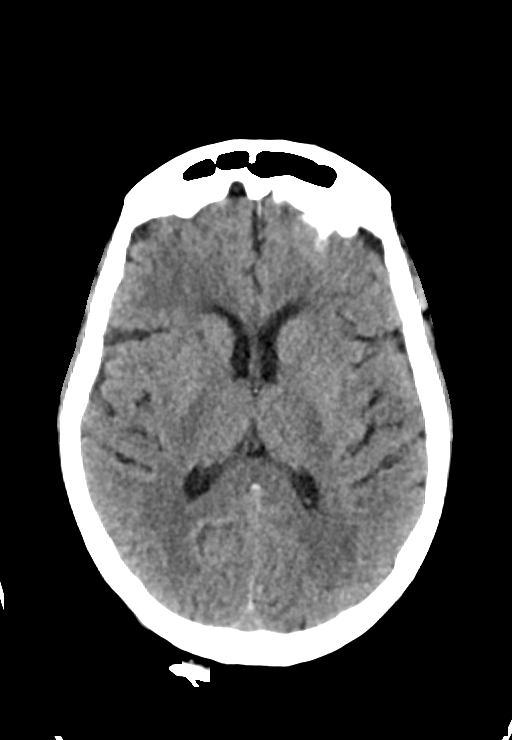
[im 24/44  bone]
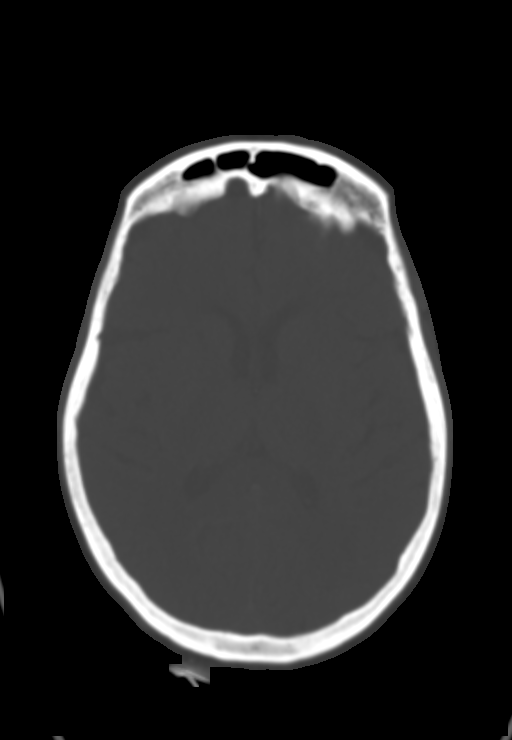
[im 29/44  brain]
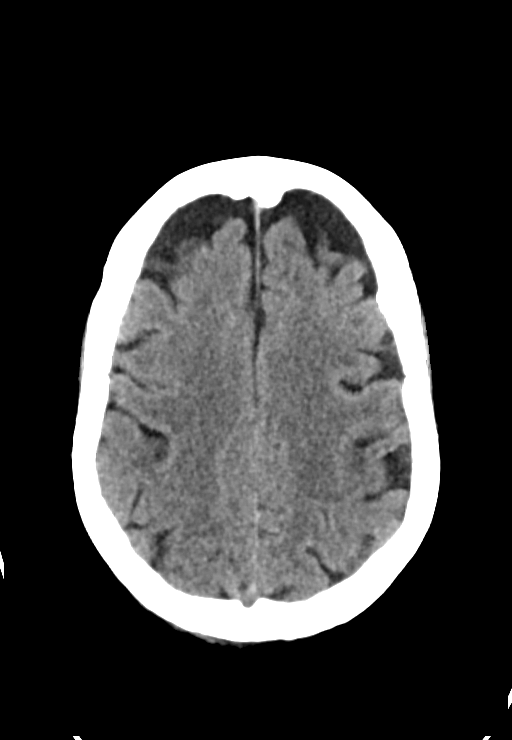
[im 34/44  brain]
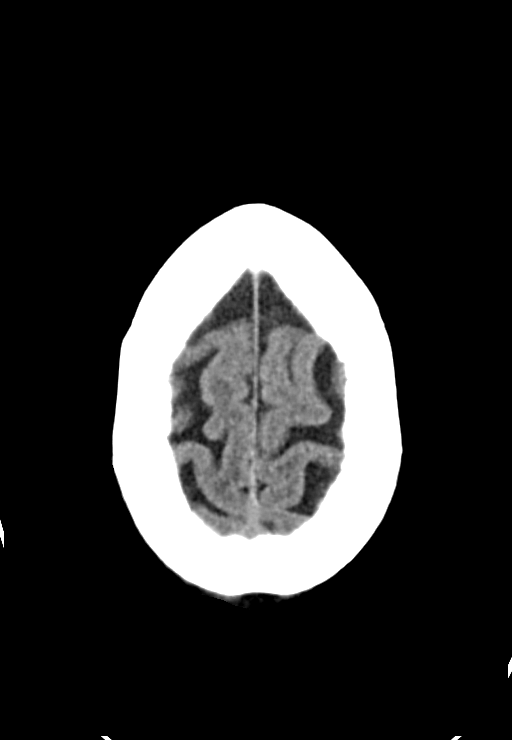
[im 39/44  brain]
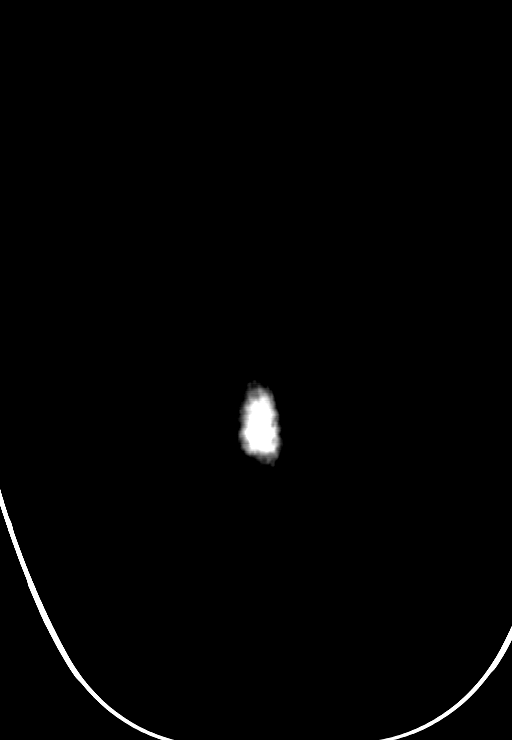

[Series 4: head bone · axial · 0.43mm/px · z∈[+1062,+1082]mm · 2 of 94 slices shown]
[im 10/94  bone]
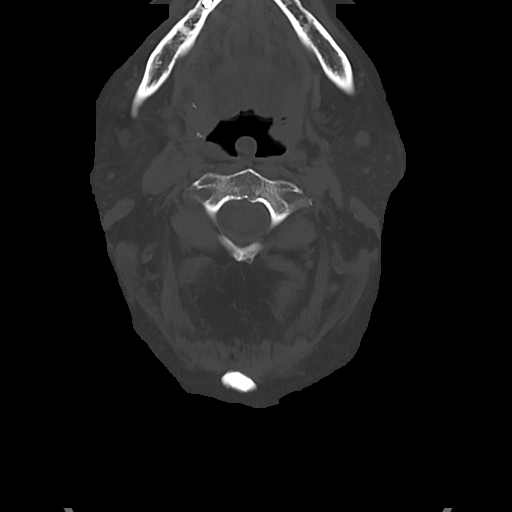
[im 20/94  bone]
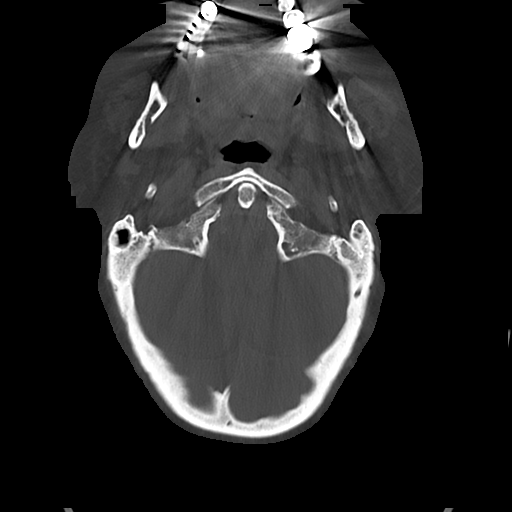

[Series 5: cor soft · coronal · 0.39mm/px · 3 of 85 slices shown]
[im 29/85  brain]
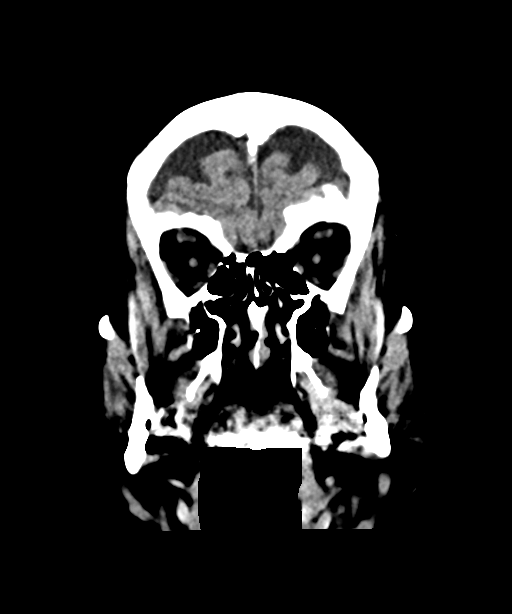
[im 38/85  brain]
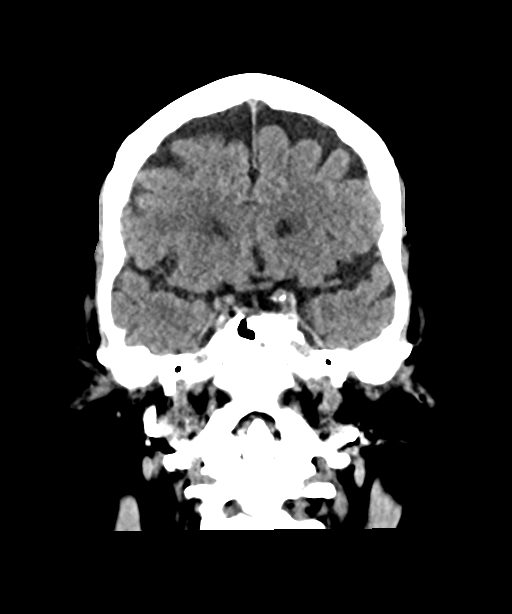
[im 47/85  brain]
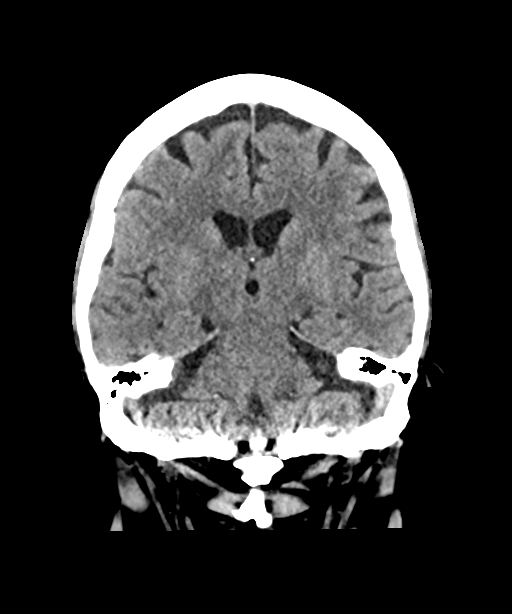

[13 of 47 positions shown; findings below may reference images not displayed]

FINDINGS: Brain: No acute infarct or hemorrhage. Lateral ventricles and
midline structures are unremarkable. No acute extra-axial fluid
collections. No mass effect.

Vascular: No hyperdense vessel or unexpected calcification.

Skull: Normal. Negative for fracture or focal lesion.

Sinuses/Orbits: No acute finding.

Other: None.
IMPRESSION: 1. No acute intracranial process.

## 2021-09-23 IMAGING — DX DG CHEST 1V
1 series · 1 of 1 positions shown · non-contrast
Comparison: 10/25/2019

CLINICAL DATA: Central line placement

EXAM:
CHEST  1 VIEW

[chest ap]
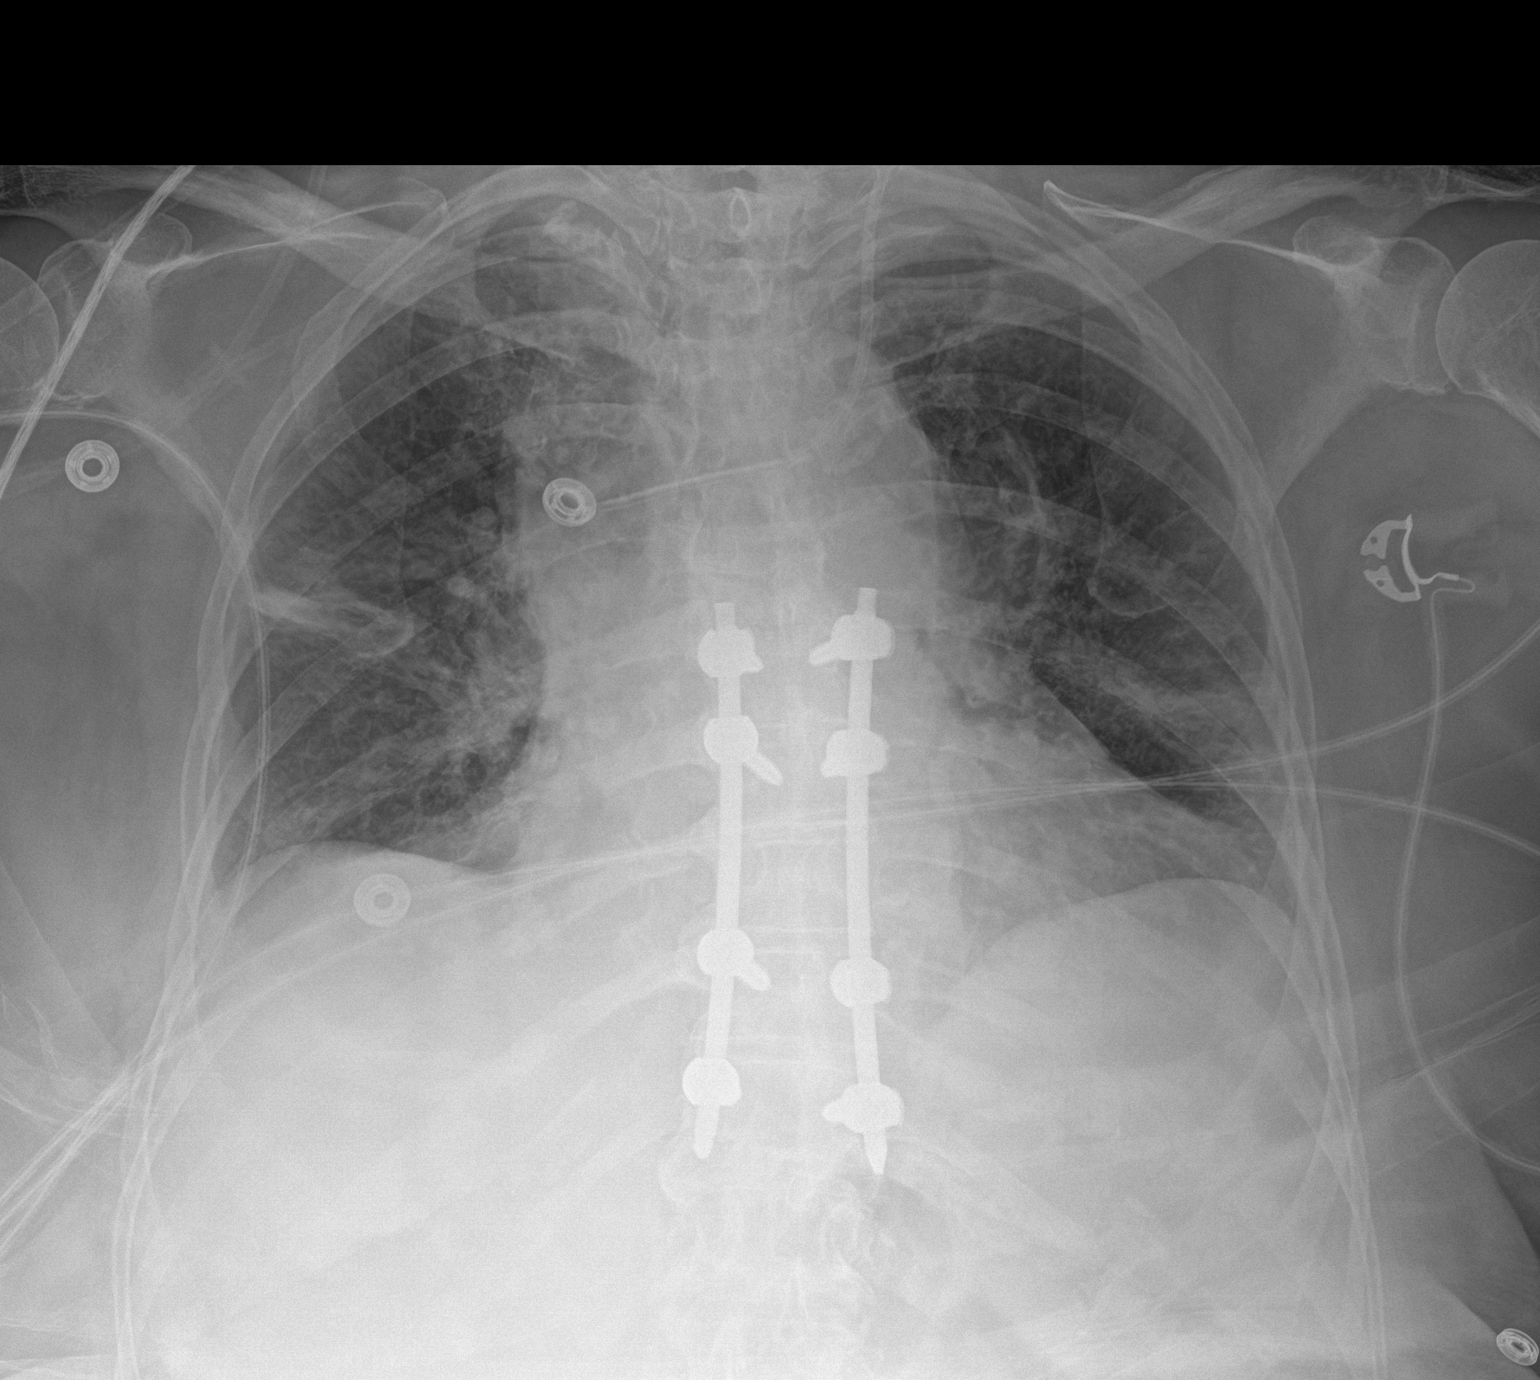

[1 of 1 positions shown; findings below may reference images not displayed]

FINDINGS: Left jugular central venous catheter tip in the mid SVC. No
pneumothorax.

Heart size prominent. Negative for heart failure or edema. Mild
atelectasis bilaterally. No effusion

Interval placement of thoracic spine fusion hardware in the mid and
lower thoracic spine.
IMPRESSION: Satisfactory central line placement

Mild bibasilar atelectasis.  Is

## 2022-05-08 DIAGNOSIS — E119 Type 2 diabetes mellitus without complications: Secondary | ICD-10-CM | POA: Diagnosis not present

## 2022-05-08 DIAGNOSIS — I10 Essential (primary) hypertension: Secondary | ICD-10-CM | POA: Diagnosis not present

## 2022-05-08 DIAGNOSIS — E785 Hyperlipidemia, unspecified: Secondary | ICD-10-CM | POA: Diagnosis not present

## 2022-05-08 DIAGNOSIS — D649 Anemia, unspecified: Secondary | ICD-10-CM | POA: Diagnosis not present

## 2022-05-08 DIAGNOSIS — Z7689 Persons encountering health services in other specified circumstances: Secondary | ICD-10-CM | POA: Diagnosis not present

## 2022-05-18 DIAGNOSIS — F324 Major depressive disorder, single episode, in partial remission: Secondary | ICD-10-CM | POA: Diagnosis not present

## 2022-05-18 DIAGNOSIS — N1831 Chronic kidney disease, stage 3a: Secondary | ICD-10-CM | POA: Diagnosis not present

## 2022-05-18 DIAGNOSIS — Z0000 Encounter for general adult medical examination without abnormal findings: Secondary | ICD-10-CM | POA: Diagnosis not present

## 2022-05-18 DIAGNOSIS — E785 Hyperlipidemia, unspecified: Secondary | ICD-10-CM | POA: Diagnosis not present

## 2022-05-18 DIAGNOSIS — M810 Age-related osteoporosis without current pathological fracture: Secondary | ICD-10-CM | POA: Diagnosis not present

## 2022-05-18 DIAGNOSIS — M1990 Unspecified osteoarthritis, unspecified site: Secondary | ICD-10-CM | POA: Diagnosis not present

## 2022-05-18 DIAGNOSIS — I10 Essential (primary) hypertension: Secondary | ICD-10-CM | POA: Diagnosis not present

## 2022-05-18 DIAGNOSIS — I700 Atherosclerosis of aorta: Secondary | ICD-10-CM | POA: Diagnosis not present

## 2022-05-18 DIAGNOSIS — E119 Type 2 diabetes mellitus without complications: Secondary | ICD-10-CM | POA: Diagnosis not present

## 2022-05-18 DIAGNOSIS — I5189 Other ill-defined heart diseases: Secondary | ICD-10-CM | POA: Diagnosis not present

## 2022-05-18 DIAGNOSIS — I480 Paroxysmal atrial fibrillation: Secondary | ICD-10-CM | POA: Diagnosis not present

## 2022-05-30 DIAGNOSIS — H0102B Squamous blepharitis left eye, upper and lower eyelids: Secondary | ICD-10-CM | POA: Diagnosis not present

## 2022-05-30 DIAGNOSIS — H04123 Dry eye syndrome of bilateral lacrimal glands: Secondary | ICD-10-CM | POA: Diagnosis not present

## 2022-05-30 DIAGNOSIS — E119 Type 2 diabetes mellitus without complications: Secondary | ICD-10-CM | POA: Diagnosis not present

## 2022-05-30 DIAGNOSIS — H02834 Dermatochalasis of left upper eyelid: Secondary | ICD-10-CM | POA: Diagnosis not present

## 2022-05-30 DIAGNOSIS — H0102A Squamous blepharitis right eye, upper and lower eyelids: Secondary | ICD-10-CM | POA: Diagnosis not present

## 2022-05-30 DIAGNOSIS — H40013 Open angle with borderline findings, low risk, bilateral: Secondary | ICD-10-CM | POA: Diagnosis not present

## 2022-05-30 DIAGNOSIS — H02831 Dermatochalasis of right upper eyelid: Secondary | ICD-10-CM | POA: Diagnosis not present

## 2022-05-30 DIAGNOSIS — H10413 Chronic giant papillary conjunctivitis, bilateral: Secondary | ICD-10-CM | POA: Diagnosis not present

## 2022-05-30 DIAGNOSIS — Z961 Presence of intraocular lens: Secondary | ICD-10-CM | POA: Diagnosis not present

## 2022-06-15 DIAGNOSIS — N3946 Mixed incontinence: Secondary | ICD-10-CM | POA: Diagnosis not present

## 2022-06-15 DIAGNOSIS — I5189 Other ill-defined heart diseases: Secondary | ICD-10-CM | POA: Diagnosis not present

## 2022-06-15 DIAGNOSIS — N1831 Chronic kidney disease, stage 3a: Secondary | ICD-10-CM | POA: Diagnosis not present

## 2022-06-15 DIAGNOSIS — I480 Paroxysmal atrial fibrillation: Secondary | ICD-10-CM | POA: Diagnosis not present

## 2022-06-15 DIAGNOSIS — I8390 Asymptomatic varicose veins of unspecified lower extremity: Secondary | ICD-10-CM | POA: Diagnosis not present

## 2022-06-15 DIAGNOSIS — E119 Type 2 diabetes mellitus without complications: Secondary | ICD-10-CM | POA: Diagnosis not present

## 2022-06-15 DIAGNOSIS — R600 Localized edema: Secondary | ICD-10-CM | POA: Diagnosis not present

## 2022-10-11 DIAGNOSIS — F0150 Vascular dementia without behavioral disturbance: Secondary | ICD-10-CM | POA: Diagnosis not present

## 2022-10-11 DIAGNOSIS — Z8673 Personal history of transient ischemic attack (TIA), and cerebral infarction without residual deficits: Secondary | ICD-10-CM | POA: Diagnosis not present

## 2022-11-23 DIAGNOSIS — Z0000 Encounter for general adult medical examination without abnormal findings: Secondary | ICD-10-CM | POA: Diagnosis not present

## 2022-11-23 DIAGNOSIS — Z0189 Encounter for other specified special examinations: Secondary | ICD-10-CM | POA: Diagnosis not present

## 2022-11-23 DIAGNOSIS — F324 Major depressive disorder, single episode, in partial remission: Secondary | ICD-10-CM | POA: Diagnosis not present

## 2022-11-23 DIAGNOSIS — E1122 Type 2 diabetes mellitus with diabetic chronic kidney disease: Secondary | ICD-10-CM | POA: Diagnosis not present

## 2022-11-23 DIAGNOSIS — Z9181 History of falling: Secondary | ICD-10-CM | POA: Diagnosis not present

## 2022-11-23 DIAGNOSIS — I10 Essential (primary) hypertension: Secondary | ICD-10-CM | POA: Diagnosis not present

## 2022-11-23 DIAGNOSIS — I5189 Other ill-defined heart diseases: Secondary | ICD-10-CM | POA: Diagnosis not present

## 2022-11-23 DIAGNOSIS — N1831 Chronic kidney disease, stage 3a: Secondary | ICD-10-CM | POA: Diagnosis not present

## 2022-11-23 DIAGNOSIS — I700 Atherosclerosis of aorta: Secondary | ICD-10-CM | POA: Diagnosis not present

## 2022-11-23 DIAGNOSIS — E785 Hyperlipidemia, unspecified: Secondary | ICD-10-CM | POA: Diagnosis not present

## 2022-11-23 DIAGNOSIS — R600 Localized edema: Secondary | ICD-10-CM | POA: Diagnosis not present

## 2022-11-23 DIAGNOSIS — Z23 Encounter for immunization: Secondary | ICD-10-CM | POA: Diagnosis not present

## 2022-11-23 DIAGNOSIS — I480 Paroxysmal atrial fibrillation: Secondary | ICD-10-CM | POA: Diagnosis not present

## 2023-05-15 DIAGNOSIS — E1165 Type 2 diabetes mellitus with hyperglycemia: Secondary | ICD-10-CM | POA: Diagnosis not present

## 2023-05-15 DIAGNOSIS — D649 Anemia, unspecified: Secondary | ICD-10-CM | POA: Diagnosis not present

## 2023-05-15 DIAGNOSIS — N1831 Chronic kidney disease, stage 3a: Secondary | ICD-10-CM | POA: Diagnosis not present

## 2023-05-15 DIAGNOSIS — E1122 Type 2 diabetes mellitus with diabetic chronic kidney disease: Secondary | ICD-10-CM | POA: Diagnosis not present

## 2023-05-15 DIAGNOSIS — I1310 Hypertensive heart and chronic kidney disease without heart failure, with stage 1 through stage 4 chronic kidney disease, or unspecified chronic kidney disease: Secondary | ICD-10-CM | POA: Diagnosis not present

## 2023-05-15 DIAGNOSIS — I480 Paroxysmal atrial fibrillation: Secondary | ICD-10-CM | POA: Diagnosis not present

## 2023-05-15 DIAGNOSIS — E559 Vitamin D deficiency, unspecified: Secondary | ICD-10-CM | POA: Diagnosis not present

## 2023-05-15 DIAGNOSIS — E785 Hyperlipidemia, unspecified: Secondary | ICD-10-CM | POA: Diagnosis not present

## 2023-05-15 DIAGNOSIS — M810 Age-related osteoporosis without current pathological fracture: Secondary | ICD-10-CM | POA: Diagnosis not present

## 2023-05-29 DIAGNOSIS — I700 Atherosclerosis of aorta: Secondary | ICD-10-CM | POA: Diagnosis not present

## 2023-05-29 DIAGNOSIS — E1122 Type 2 diabetes mellitus with diabetic chronic kidney disease: Secondary | ICD-10-CM | POA: Diagnosis not present

## 2023-05-29 DIAGNOSIS — Z1331 Encounter for screening for depression: Secondary | ICD-10-CM | POA: Diagnosis not present

## 2023-05-29 DIAGNOSIS — I480 Paroxysmal atrial fibrillation: Secondary | ICD-10-CM | POA: Diagnosis not present

## 2023-05-29 DIAGNOSIS — Z9181 History of falling: Secondary | ICD-10-CM | POA: Diagnosis not present

## 2023-05-29 DIAGNOSIS — E1165 Type 2 diabetes mellitus with hyperglycemia: Secondary | ICD-10-CM | POA: Diagnosis not present

## 2023-05-29 DIAGNOSIS — F324 Major depressive disorder, single episode, in partial remission: Secondary | ICD-10-CM | POA: Diagnosis not present

## 2023-05-29 DIAGNOSIS — M810 Age-related osteoporosis without current pathological fracture: Secondary | ICD-10-CM | POA: Diagnosis not present

## 2023-05-29 DIAGNOSIS — Z6838 Body mass index (BMI) 38.0-38.9, adult: Secondary | ICD-10-CM | POA: Diagnosis not present

## 2023-05-29 DIAGNOSIS — Z0000 Encounter for general adult medical examination without abnormal findings: Secondary | ICD-10-CM | POA: Diagnosis not present

## 2023-05-29 DIAGNOSIS — I10 Essential (primary) hypertension: Secondary | ICD-10-CM | POA: Diagnosis not present

## 2023-05-29 DIAGNOSIS — Z1389 Encounter for screening for other disorder: Secondary | ICD-10-CM | POA: Diagnosis not present

## 2023-05-29 DIAGNOSIS — I1310 Hypertensive heart and chronic kidney disease without heart failure, with stage 1 through stage 4 chronic kidney disease, or unspecified chronic kidney disease: Secondary | ICD-10-CM | POA: Diagnosis not present

## 2023-05-29 DIAGNOSIS — N1831 Chronic kidney disease, stage 3a: Secondary | ICD-10-CM | POA: Diagnosis not present

## 2023-05-29 DIAGNOSIS — R82998 Other abnormal findings in urine: Secondary | ICD-10-CM | POA: Diagnosis not present

## 2023-05-29 DIAGNOSIS — Z23 Encounter for immunization: Secondary | ICD-10-CM | POA: Diagnosis not present

## 2023-08-16 DIAGNOSIS — H02831 Dermatochalasis of right upper eyelid: Secondary | ICD-10-CM | POA: Diagnosis not present

## 2023-08-16 DIAGNOSIS — H0102A Squamous blepharitis right eye, upper and lower eyelids: Secondary | ICD-10-CM | POA: Diagnosis not present

## 2023-08-16 DIAGNOSIS — E119 Type 2 diabetes mellitus without complications: Secondary | ICD-10-CM | POA: Diagnosis not present

## 2023-08-16 DIAGNOSIS — H04123 Dry eye syndrome of bilateral lacrimal glands: Secondary | ICD-10-CM | POA: Diagnosis not present

## 2023-08-16 DIAGNOSIS — H10413 Chronic giant papillary conjunctivitis, bilateral: Secondary | ICD-10-CM | POA: Diagnosis not present

## 2023-08-16 DIAGNOSIS — H02834 Dermatochalasis of left upper eyelid: Secondary | ICD-10-CM | POA: Diagnosis not present

## 2023-08-16 DIAGNOSIS — Z961 Presence of intraocular lens: Secondary | ICD-10-CM | POA: Diagnosis not present

## 2023-08-16 DIAGNOSIS — H40013 Open angle with borderline findings, low risk, bilateral: Secondary | ICD-10-CM | POA: Diagnosis not present

## 2023-08-16 DIAGNOSIS — H0102B Squamous blepharitis left eye, upper and lower eyelids: Secondary | ICD-10-CM | POA: Diagnosis not present

## 2023-11-29 DIAGNOSIS — I700 Atherosclerosis of aorta: Secondary | ICD-10-CM | POA: Diagnosis not present

## 2023-11-29 DIAGNOSIS — I10 Essential (primary) hypertension: Secondary | ICD-10-CM | POA: Diagnosis not present

## 2023-11-29 DIAGNOSIS — N1831 Chronic kidney disease, stage 3a: Secondary | ICD-10-CM | POA: Diagnosis not present

## 2023-11-29 DIAGNOSIS — E785 Hyperlipidemia, unspecified: Secondary | ICD-10-CM | POA: Diagnosis not present

## 2023-11-29 DIAGNOSIS — I5189 Other ill-defined heart diseases: Secondary | ICD-10-CM | POA: Diagnosis not present

## 2023-11-29 DIAGNOSIS — E1165 Type 2 diabetes mellitus with hyperglycemia: Secondary | ICD-10-CM | POA: Diagnosis not present

## 2023-11-29 DIAGNOSIS — I480 Paroxysmal atrial fibrillation: Secondary | ICD-10-CM | POA: Diagnosis not present

## 2023-11-29 DIAGNOSIS — R600 Localized edema: Secondary | ICD-10-CM | POA: Diagnosis not present

## 2023-11-29 DIAGNOSIS — Z23 Encounter for immunization: Secondary | ICD-10-CM | POA: Diagnosis not present

## 2023-11-29 DIAGNOSIS — M810 Age-related osteoporosis without current pathological fracture: Secondary | ICD-10-CM | POA: Diagnosis not present

## 2023-11-29 DIAGNOSIS — E1122 Type 2 diabetes mellitus with diabetic chronic kidney disease: Secondary | ICD-10-CM | POA: Diagnosis not present

## 2023-11-29 DIAGNOSIS — F324 Major depressive disorder, single episode, in partial remission: Secondary | ICD-10-CM | POA: Diagnosis not present
